# Patient Record
Sex: Female | Born: 1960
Health system: Southern US, Community
[De-identification: ages and names within clinical notes are randomized; demographics above are authoritative.]

## PROBLEM LIST (undated history)

## (undated) DIAGNOSIS — F419 Anxiety disorder, unspecified: Secondary | ICD-10-CM

## (undated) DIAGNOSIS — B001 Herpesviral vesicular dermatitis: Secondary | ICD-10-CM

## (undated) DIAGNOSIS — Z9109 Other allergy status, other than to drugs and biological substances: Secondary | ICD-10-CM

## (undated) DIAGNOSIS — M5126 Other intervertebral disc displacement, lumbar region: Secondary | ICD-10-CM

## (undated) DIAGNOSIS — L509 Urticaria, unspecified: Secondary | ICD-10-CM

## (undated) DIAGNOSIS — Z9049 Acquired absence of other specified parts of digestive tract: Secondary | ICD-10-CM

## (undated) DIAGNOSIS — F329 Major depressive disorder, single episode, unspecified: Secondary | ICD-10-CM

## (undated) DIAGNOSIS — F410 Panic disorder [episodic paroxysmal anxiety] without agoraphobia: Secondary | ICD-10-CM

## (undated) DIAGNOSIS — F32A Depression, unspecified: Secondary | ICD-10-CM

## (undated) DIAGNOSIS — Z9889 Other specified postprocedural states: Secondary | ICD-10-CM

## (undated) DIAGNOSIS — K439 Ventral hernia without obstruction or gangrene: Secondary | ICD-10-CM

## (undated) DIAGNOSIS — R32 Unspecified urinary incontinence: Secondary | ICD-10-CM

## (undated) DIAGNOSIS — Z8719 Personal history of other diseases of the digestive system: Secondary | ICD-10-CM

## (undated) DIAGNOSIS — R1310 Dysphagia, unspecified: Secondary | ICD-10-CM

## (undated) DIAGNOSIS — J45909 Unspecified asthma, uncomplicated: Secondary | ICD-10-CM

## (undated) HISTORY — DX: Unspecified asthma, uncomplicated: J45.909

## (undated) HISTORY — DX: Ventral hernia without obstruction or gangrene: K43.9

## (undated) HISTORY — DX: Urticaria, unspecified: L50.9

## (undated) HISTORY — DX: Other allergy status, other than to drugs and biological substances: Z91.09

## (undated) HISTORY — DX: Other specified postprocedural states: Z98.890

## (undated) HISTORY — DX: Anxiety disorder, unspecified: F41.9

## (undated) HISTORY — PX: DILATION AND CURETTAGE OF UTERUS: SHX78

## (undated) HISTORY — DX: Acquired absence of other specified parts of digestive tract: Z90.49

## (undated) HISTORY — PX: HYSTEROSCOPY: SHX211

## (undated) HISTORY — DX: Herpesviral vesicular dermatitis: B00.1

## (undated) HISTORY — DX: Unspecified urinary incontinence: R32

---

## 1999-01-06 ENCOUNTER — Ambulatory Visit (HOSPITAL_COMMUNITY): Admission: RE | Admit: 1999-01-06 | Discharge: 1999-01-06 | Payer: Self-pay | Admitting: Obstetrics and Gynecology

## 1999-03-30 ENCOUNTER — Ambulatory Visit (HOSPITAL_COMMUNITY): Admission: RE | Admit: 1999-03-30 | Discharge: 1999-03-30 | Payer: Self-pay | Admitting: Gynecology

## 1999-03-30 ENCOUNTER — Encounter (INDEPENDENT_AMBULATORY_CARE_PROVIDER_SITE_OTHER): Payer: Self-pay

## 1999-07-09 ENCOUNTER — Encounter: Payer: Self-pay | Admitting: Gynecology

## 1999-07-09 ENCOUNTER — Inpatient Hospital Stay (HOSPITAL_COMMUNITY): Admission: AD | Admit: 1999-07-09 | Discharge: 1999-07-09 | Payer: Self-pay | Admitting: Gynecology

## 1999-07-19 ENCOUNTER — Ambulatory Visit (HOSPITAL_COMMUNITY): Admission: RE | Admit: 1999-07-19 | Discharge: 1999-07-19 | Payer: Self-pay | Admitting: Gynecology

## 1999-07-19 ENCOUNTER — Encounter (INDEPENDENT_AMBULATORY_CARE_PROVIDER_SITE_OTHER): Payer: Self-pay

## 1999-08-15 HISTORY — PX: ABDOMINAL SURGERY: SHX537

## 2000-01-26 ENCOUNTER — Encounter: Admission: RE | Admit: 2000-01-26 | Discharge: 2000-01-26 | Payer: Self-pay | Admitting: Urology

## 2000-01-26 ENCOUNTER — Encounter: Payer: Self-pay | Admitting: Urology

## 2000-04-02 ENCOUNTER — Encounter: Admission: RE | Admit: 2000-04-02 | Discharge: 2000-04-02 | Payer: Self-pay | Admitting: *Deleted

## 2000-04-02 ENCOUNTER — Encounter: Payer: Self-pay | Admitting: *Deleted

## 2000-04-05 ENCOUNTER — Encounter: Payer: Self-pay | Admitting: Cardiology

## 2000-04-05 ENCOUNTER — Encounter: Payer: Self-pay | Admitting: *Deleted

## 2000-04-05 ENCOUNTER — Encounter: Admission: RE | Admit: 2000-04-05 | Discharge: 2000-04-05 | Payer: Self-pay | Admitting: Cardiology

## 2000-05-15 ENCOUNTER — Encounter (INDEPENDENT_AMBULATORY_CARE_PROVIDER_SITE_OTHER): Payer: Self-pay | Admitting: Specialist

## 2000-05-15 ENCOUNTER — Inpatient Hospital Stay (HOSPITAL_COMMUNITY): Admission: RE | Admit: 2000-05-15 | Discharge: 2000-05-17 | Payer: Self-pay | Admitting: Gynecology

## 2000-12-18 ENCOUNTER — Other Ambulatory Visit: Admission: RE | Admit: 2000-12-18 | Discharge: 2000-12-18 | Payer: Self-pay | Admitting: Gynecology

## 2001-08-14 HISTORY — PX: BLADDER SUSPENSION: SHX72

## 2003-06-03 ENCOUNTER — Other Ambulatory Visit: Admission: RE | Admit: 2003-06-03 | Discharge: 2003-06-03 | Payer: Self-pay | Admitting: Gynecology

## 2003-11-26 ENCOUNTER — Encounter (INDEPENDENT_AMBULATORY_CARE_PROVIDER_SITE_OTHER): Payer: Self-pay | Admitting: Specialist

## 2003-11-26 ENCOUNTER — Ambulatory Visit (HOSPITAL_BASED_OUTPATIENT_CLINIC_OR_DEPARTMENT_OTHER): Admission: RE | Admit: 2003-11-26 | Discharge: 2003-11-26 | Payer: Self-pay | Admitting: Gynecology

## 2003-11-26 ENCOUNTER — Ambulatory Visit (HOSPITAL_COMMUNITY): Admission: RE | Admit: 2003-11-26 | Discharge: 2003-11-26 | Payer: Self-pay | Admitting: Gynecology

## 2004-06-14 ENCOUNTER — Other Ambulatory Visit: Admission: RE | Admit: 2004-06-14 | Discharge: 2004-06-14 | Payer: Self-pay | Admitting: Gynecology

## 2005-06-27 ENCOUNTER — Other Ambulatory Visit: Admission: RE | Admit: 2005-06-27 | Discharge: 2005-06-27 | Payer: Self-pay | Admitting: Gynecology

## 2006-03-21 ENCOUNTER — Emergency Department: Payer: Self-pay | Admitting: Emergency Medicine

## 2006-07-23 ENCOUNTER — Other Ambulatory Visit: Admission: RE | Admit: 2006-07-23 | Discharge: 2006-07-23 | Payer: Self-pay | Admitting: Gynecology

## 2006-10-11 ENCOUNTER — Ambulatory Visit (HOSPITAL_BASED_OUTPATIENT_CLINIC_OR_DEPARTMENT_OTHER): Admission: RE | Admit: 2006-10-11 | Discharge: 2006-10-11 | Payer: Self-pay | Admitting: Gynecology

## 2006-10-11 ENCOUNTER — Encounter (INDEPENDENT_AMBULATORY_CARE_PROVIDER_SITE_OTHER): Payer: Self-pay | Admitting: Specialist

## 2007-07-26 ENCOUNTER — Other Ambulatory Visit: Admission: RE | Admit: 2007-07-26 | Discharge: 2007-07-26 | Payer: Self-pay | Admitting: Gynecology

## 2007-10-14 ENCOUNTER — Ambulatory Visit: Payer: Self-pay | Admitting: Internal Medicine

## 2008-04-30 ENCOUNTER — Ambulatory Visit: Payer: Self-pay | Admitting: Internal Medicine

## 2008-07-27 ENCOUNTER — Other Ambulatory Visit: Admission: RE | Admit: 2008-07-27 | Discharge: 2008-07-27 | Payer: Self-pay | Admitting: Gynecology

## 2008-07-27 ENCOUNTER — Ambulatory Visit: Payer: Self-pay | Admitting: Gynecology

## 2008-07-27 ENCOUNTER — Encounter: Payer: Self-pay | Admitting: Gynecology

## 2009-02-10 ENCOUNTER — Ambulatory Visit: Payer: Self-pay | Admitting: Internal Medicine

## 2009-07-28 ENCOUNTER — Ambulatory Visit: Payer: Self-pay | Admitting: Gynecology

## 2009-07-28 ENCOUNTER — Other Ambulatory Visit: Admission: RE | Admit: 2009-07-28 | Discharge: 2009-07-28 | Payer: Self-pay | Admitting: Gynecology

## 2009-09-17 ENCOUNTER — Ambulatory Visit: Payer: Self-pay | Admitting: Gynecology

## 2010-01-19 ENCOUNTER — Emergency Department: Payer: Self-pay | Admitting: Emergency Medicine

## 2010-08-01 ENCOUNTER — Ambulatory Visit: Payer: Self-pay | Admitting: Gynecology

## 2010-08-01 ENCOUNTER — Other Ambulatory Visit
Admission: RE | Admit: 2010-08-01 | Discharge: 2010-08-01 | Payer: Self-pay | Source: Home / Self Care | Admitting: Gynecology

## 2010-12-07 ENCOUNTER — Other Ambulatory Visit: Payer: Self-pay | Admitting: Gynecology

## 2010-12-07 DIAGNOSIS — Z1231 Encounter for screening mammogram for malignant neoplasm of breast: Secondary | ICD-10-CM

## 2010-12-13 ENCOUNTER — Ambulatory Visit
Admission: RE | Admit: 2010-12-13 | Discharge: 2010-12-13 | Disposition: A | Discharge status: Home / Self Care | Payer: Commercial Indemnity | Source: Ambulatory Visit | Attending: Gynecology | Admitting: Gynecology

## 2010-12-13 DIAGNOSIS — Z1231 Encounter for screening mammogram for malignant neoplasm of breast: Secondary | ICD-10-CM

## 2010-12-14 ENCOUNTER — Other Ambulatory Visit: Payer: Self-pay | Admitting: Gynecology

## 2010-12-14 DIAGNOSIS — R928 Other abnormal and inconclusive findings on diagnostic imaging of breast: Secondary | ICD-10-CM

## 2010-12-20 ENCOUNTER — Other Ambulatory Visit: Payer: Self-pay | Admitting: Gynecology

## 2010-12-20 ENCOUNTER — Ambulatory Visit
Admission: RE | Admit: 2010-12-20 | Discharge: 2010-12-20 | Disposition: A | Discharge status: Home / Self Care | Payer: Commercial Indemnity | Source: Ambulatory Visit | Attending: Gynecology | Admitting: Gynecology

## 2010-12-20 DIAGNOSIS — R928 Other abnormal and inconclusive findings on diagnostic imaging of breast: Secondary | ICD-10-CM

## 2010-12-21 ENCOUNTER — Ambulatory Visit
Admission: RE | Admit: 2010-12-21 | Discharge: 2010-12-21 | Disposition: A | Discharge status: Home / Self Care | Payer: Commercial Indemnity | Source: Ambulatory Visit | Attending: Gynecology | Admitting: Gynecology

## 2010-12-21 ENCOUNTER — Other Ambulatory Visit: Payer: Self-pay | Admitting: Gynecology

## 2010-12-21 ENCOUNTER — Other Ambulatory Visit: Payer: Self-pay | Admitting: Radiology

## 2010-12-21 DIAGNOSIS — R928 Other abnormal and inconclusive findings on diagnostic imaging of breast: Secondary | ICD-10-CM

## 2010-12-21 DIAGNOSIS — N6310 Unspecified lump in the right breast, unspecified quadrant: Secondary | ICD-10-CM

## 2010-12-21 HISTORY — PX: BREAST BIOPSY: SHX20

## 2010-12-30 NOTE — Op Note (Signed)
[facility]  Patient:    Hannah [name], Hannah [name]                     MRN: [id number] Proc. Date: 05/15/00 Adm. Date:  [id number] Attending:  Merrily Pew                           Operative Report  PREOPERATIVE DIAGNOSIS:       Leiomyomata.  POSTOPERATIVE DIAGNOSIS:      Leiomyomata.  OPERATION:                    Multiple myomectomy.  SURGEON:                      Timothy P. Fontaine, M.D.  ASSISTANT:                    Douglass Rivers, M.D.  ANESTHESIA:                   General endotracheal anesthesia.  ESTIMATED BLOOD LOSS:         Less than 50 cc.  COMPLICATIONS:                None.  SPECIMEN:                     Leiomyomata x 3.  FINDINGS:                     Anterior cul-de-sac normal.  Posterior cul-de-sac normal.  Uterus overall normal size.  Large fundal pedunculated myoma 9 to 10 cm.  A 3 to 4 cm left broad ligament.  Myoma inferior to fallopian tube.  Small, 1 to 2 cm, pedunculated posterior myoma.  Right and left fallopian tubes normal length, caliber and fimbriated ends.  Right and left ovaries grossly normal, free and mobile.  No evidence of pelvic adhesions or endometriosis.  DESCRIPTION OF PROCEDURE:     The patient was taken to the operating room and underwent general endotracheal anesthesia.  She received an abdominal preparation with Betadine scrub and Betadine solution and a Foley catheter was placed in sterile technique.  The patient was draped in the usual fashion and the abdomen was sharply entered through a Pfannenstiel incision, achieving adequate hemostasis at all levels.  The Balfour retractor and bladder blade were placed within the incision and the intestines were packed from the operative field.  The uterus was elevated out of the pelvis and the large fundal pedunculated myoma was then injected into the myoma stock using vasopressin mixture in 10 units per 20 cc dilution.  Subsequently the overlying serosa  was sharply incised and through sharp and blunt dissection, the myoma was excised.  The uterine defect was then closed using 0 Vicryl interrupted sutures to reapproximated the myometrium and subsequent 2-0 PDS suture in a running imbricating suture to reapproximate the serosa.  The left broad ligament myoma was then exposed underlying the round ligament and inferior to the fallopian tube insertion.  The anterior lower surface was infiltrated using the same vasopressin mixture and subsequently the overlying peritoneum was sharply incised.  Through sharp and blunt dissection, the myoma was then delivered without difficulty and several bleeders were dressed with interrupted 0 Vicryl stitch.  Subsequently the overlying peritoneum was reapproximated using 3-0 PDS in a running imbricating stitch. The small posterior myoma was then tented  and excised with electrocautery requiring no sutures.  The pelvis was copiously irrigated.  reinspection of all sites showed adequate hemostasis.  Bowel packing removed. Balfour retractor and bladder blade were removed and the anterior fascia reapproximated using 0 Vicryl in a running stitch starting at the angle and meeting in the middle. Subcutaneous tissues were irrigated.  Hemostasis was achieved with electrocautery and the skin was subsequently reapproximated using 4-0 Vicryl in a running subcuticular stitch.  Steri-Strips with benzoin was then applied. Sterile dressing applied.  The patient was awakened without difficulty and was taken to the recovery room in good condition having tolerated the procedure well.  Of note, all the dissections were far from the tubal insertions and the endometrial cavity was not entered in any of the dissections. DD:  05/15/00 TD:  05/15/00 Job: [id number]

## 2010-12-30 NOTE — H&P (Signed)
[facility] of [facility]  Patient:    Hannah [name], Hannah [name]                       MRN: [id number] Adm. Date:  05/15/00 Attending:  Marcial Pacas P. Fontaine, M.D.                         History and Physical  CHIEF COMPLAINT:                Leiomyomata.  HISTORY OF PRESENT ILLNESS:     [age]-year-old G4, P1, AB3 female with a history of symptomatic leiomyomata.  The patient has a history of increasing pelvic pressure, low back pain and discomfort as well as urinary frequency, pressure sensation and a history of there spontaneous ABs.  Ultrasonography shows a large 9 cm fundal myoma as well as a smaller 35 mm myoma.  The patient is admitted at this time for myomectomy.  PAST MEDICAL HISTORY:           Anxiety disorder.  PAST SURGICAL HISTORY:          Hysteroscopy D&C.  D&C x 2.  CURRENT MEDICATIONS:            None.  ALLERGIES:                      None.  SOCIAL HISTORY:                 Significant for 1/2 pack per day cigarettes. Alcohol social.  REVIEW OF SYSTEMS:              Noncontributory.  FAMILY HISTORY:                 Noncontributory.  PHYSICAL EXAMINATION:  HEENT:                          Normal.  LUNGS:                          Clear.  CARDIAC:                        Regular rate without rubs, murmurs or gallops.  ABDOMEN:                        Benign.  PELVIC:                         External, BUS, vagina normal.  Cervix grossly normal.  Uterus bulky, enlarged.  Adnexa without masses or tenderness.  ASSESSMENT:                     [age]-year-old G4, P1, AB3 female with history of increasing pelvic pressure, low back pain, frequency and recurrent ABs, large myomas for abdominal myomectomy.  Risks, benefits, indications and alternatives for the procedure were reviewed with the patient and the expected intraoperative and postoperative courses were discussed with her.  The patient desires future pregnancies and we discussed the issues  as far as far the myomectomy and future pregnancies, and the patient and her husband understand there are no guarantees that the myomectomy will help with any of her symptoms, particularly with carrying future pregnancies as well as her low back pain and the urinary complaints. The patient has been  seen by Dr. Morrison Old and Dr. Jethro Bolus, and both concur with the procedure.  The risks of pregnancies following myomectomy were also discussed and the risks of uterine rupture reviewed with her.  The recommendations for subsequent C-sections with all subsequent deliveries was also discussed, if indeed, a significant uterine incision is required.  The patient also understands that anytime during the procedure due to complications of the procedure we may to need to proceed with hysterectomy and this would obviously leave her irreversibly sterile and she understands and accepts this.  The patient also understands that following the procedure, she may have difficulties obtaining a pregnancy as a result of the surgery itself, and the patient understands and accepts this.  The intraoperative and postoperative risks were discussed including inadvertent injury to internal organs including bowel, bladder, ureters, vessels and nerves necessitating major reparative surgeries and future reparative surgery including ostomy formation.  The risks of infection both internal as well as incisional and abscess requiring opening and draining of incisions and abscess formations as well as prolonged antibiotics was all discussed, understood and accepted.  The realistic risk of transfusion with myomectomy was discussed with her.  Given the bloody nature of the procedure, the options for autologous blood was discussed and offered and she rejects this.  I discussed the risks of blood transfusion to include HIV, blood transfusion reactions as well as hepatitis and other infections, and the patient  understands and accepts these risks and does not want to arrange for autologous blood donation.  The patients questions were all answered to her satisfaction and she is ready to proceed with surgery. DD:  05/14/00 TD:  05/14/00 Job: [id number]

## 2010-12-30 NOTE — Op Note (Signed)
[facility]  Patient:    Hannah [name]                      MRN: [id number] Proc. Date: 07/19/99 Adm. Date:  [id number] Attending:  Merrily Pew                           Operative Report  PREOPERATIVE DIAGNOSIS:       Missed abortion.  POSTOPERATIVE DIAGNOSIS:      Missed abortion.  OPERATION:                    Suction dilatation and curettage.  SURGEON:                      Timothy P. Fontaine, M.D.  ASSISTANT:  ANESTHESIA:                   IV sedation and 1% lidocaine paracervical block.  ESTIMATED BLOOD LOSS:         Minimal.  COMPLICATIONS:                None.  SPECIMEN:                     POC for histologic evaluation.  POC for chromosomal evaluation.  FINDINGS:                     Uterus anteverted, bulky consistent with history f leiomyomata.  Gross POC retrieved with a visible gestational sac.  Cavity irregular to sharp curettage.  DESCRIPTION OF PROCEDURE:     The patient was taken to the operating room and underwent IV sedation and was placed in the low dorsal lithotomy position and received a perineal and vaginal preparation with Betadine solution.  Bladder emptied with in-and-out Foley catheterization and EUA performed.  The patient was draped in the usual fashion.  The cervix grasped with a single tooth tenaculum nd a paracervical block using 1% lidocaine was placed 5 cc per side.  The patients  cervix was gently and gradually dilated to admit the #7 suction curet and the suction curettage was performed.  There was scant return initially.  A gentle sharp curettage was then performed with actual retrieval of a gestational sac and a resuction curettage performed.  The endometrial cavity was noted to be somewhat  irregular with the sac being located on the patients right consistent with leiomyomata distorting endometrial cavity.  The tenaculum was removed. Adequate hemostasis was visualized.  The patient  was placed in the supine position and taken to the recovery room in good condition having tolerated the procedure well.  A portion of the gestational sac was sent for chromosomal analysis and the remainder for histologic evaluation.  The patients blood type is A positive. DD:  07/19/99 TD:  07/20/99 Job: [id number]

## 2010-12-30 NOTE — Op Note (Signed)
[name], Hannah [name]                        ACCOUNT NO.:  [id number]   MEDICAL RECORD NO.:  [id number]                   PATIENT TYPE:  AMB   LOCATION:  [facility]                                 FACILITY:  [facility]   PHYSICIAN:  Timothy P. Fontaine, M.D.           DATE OF BIRTH:  [birth year]   DATE OF PROCEDURE:  11/26/2003  DATE OF DISCHARGE:                                 OPERATIVE REPORT   PREOPERATIVE DIAGNOSES:  1. Menorrhagia.  2. Irregular bleeding.   POSTOPERATIVE DIAGNOSIS:  Endometrial polyp.   PROCEDURE:  1. Hysteroscopy.  2. D&C.  3. Resection of endometrial polyp.   SURGEON:  Timothy P. Fontaine, M.D.   ANESTHETIC:  General.   ESTIMATED BLOOD LOSS:  Minimal.   COMPLICATIONS:  None.   SORBITOL DISCREPANCY:  75 mL.   SPECIMEN:  1. Endometrial curetting.  2. Endometrial polyp.   FINDINGS:  Long endometrial polyp from fundus to lower uterine segment,  removed.  Hysteroscopy was otherwise normal, although noting tubal ostia not  visualized bilaterally.  Fundus, anterior-posterior uterine surfaces, lower  uterine segment, endocervical canal all visualized.   DESCRIPTION OF PROCEDURE:  The patient was taken to the operating room,  underwent general anesthesia, was placed in low dorsal lithotomy position,  received a perineal and vaginal preparation with Betadine solution, bladder  emptied with in-and-out Foley catheterization, and an EUA was performed.  The patient was draped in the usual fashion, the cervix visualized with a  speculum, anterior lip grasped with a single-tooth tenaculum, and the cervix  was gently gradually dilated to admit the operative hysteroscope.  Hysteroscopy was performed with findings noted above.  Due to the anatomy,  the right-angle loop was not able to achieve an adequate clearance to be  able to resect the polyp, and polyp forceps were introduced, and the polyp  was grossly removed.  A sharp curettage was then performed, and these two  specimens were sent separately.  Re-hysteroscopy showed an empty cavity,  good  distention, no evidence of perforation but due to the curetting, I was  unable to clearly define the ostia bilaterally.  The instruments were  removed after adequate hemostasis was visualized.  The patient was then  awaiting Dr. Patsi Sears for the urologic sling procedure.                                               Timothy P. Audie Box, M.D.    TPF/MEDQ  D:  11/26/2003  T:  11/26/2003  Job:  [id number]

## 2010-12-30 NOTE — H&P (Signed)
Hannah [name], Hannah [name]                        ACCOUNT NO.:  [id number]   MEDICAL RECORD NO.:  [id number]                   PATIENT TYPE:  AMB   LOCATION:  [facility]                                 FACILITY:  [facility]   PHYSICIAN:  Timothy P. Fontaine, M.D.           DATE OF BIRTH:  04-13-1961   DATE OF ADMISSION:  11/26/2003  DATE OF DISCHARGE:                                HISTORY & PHYSICAL   Patient is being operated on at [facility] on April 14th.   CHIEF COMPLAINT:  1. Irregular bleeding.  2. Urinary incontinence.   HISTORY OF PRESENT ILLNESS:  A 50 year old G78, P54, AB3 female with a history  of increasing menorrhagia.  Underwent sonohistogram which shows two  intracavitary defects, consistent with polyps, for hysteroscopy and  resection.  Patient has been followed by Dr. Patsi Sears for urinary  incontinence and is also admitted for a sling procedure following the  hysteroscopy.   PAST MEDICAL HISTORY:  Anxiety.   PAST SURGICAL HISTORY:  1. Hysteroscopy.  2. D&C x2.  3. Abdominal myomectomy.   CURRENT MEDICATIONS:  Include Prozac, Xanax.   ALLERGIES:  None.   REVIEW OF SYSTEMS:  Noncontributory.   SOCIAL HISTORY:  Significant for 1-5 cigarettes per day.   FAMILY HISTORY:  Noncontributory.   PHYSICAL EXAMINATION:  VITAL SIGNS:  Afebrile.  Vital signs are stable.  HEENT:  Normal.  LUNGS:  Clear.  HEART:  Regular rate.  No murmurs, rubs or gallops.  ABDOMEN:  Benign.  PELVIC:  External BUS, vagina normal.  Cervix grossly normal.  Uterus normal  size.  Nontender.  Adnexa without masses or tenderness.   ASSESSMENT:  A 50 year old G44, P62, AB3 female with increasing menorrhagia.  Ultrasound shows intracavitary defects, consistent with polyps.  For  hysteroscopy and resection.  I reviewed the proposed surgery about the  intraoperative and postoperative courses and the risks, to include bleeding,  transfusion, infection, uterine perforation, damage to  internal organs,  including bowel, bladder, ureters, vessels, and nerves, necessitating major  exploratory or reparative surgeries or future reparative surgeries,  including ostomy formation.  Patient understands no guarantees as far as  bleeding relief were made.  She understands and accepts this.  She also  understands that Dr. Patsi Sears is going to be doing her urologic procedure  and that he is in charge as far as her bladder surgery and her postoperative  bladder care.                                               Timothy P. Audie Box, M.D.    TPF/MEDQ  D:  11/25/2003  T:  11/25/2003  Job:  [id number]

## 2010-12-30 NOTE — H&P (Signed)
[facility]  Patient:    Hannah [name]                      MRN: [id number] Adm. Date:  [id number] Attending:  Merrily Pew                         History and Physical  CHIEF COMPLAINT:              Missed Ab.  HISTORY OF PRESENT ILLNESS:   [age]-year-old G4, P1, Ab3 female, history of slowly rising beta hCG values and inappropriate growth of gestational sac, admitted for suction D&C for missed Ab.  PAST MEDICAL HISTORY:         Anxiety disorder.  PAST SURGICAL HISTORY:        D&C x 1, hysteroscopy/D&C.  ALLERGIES:                    None.  MEDICATIONS:                  1. Prozac 20 mg b.i.d.                               2. Ambien q.h.s. p.r.n.  SOCIAL HISTORY:               Cigarettes:  Pack per day.  Alcohol:  Social.  REVIEW OF SYSTEMS:            Noncontributory.  FAMILY HISTORY:               Noncontributory.  PHYSICAL EXAMINATION:  VITAL SIGNS:                  Afebrile.  Vital signs are stable.  HEENT:                        Normal.  LUNGS:                        Clear.  CARDIAC:                      Regular rate.  Without rubs, murmurs or gallops.  ABDOMEN:                      Benign.  PELVIC:                       External, BUS and vagina normal.  Cervix normal.  Uterus bulky, enlarged.  Adnexa without masses or tenderness.  ASSESSMENT:                   [age]-year-old gravida 5, para 1, abortus 3 female, followed with inappropriate rises in beta hCG values, small 8.4-mm fluid-filled gestational sac inappropriately growing on serial ultrasounds, for  suction, dilatation and curettage.  Risks, benefits, indications and alternatives for the procedure were discussed with the patient to include the risks of bleeding, transfusion, infection, uterine perforation, damage to internal organs including bowel, bladder, ureters, vessels and nerves necessitating a major exploratory or reparative surgery.   Patient understands and accepts and wants to proceed with surgery.  Patients blood type is A-positive. DD:  07/19/99 TD:  07/19/99 Job: [id number]

## 2010-12-30 NOTE — Discharge Summary (Signed)
[facility] of [facility]  Patient:    Hannah [name], Hannah [name]                     MRN: [id number] Adm. Date:  [id number] Disc. Date: [id number] Attending:  Merrily Pew Dictator:   Antony Contras, [facility]                           Discharge Summary  DISCHARGE DIAGNOSIS:          Leiomyomata.  PROCEDURE:                    Multiple myomectomy.  HISTORY OF PRESENT ILLNESS:   The patient is a 50 year old, gravida 4, para 1, AB 3, with a history of symptomatic leiomyomata and has noted increasing pelvic pressure, low back pain, and discomfort as well as urinary frequency, and she has also had three spontaneous ABs.  She does desire future pregnancies and has been counseled concerning the possibilities and also the risks associated with pregnancy after myomectomy.  She has also been seen by Morrison Old and Dr. Jethro Bolus who both concur with the procedure.  HOSPITAL COURSE:              The patient was admitted May 15, 2000.  Her multiple myomectomy was performed by Dr. Audie Box assisted by Dr. Farrel Gobble under general anesthesia.  Findings revealed a large fundal leiomyomata, 9-10 cm, small 1-2 cm posterior.  Also, a 3-4 cm left broad ligament.  Myoma inferior to the fallopian tube, otherwise normal tubes and ovaries. Postoperative course:  The patient postoperatively did complain of some nausea and gas, but she remained afebrile and had no difficulty voiding.  CBC: Hematocrit was 33.5, hemoglobin 11.7, WBC 12.1, platelets 249.  She was able to be discharged on her second postoperative day in satisfactory condition.  DISPOSITION/FOLLOWUP:         The patient is to be followed up in the office in two weeks.  DISCHARGE MEDICATIONS:        Was given Tylox for pain. DD:  06/01/00 TD:  06/02/00 Job: [id number]

## 2010-12-30 NOTE — Op Note (Signed)
[name], [name]              ACCOUNT NO.:  [id number]   MEDICAL RECORD NO.:  [id number]          PATIENT TYPE:  AMB   LOCATION:  [facility]                         FACILITY:  [facility]   PHYSICIAN:  Timothy P. Fontaine, M.D.DATE OF BIRTH:  03/04/1961   DATE OF PROCEDURE:  10/11/2006  DATE OF DISCHARGE:                               OPERATIVE REPORT   PREOPERATIVE DIAGNOSIS:  Endometrial polyp.   POSTOPERATIVE DIAGNOSIS:  Endometrial polyp.   PROCEDURE:  Hysteroscopy D&C, polypectomy.   SURGEON:  Timothy P. Fontaine, M.D.   ANESTHETIC:  MAC with paracervical 1% lidocaine block.   COMPLICATIONS:  None.   ESTIMATED BLOOD LOSS:  Minimal.   SORBITOL DISCREPANCY:  Minimal.   SPECIMENS:  1. Endometrial curetting.  2. Endometrial polyp fragments.   FINDINGS:  EUA:  External B U S vagina normal.  Cervix normal, uterus  normal size, midline mobile.  Adnexa without masses.  Hysteroscopic:  Finger-like polyp from anterior mid uterine surface excised at the base.  Hysteroscopy otherwise normal noting fundus, anterior-posterior  surfaces, lower uterine segment, endocervical canal all visualized.  Right and left cornual regions visualized.   PROCEDURE:  The patient was taken to the operating room, underwent  general anesthesia, received a perineal vaginal preparation with  Betadine solution.  Bladder emptied with in-and-out Foley  catheterization.  The patient was draped in usual fashion per nursing  personnel.  An EUA was performed.  Cervix visualized with a weighted  speculum, anterior lip grasped with single-tooth tenaculum and a  paracervical block using 1% lidocaine total of 10 mL was placed.  The  cervix was then gently dilated, the diagnostic hysteroscope placed, the  polyp visualized.  Due to the fragile nature of the polyp, a single pass  anterior sharp curettage was performed with removal of the polyp  apparently intact.  Cervix was then continually dilated to admit the  operative hysteroscope and hysteroscopy was performed.  Remaining small  fragments were excised to the level of surrounding endometrium using the  right-angle resectoscopic loop and sent with the polyp.  A sharp  curettage was then performed and sent as a separate specimen.  Rehysteroscopy showed an empty cavity, good distension, no evidence of  perforation.  The instruments were  removed.  Adequate hemostasis visualized from the tenaculum site and  from the cervix.  The patient placed in supine position, awakened  without difficulty and taken to recovery room in good condition having  tolerated procedure well.      Timothy P. Fontaine, M.D.  Electronically Signed     TPF/MEDQ  D:  10/11/2006  T:  10/11/2006  Job:  [id number]

## 2010-12-30 NOTE — H&P (Signed)
NAMEJAZLEN, Hannah [name]              ACCOUNT NO.:  [id number]   MEDICAL RECORD NO.:  [id number]          PATIENT TYPE:  AMB   LOCATION:  [facility]                         FACILITY:  [facility]   PHYSICIAN:  Timothy P. Fontaine, M.D.DATE OF BIRTH:  [birth year]   DATE OF ADMISSION:  DATE OF DISCHARGE:                              HISTORY & PHYSICAL   [facility].   OUTPATIENT SURGERY:  February 28 at 7:30 a.m.   CHIEF COMPLAINT:  Intermenstrual bleeding.   HISTORY OF PRESENT ILLNESS:  A 50 year old G37, P69, AB 3 female,  vasectomy birth control who presented complaining of intermenstrual  bleeding.  The patient underwent a sono histogram which showed a 10 x 8  x 4 mm endometrial defect consistent with a polyp; and she is admitted,  at this time, for hysteroscopy, polypectomy, D&C.  She does have a past  history of endometrial polyps as well as uterine myomas.   PAST MEDICAL HISTORY:  Uncomplicated.   PAST SURGICAL HISTORY INCLUDES:  1. Abdominal myomectomy.  2. Hysteroscopy.  3. D&C x2  4. Sling bladder surgery.   CURRENT MEDICATIONS:  None.   ALLERGIES:  No medications.   REVIEW OF SYSTEMS:  Noncontributory.   FAMILY HISTORY:  Noncontributory.   SOCIAL HISTORY:  Noncontributory.   PHYSICAL EXAM:  VITAL SIGNS:  Afebrile.  Vital signs are stable  HEENT:  Normal.  LUNGS:  Clear.  CARDIAC:  Regular rate with no rubs, murmurs or gallops.  ABDOMINAL EXAM:  Benign.  PELVIC:  External BUS, vagina normal, cervix normal.  Uterus normal  size, midline and mobile, nontender.  Adnexa without masses or  tenderness.   ASSESSMENT:  A 50 year old G65, P49, AB 3 female, vasectomy birth control,  recurrent intermenstrual spotting--sono histogram consistent with  endometrial polyp for hysteroscopy D&C.  I reviewed the proposed surgery  with the patient the expected intraoperative, postoperative courses, use  of the hysteroscope, resectoscope, dilatation, and sharp curettage.  The  risks of infection antibiotics, hemorrhage necessitating transfusion,  and the risks of transfusion were all discussed, understood, and  accepted.  The risk of uterine perforation, damage to internal organs  including bowel, bladder, ureters, vessels, and nerves necessitating  major exploratory reparative surgeries and future reparative surgeries  including intestinal resection as well as ostomy formation was all  either immediately recognized or delay recognized was all discussed,  understood, and accepted.  The issues of distended medium and distended  media absorption leading to metabolic complications, seizure, coma, as  was all discussed, understood, and accepted.  The patient's questions  were answered to her satisfaction.  She is ready to proceed with  surgery.      Timothy P. Fontaine, M.D.  Electronically Signed     TPF/MEDQ  D:  10/08/2006  T:  10/08/2006  Job:  [id number]

## 2010-12-30 NOTE — Op Note (Signed)
Hannah [name], Hannah [name]                        ACCOUNT NO.:  [id number]   MEDICAL RECORD NO.:  [id number]                   PATIENT TYPE:  AMB   LOCATION:  [facility]                                 FACILITY:  [facility]   PHYSICIAN:  Sigmund I. Patsi Sears, M.D.         DATE OF BIRTH:  09/20/1960   DATE OF PROCEDURE:  11/26/2003  DATE OF DISCHARGE:                                 OPERATIVE REPORT   PREOPERATIVE DIAGNOSIS:  Urinary incontinence.   POSTOPERATIVE DIAGNOSIS:  Urinary incontinence.   OPERATION:  Mentor transobturator pubovaginal sling.   SURGEON:  Sigmund I. Patsi Sears, M.D.   ANESTHESIA:  General endotracheal.   PROCEDURE:  With the patient in the lithotomy position, following  hysteroscopy and polyp removal, the pubis was prepped and draped in the  usual fashion.  A posterior weighted speculum was placed.  A Foley catheter  was placed.  Marcaine plain 10 cc was injected into the periurethral tissue.  A 2 cm incision was then made over the urethra, and subcutaneous tissue  dissected with sharp and blunt dissection to the level of the endopelvic  fascia.  Two stab wounds were then made 5 cm lateral to the clitoris.  The  Mentor transobturator tape was then placed in a standard fashion.  A right  angle was used for tensioning in standard fashion.   Cystoscopy revealed no evidence of any bladder abnormality.  The bladder was  drained of fluid with a Foley catheter.  The tails of the sling cut  subcutaneously.  The wound was then closed in two layers with interrupted 3-  0 Vicryl suture and running 3-0 Vicryl suture.  The stab wounds were then  closed in a standard fashion with Dermabond.  The patient was awakened and  taken to the recovery room in good condition.                                               Sigmund I. Patsi Sears, M.D.    SIT/MEDQ  D:  11/26/2003  T:  11/26/2003  Job:  [id number]   cc:   Marcial Pacas P. Audie Box, M.D.  [address], [address]   [place]  [place] [address]  Fax: [phone]

## 2011-04-10 ENCOUNTER — Ambulatory Visit: Payer: Self-pay

## 2011-08-10 ENCOUNTER — Encounter: Payer: Self-pay | Admitting: Gynecology

## 2011-08-10 ENCOUNTER — Ambulatory Visit (INDEPENDENT_AMBULATORY_CARE_PROVIDER_SITE_OTHER): Payer: Commercial Indemnity | Admitting: Gynecology

## 2011-08-10 VITALS — BP 120/78 | Ht 64.0 in | Wt 146.0 lb

## 2011-08-10 DIAGNOSIS — N898 Other specified noninflammatory disorders of vagina: Secondary | ICD-10-CM

## 2011-08-10 DIAGNOSIS — N926 Irregular menstruation, unspecified: Secondary | ICD-10-CM

## 2011-08-10 DIAGNOSIS — Z13220 Encounter for screening for lipoid disorders: Secondary | ICD-10-CM

## 2011-08-10 DIAGNOSIS — B373 Candidiasis of vulva and vagina: Secondary | ICD-10-CM

## 2011-08-10 DIAGNOSIS — Z131 Encounter for screening for diabetes mellitus: Secondary | ICD-10-CM

## 2011-08-10 DIAGNOSIS — R82998 Other abnormal findings in urine: Secondary | ICD-10-CM

## 2011-08-10 DIAGNOSIS — Z01419 Encounter for gynecological examination (general) (routine) without abnormal findings: Secondary | ICD-10-CM

## 2011-08-10 DIAGNOSIS — B3731 Acute candidiasis of vulva and vagina: Secondary | ICD-10-CM

## 2011-08-10 NOTE — Patient Instructions (Signed)
Follow for hormone study results otherwise keep menstrual calendar and as long as menses are every other to every month we'll follow. To go longer than 8 weeks without a menstrual period they need to call.

## 2011-08-10 NOTE — Progress Notes (Signed)
Hannah [name] 09-11-1960 [id number]        50 y.o.  for annual exam.  Overall doing well but does note some low libido. Also notes her menses come every 6-8 weeks no intermenstrual bleeding. Vasectomy birth control.  Past medical history,surgical history, medications, allergies, family history and social history were all reviewed and documented in the EPIC chart. ROS:  Was performed and pertinent positives and negatives are included in the history.  Exam: chaperone present Filed Vitals:   08/10/11 1536  BP: 120/78   General appearance  Normal Skin grossly normal Head/Neck normal with no cervical or supraclavicular adenopathy thyroid normal Lungs  clear Cardiac RR, without RMG Abdominal  soft, nontender, without masses, organomegaly or hernia Breasts  examined lying and sitting without masses, retractions, discharge or axillary adenopathy. Pelvic  Ext/BUS/vagina  normal with white discharge noted  Cervix  normal    Uterus  retroverted, normal size, shape and contour, midline and mobile nontender   Adnexa  Without masses or tenderness    Anus and perineum  normal   Rectovaginal  normal sphincter tone without palpated masses or tenderness.    Assessment/Plan:  50 y.o. female for annual exam.    1. White discharge. Wet prep positive for yeast. We'll treat with Diflucan 150x1 dose, follow up if symptoms develop. 2. Irregular menses.  Menses every 6-8 weeks. Check baseline TSH, FSH, prolactin. Otherwise will keep menstrual calendar. If she ever goes longer than 8 weeks she knows to call for progesterone withdrawal. Patient will follow up for her hormone studies results. 3. History of leiomyoma. Her uterus overall is upper limits of normal size. Menses are acceptable, no intermenstrual bleeding and will plan on expected management. 4. Pap smear. Patient has no history of abnormal Pap smears in the past with last Pap smear 2011. She has numerous normal reports in her chart. I reviewed  current screening guidelines and recommended less frequent screening at 3 year intervals and she agrees with this. No Pap was done today.  5. Breasts health. She recently had stereotactic biopsy showing fibrocystic changes in May 2012 and they recommended repeat in one year she will follow up this coming May for mammogram. SBE monthly reviewed. 6. Decreased libido. I reviewed the issue of decreased libido and I think that this is normal for her. Various strategies were discussed. 7. Health maintenance. Baseline CBC, glucose, lipid profile, urinalysis was ordered in addition to her above hormone studies. Assuming she continues well from a gynecologic standpoint and has regular menses every month to every other month and we will follow.    Dara Lords MD, 4:44 PM 08/10/2011

## 2011-08-10 NOTE — Progress Notes (Signed)
Addended by: Cammie Mcgee T on: 08/10/2011 05:22 PM   Modules accepted: Orders

## 2011-08-11 ENCOUNTER — Encounter: Payer: Self-pay | Admitting: *Deleted

## 2011-08-11 LAB — CBC WITH DIFFERENTIAL/PLATELET
Eosinophils Relative: 1 % (ref 0–5)
HCT: 37.8 % (ref 36.0–46.0)
Hemoglobin: 12.6 g/dL (ref 12.0–15.0)
Lymphocytes Relative: 31 % (ref 12–46)
Lymphs Abs: 2.6 10*3/uL (ref 0.7–4.0)
MCV: 88.9 fL (ref 78.0–100.0)
Monocytes Absolute: 0.9 10*3/uL (ref 0.1–1.0)
Monocytes Relative: 11 % (ref 3–12)
RBC: 4.25 MIL/uL (ref 3.87–5.11)
WBC: 8.4 10*3/uL (ref 4.0–10.5)

## 2011-08-11 NOTE — Progress Notes (Signed)
Faxed Health Assessment sheet back to patient per her request.

## 2011-08-14 ENCOUNTER — Telehealth: Payer: Self-pay | Admitting: *Deleted

## 2011-08-14 MED ORDER — FLUCONAZOLE 150 MG PO TABS: 150.0000 mg | ORAL_TABLET | Freq: Once | ORAL | Status: AC

## 2011-08-14 NOTE — Telephone Encounter (Signed)
Pt called stating pharmacy never got diflucan rx, per TF office note diflucan will be sent to pharmacy. Pt informed with this as well.

## 2012-06-19 ENCOUNTER — Other Ambulatory Visit: Payer: Self-pay | Admitting: Gynecology

## 2012-06-19 DIAGNOSIS — Z1231 Encounter for screening mammogram for malignant neoplasm of breast: Secondary | ICD-10-CM

## 2012-06-27 ENCOUNTER — Ambulatory Visit: Payer: Self-pay | Admitting: Internal Medicine

## 2012-07-22 ENCOUNTER — Ambulatory Visit
Admission: RE | Admit: 2012-07-22 | Discharge: 2012-07-22 | Disposition: A | Discharge status: Home / Self Care | Payer: BC Managed Care – PPO | Source: Ambulatory Visit | Attending: Gynecology | Admitting: Gynecology

## 2012-07-22 DIAGNOSIS — Z1231 Encounter for screening mammogram for malignant neoplasm of breast: Secondary | ICD-10-CM

## 2012-08-12 ENCOUNTER — Ambulatory Visit (INDEPENDENT_AMBULATORY_CARE_PROVIDER_SITE_OTHER): Payer: BC Managed Care – PPO | Admitting: Gynecology

## 2012-08-12 ENCOUNTER — Encounter: Payer: Self-pay | Admitting: Gynecology

## 2012-08-12 VITALS — BP 128/74 | Ht 63.5 in | Wt 143.0 lb

## 2012-08-12 DIAGNOSIS — D259 Leiomyoma of uterus, unspecified: Secondary | ICD-10-CM

## 2012-08-12 DIAGNOSIS — N926 Irregular menstruation, unspecified: Secondary | ICD-10-CM

## 2012-08-12 DIAGNOSIS — R3915 Urgency of urination: Secondary | ICD-10-CM

## 2012-08-12 DIAGNOSIS — Z13220 Encounter for screening for lipoid disorders: Secondary | ICD-10-CM

## 2012-08-12 DIAGNOSIS — Z01419 Encounter for gynecological examination (general) (routine) without abnormal findings: Secondary | ICD-10-CM

## 2012-08-12 LAB — CBC WITH DIFFERENTIAL/PLATELET
Basophils Relative: 1 % (ref 0–1)
Eosinophils Absolute: 0.3 10*3/uL (ref 0.0–0.7)
Eosinophils Relative: 4 % (ref 0–5)
HCT: 42.2 % (ref 36.0–46.0)
Hemoglobin: 14 g/dL (ref 12.0–15.0)
Lymphs Abs: 2.9 10*3/uL (ref 0.7–4.0)
MCH: 29.5 pg (ref 26.0–34.0)
MCHC: 33.2 g/dL (ref 30.0–36.0)
MCV: 88.8 fL (ref 78.0–100.0)
Monocytes Absolute: 0.7 10*3/uL (ref 0.1–1.0)
Monocytes Relative: 9 % (ref 3–12)
Neutrophils Relative %: 48 % (ref 43–77)
RBC: 4.75 MIL/uL (ref 3.87–5.11)

## 2012-08-12 NOTE — Progress Notes (Signed)
Hannah [name] 03/31/1961 [id number]        51 y.o.  [id number] for annual exam.  Several issues noted below.  Past medical history,surgical history, medications, allergies, family history and social history were all reviewed and documented in the EPIC chart. ROS:  Was performed and pertinent positives and negatives are included in the history.  Exam: Charity fundraiser Vitals:   08/12/12 1555  BP: 128/74  Height: 5' 3.5" (1.613 m)  Weight: 143 lb (64.864 kg)   General appearance  Normal Skin grossly normal Head/Neck normal with no cervical or supraclavicular adenopathy thyroid normal Lungs  clear Cardiac RR, without RMG Abdominal  soft, nontender, without masses, organomegaly or hernia Breasts  examined lying and sitting without masses, retractions, discharge or axillary adenopathy. Pelvic  Ext/BUS/vagina  normal   Cervix  normal   Uterus  retroverted, normal size, mild irregular contour, midline and mobile nontender   Adnexa  Without masses or tenderness    Anus and perineum  normal   Rectovaginal  normal sphincter tone without palpated masses or tenderness.    Assessment/Plan:  51 y.o. G42P1021 female for annual exam, vasectomy birth control, last menstrual period August 2013..   1. Irregular menses/menopausal symptoms. Patient's last menstrual period August 2013. Patient now has hot flashes primary late-night to start in the chest area and radiate to her neck. Did have history of less frequent menses last year but her FSH was normal. We'll recheck [facility] now at this point plan expectant management. Options for her hot flashes to include OTC soy as well as HRT reviewed. I discussed the issues of HRT to include the WHI study with increased risk of stroke heart attack DVT as well as increased risk of breast cancer. She's not interested in doing anything at this point but monitoring.  If prolonged or atypical bleeding or gross more than one year without bleeding and then has bleeding  she knows to report this. 2. Urinary urgency. Long history proceeding her bladder suspension of some urgency with small bladder capacity. Seems to get a little worse now. No frequency dysuria or other UTI symptoms. Check baseline urinalysis now. Options for management include dietary changes such as decreasing caffeine and carbonated beverage up to and including OAB medications reviewed. She's not interested in doing this and wants to monitor present. 3. Mammography December 2013. Continue with annual mammogram. SBE monthly reviewed. 4. Pap smear 2011. No Pap smear done today. No history of abnormal Pap smears. Plan repeat next year at 3 year interval. 5. Health maintenance. Baseline CBC comprehensive metabolic panel lipid profile vitamin D and urinalysis ordered along with her FSH. Follow up one year, sooner as needed.    Dara Lords MD, 4:22 PM 08/12/2012

## 2012-08-12 NOTE — Patient Instructions (Signed)
Follow up for lab results. Otherwise follow up in one year for annual gynecologic exam.

## 2012-08-13 ENCOUNTER — Other Ambulatory Visit: Payer: Self-pay | Admitting: Gynecology

## 2012-08-13 DIAGNOSIS — E7800 Pure hypercholesterolemia, unspecified: Secondary | ICD-10-CM

## 2012-08-13 DIAGNOSIS — E559 Vitamin D deficiency, unspecified: Secondary | ICD-10-CM

## 2012-08-13 LAB — LIPID PANEL
Cholesterol: 263 mg/dL — ABNORMAL HIGH (ref 0–200)
HDL: 77 mg/dL (ref >39)
LDL Cholesterol: 155 mg/dL — ABNORMAL HIGH (ref 0–99)
Triglycerides: 156 mg/dL — ABNORMAL HIGH (ref <150)
VLDL: 31 mg/dL (ref 0–40)

## 2012-08-13 LAB — COMPREHENSIVE METABOLIC PANEL
Alkaline Phosphatase: 64 U/L (ref 39–117)
BUN: 11 mg/dL (ref 6–23)
CO2: 27 mEq/L (ref 19–32)
Creat: 0.65 mg/dL (ref 0.50–1.10)
Glucose, Bld: 100 mg/dL — ABNORMAL HIGH (ref 70–99)
Total Bilirubin: 0.6 mg/dL (ref 0.3–1.2)
Total Protein: 7.4 g/dL (ref 6.0–8.3)

## 2012-08-13 LAB — URINALYSIS W MICROSCOPIC + REFLEX CULTURE
Bilirubin Urine: NEGATIVE
Casts: NONE SEEN
Crystals: NONE SEEN
Hgb urine dipstick: NEGATIVE
Ketones, ur: NEGATIVE mg/dL
Nitrite: NEGATIVE
Specific Gravity, Urine: 1.011 (ref 1.005–1.030)
Urobilinogen, UA: 0.2 mg/dL (ref 0.0–1.0)

## 2012-08-13 LAB — FOLLICLE STIMULATING HORMONE: FSH: 94.8 m[IU]/mL

## 2012-08-14 ENCOUNTER — Telehealth: Payer: Self-pay | Admitting: Gynecology

## 2012-08-14 MED ORDER — CIPROFLOXACIN HCL 250 MG PO TABS: 250.0000 mg | ORAL_TABLET | Freq: Two times a day (BID) | ORAL | Status: DC

## 2012-08-14 NOTE — Telephone Encounter (Signed)
Tell patient urine looked like infection although did not culture out any bacteria.  Given her urinary symptoms, I want to cover her with ciprofloxin 250 mg bid X 7

## 2012-08-15 ENCOUNTER — Other Ambulatory Visit: Payer: Self-pay | Admitting: Gynecology

## 2012-08-15 MED ORDER — CIPROFLOXACIN HCL 250 MG PO TABS: 250.0000 mg | ORAL_TABLET | Freq: Two times a day (BID) | ORAL | Status: DC

## 2012-08-15 NOTE — Telephone Encounter (Signed)
Patient informed. Patient is on her way to [place] and asked Rx be called in to [facility] there.  I called her local [facility] and cancelled the RX and e-scribed it to her pharmacy in [place].

## 2012-08-26 ENCOUNTER — Telehealth: Payer: Self-pay | Admitting: *Deleted

## 2012-08-26 DIAGNOSIS — T782XXA Anaphylactic shock, unspecified, initial encounter: Secondary | ICD-10-CM

## 2012-08-26 DIAGNOSIS — L509 Urticaria, unspecified: Secondary | ICD-10-CM

## 2012-08-26 NOTE — Telephone Encounter (Signed)
Pt will be coming to have repeat labs drawn on 08/29/12 lipid profile & Vit. D. Dr.Eric Kozlow  nurse amy called he would like patient to have the follow labs drawn with the above labs if okay with you so patient will not have to get blood drawn twice: Ana Reflex, ESR (SED RATE), H Pylori IgG & IgA screen, H Pylori Igm, T4 serum, Thyroid peroxidase antibody, Tryptase serum, TSH. [id number], urticaria. Please advise

## 2012-08-26 NOTE — Telephone Encounter (Signed)
Okay for additional labs to be drawn. Instructed patient she can access on my chart to provide to the other doctor.

## 2012-08-27 NOTE — Telephone Encounter (Signed)
Order placed, called Nurse amy back to inform her this was okay per TF.

## 2012-08-28 ENCOUNTER — Other Ambulatory Visit: Payer: BC Managed Care – PPO

## 2012-08-29 ENCOUNTER — Other Ambulatory Visit: Payer: BC Managed Care – PPO

## 2012-08-29 ENCOUNTER — Other Ambulatory Visit: Payer: Self-pay | Admitting: Gynecology

## 2012-08-29 DIAGNOSIS — E7800 Pure hypercholesterolemia, unspecified: Secondary | ICD-10-CM

## 2012-08-29 DIAGNOSIS — L509 Urticaria, unspecified: Secondary | ICD-10-CM

## 2012-08-29 DIAGNOSIS — T782XXA Anaphylactic shock, unspecified, initial encounter: Secondary | ICD-10-CM

## 2012-08-30 LAB — LIPID PANEL
Cholesterol: 215 mg/dL — ABNORMAL HIGH (ref 0–200)
VLDL: 22 mg/dL (ref 0–40)

## 2012-08-30 LAB — THYROID PEROXIDASE ANTIBODY: Thyroperoxidase Ab SerPl-aCnc: 39.3 IU/mL — ABNORMAL HIGH (ref <35.0)

## 2012-08-30 LAB — T4: T4, Total: 7.9 ug/dL (ref 5.0–12.5)

## 2012-09-05 ENCOUNTER — Telehealth: Payer: Self-pay

## 2012-09-05 ENCOUNTER — Encounter: Payer: Self-pay | Admitting: Gynecology

## 2012-09-05 NOTE — Telephone Encounter (Signed)
Patient informed.  We are sending her info on activating her My Chart but results were reviewed with her today.

## 2012-09-05 NOTE — Telephone Encounter (Signed)
Tell patient to log on to my chart so she can see the results. They're much better. She has a borderline cholesterol which is mitigated by her great HDL. Her LDL is 105 which technically is slightly elevated but given her picture I think certainly acceptable and I would just repeat a fasting lipid profile one year.

## 2012-09-05 NOTE — Telephone Encounter (Signed)
You had asked her to return for fasting lipid profile and she did that on 08/29/12.  She is asking regarding results as she has not heard from [place] yet regarding this. What to advise?

## 2012-10-04 ENCOUNTER — Emergency Department: Payer: Self-pay | Admitting: Emergency Medicine

## 2013-03-04 ENCOUNTER — Ambulatory Visit: Payer: Self-pay | Admitting: Emergency Medicine

## 2013-03-04 LAB — CBC WITH DIFFERENTIAL/PLATELET
Basophil #: 0.1 10*3/uL (ref 0.0–0.1)
Basophil %: 1.5 %
Eosinophil #: 0.2 10*3/uL (ref 0.0–0.7)
Eosinophil %: 2 %
HCT: 43.1 % (ref 35.0–47.0)
HGB: 14.6 g/dL (ref 12.0–16.0)
Lymphocyte #: 1.2 10*3/uL (ref 1.0–3.6)
Lymphocyte %: 13.3 %
MCH: 30.9 pg (ref 26.0–34.0)
MCHC: 33.9 g/dL (ref 32.0–36.0)
MCV: 91 fL (ref 80–100)
Monocyte #: 1.1 x10 3/mm — ABNORMAL HIGH (ref 0.2–0.9)
Monocyte %: 12.3 %
Neutrophil #: 6.3 10*3/uL (ref 1.4–6.5)
Neutrophil %: 70.9 %
Platelet: 266 10*3/uL (ref 150–440)
RBC: 4.72 10*6/uL (ref 3.80–5.20)
RDW: 13 % (ref 11.5–14.5)
WBC: 8.9 10*3/uL (ref 3.6–11.0)

## 2013-03-04 LAB — COMPREHENSIVE METABOLIC PANEL
Albumin: 3.9 g/dL (ref 3.4–5.0)
Alkaline Phosphatase: 86 U/L (ref 50–136)
Anion Gap: 11 (ref 7–16)
BUN: 8 mg/dL (ref 7–18)
Bilirubin,Total: 0.3 mg/dL (ref 0.2–1.0)
Calcium, Total: 9.3 mg/dL (ref 8.5–10.1)
Chloride: 98 mmol/L (ref 98–107)
Co2: 29 mmol/L (ref 21–32)
Creatinine: 0.77 mg/dL (ref 0.60–1.30)
EGFR (African American): >60
EGFR (Non-African Amer.): >60
Glucose: 94 mg/dL (ref 65–99)
Osmolality: 274 (ref 275–301)
Potassium: 4 mmol/L (ref 3.5–5.1)
SGOT(AST): 16 U/L (ref 15–37)
SGPT (ALT): 24 U/L (ref 12–78)
Sodium: 138 mmol/L (ref 136–145)
Total Protein: 8 g/dL (ref 6.4–8.2)

## 2013-03-04 LAB — MONONUCLEOSIS SCREEN: Mono Test: NEGATIVE

## 2013-03-04 LAB — SEDIMENTATION RATE: Erythrocyte Sed Rate: 21 mm/hr — ABNORMAL HIGH (ref 0–20)

## 2013-06-19 ENCOUNTER — Other Ambulatory Visit: Payer: Self-pay

## 2013-06-25 ENCOUNTER — Other Ambulatory Visit: Payer: Self-pay

## 2013-06-25 DIAGNOSIS — Z1231 Encounter for screening mammogram for malignant neoplasm of breast: Secondary | ICD-10-CM

## 2013-07-28 ENCOUNTER — Ambulatory Visit
Admission: RE | Admit: 2013-07-28 | Discharge: 2013-07-28 | Disposition: A | Discharge status: Home / Self Care | Payer: Managed Care, Other (non HMO) | Source: Ambulatory Visit

## 2013-07-28 DIAGNOSIS — Z1231 Encounter for screening mammogram for malignant neoplasm of breast: Secondary | ICD-10-CM

## 2013-08-13 ENCOUNTER — Encounter: Payer: Self-pay | Admitting: Gynecology

## 2013-08-14 HISTORY — PX: COLONOSCOPY: SHX174

## 2013-08-19 ENCOUNTER — Ambulatory Visit (INDEPENDENT_AMBULATORY_CARE_PROVIDER_SITE_OTHER): Payer: Managed Care, Other (non HMO) | Admitting: Gynecology

## 2013-08-19 ENCOUNTER — Encounter: Payer: Self-pay | Admitting: Gynecology

## 2013-08-19 ENCOUNTER — Other Ambulatory Visit (HOSPITAL_COMMUNITY)
Admission: RE | Admit: 2013-08-19 | Discharge: 2013-08-19 | Disposition: A | Discharge status: Home / Self Care | Payer: Managed Care, Other (non HMO) | Source: Ambulatory Visit | Attending: Gynecology | Admitting: Gynecology

## 2013-08-19 VITALS — BP 116/74 | Ht 64.0 in | Wt 150.0 lb

## 2013-08-19 DIAGNOSIS — Z01419 Encounter for gynecological examination (general) (routine) without abnormal findings: Secondary | ICD-10-CM

## 2013-08-19 DIAGNOSIS — N9489 Other specified conditions associated with female genital organs and menstrual cycle: Secondary | ICD-10-CM

## 2013-08-19 DIAGNOSIS — N952 Postmenopausal atrophic vaginitis: Secondary | ICD-10-CM

## 2013-08-19 DIAGNOSIS — N898 Other specified noninflammatory disorders of vagina: Secondary | ICD-10-CM

## 2013-08-19 DIAGNOSIS — Z1151 Encounter for screening for human papillomavirus (HPV): Secondary | ICD-10-CM | POA: Insufficient documentation

## 2013-08-19 LAB — CBC WITH DIFFERENTIAL/PLATELET
BASOS ABS: 0.1 10*3/uL (ref 0.0–0.1)
Basophils Relative: 1 % (ref 0–1)
EOS ABS: 0.1 10*3/uL (ref 0.0–0.7)
Eosinophils Relative: 2 % (ref 0–5)
HCT: 38.3 % (ref 36.0–46.0)
Hemoglobin: 13.2 g/dL (ref 12.0–15.0)
Lymphocytes Relative: 35 % (ref 12–46)
Lymphs Abs: 2.4 10*3/uL (ref 0.7–4.0)
MCH: 30.5 pg (ref 26.0–34.0)
MCHC: 34.5 g/dL (ref 30.0–36.0)
MCV: 88.5 fL (ref 78.0–100.0)
MONO ABS: 0.8 10*3/uL (ref 0.1–1.0)
Monocytes Relative: 11 % (ref 3–12)
NEUTROS ABS: 3.5 10*3/uL (ref 1.7–7.7)
NEUTROS PCT: 51 % (ref 43–77)
Platelets: 448 10*3/uL — ABNORMAL HIGH (ref 150–400)
RBC: 4.33 MIL/uL (ref 3.87–5.11)
RDW: 14.3 % (ref 11.5–15.5)
WBC: 6.8 10*3/uL (ref 4.0–10.5)

## 2013-08-19 NOTE — Patient Instructions (Signed)
Schedule colonoscopy with [place] [facility] at [phone] or [facility] gastroenterology at [phone]  Start vagifem nightly for 12 days then 2 times weekly  Follow up in one year for annual exam

## 2013-08-19 NOTE — Progress Notes (Signed)
Hannah [name] 08-09-1961 [id number]        53 y.o.  [id number] for annual exam.  Several issues noted below.  Past medical history,surgical history, problem list, medications, allergies, family history and social history were all reviewed and documented in the EPIC chart.  ROS:  Performed and pertinent positives and negatives are included in the history, assessment and plan .  Exam: Kim assistant Filed Vitals:   08/19/13 1612  BP: 116/74  Height: 5\' 4"  (1.626 m)  Weight: 150 lb (68.04 kg)   General appearance  Normal Skin grossly normal Head/Neck normal with no cervical or supraclavicular adenopathy thyroid normal Lungs  clear Cardiac RR, without RMG Abdominal  soft, nontender, without masses, organomegaly or hernia Breasts  examined lying and sitting without masses, retractions, discharge or axillary adenopathy. Pelvic  Ext/BUS/vagina  Normal with atrophic changes.  Cervix  Normal with atrophic changes. Pap/HPV done  Uterus  anteverted, normal size, shape and contour, midline and mobile nontender   Adnexa  Without masses or tenderness    Anus and perineum  Normal   Rectovaginal  Normal sphincter tone without palpated masses or tenderness.    Assessment/Plan:  53 y.o. [id number] female for annual exam.   1. Postmenopausal/atrophic genital changes/vaginal dryness/dyspareunia. Not having any bleeding. Is having some night sweats. Is having significant dyspareunia and vaginal dryness. Has tried over-the-counter products without benefit.  Does have vaginal atrophy on exam. Options for management reviewed to include OTC products, vaginal estrogen such as Vagifem, vaginal estrogen cream and Osphena. Systemic HRT which will address her hot flashes also discussed.  I reviewed the whole issue of HRT with her to include the WHI study with increased risk of stroke, heart attack, DVT and breast cancer. The ACOG and NAMS statements for lowest dose for the shortest period of time reviewed.  Transdermal versus oral first-pass effect benefit discussed.  Patient feels that her hot flashes are not really a significant issue but would prefer to address the vaginal issue. Will start with trial of Vagifem nightly x12 then twice weekly with 18 samples provided. Issues of absorption discussed with above risks of HRT as well as endometrial stimulation reviewed. Patient will call me at the end of the Vagifem samples and then we'll go from there.Call if any vaginal bleeding. 2. Mammography 07/2013. Continue with annual mammography. SBE monthly reviewed. 3. Pap smear 2011. Pap/HPV today. No history of significant abnormal Pap smears previously. 4. DEXA never. We'll plan further into the menopause. Increase calcium vitamin D reviewed. Check vitamin D level today. 5. Colonoscopy never. Patient plans to schedule this year and names and numbers provided. 6. Health maintenance. Baseline CBC comprehensive metabolic panel lipid profile urinalysis vitamin D TSH ordered. Followup with response to Vagifem otherwise 1 year.  Note: This document was prepared with digital dictation and possible smart phrase technology. Any transcriptional errors that result from this process are unintentional.   [name] MD, 5:15 PM 08/19/2013

## 2013-08-20 LAB — URINALYSIS W MICROSCOPIC + REFLEX CULTURE
BACTERIA UA: NONE SEEN
Bilirubin Urine: NEGATIVE
CASTS: NONE SEEN
Crystals: NONE SEEN
Glucose, UA: NEGATIVE mg/dL
HGB URINE DIPSTICK: NEGATIVE
KETONES UR: NEGATIVE mg/dL
Nitrite: NEGATIVE
PROTEIN: NEGATIVE mg/dL
Specific Gravity, Urine: 1.005 (ref 1.005–1.030)
Squamous Epithelial / LPF: NONE SEEN
UROBILINOGEN UA: 0.2 mg/dL (ref 0.0–1.0)
pH: 6 (ref 5.0–8.0)

## 2013-08-20 LAB — LIPID PANEL
CHOL/HDL RATIO: 2.5 ratio
Cholesterol: 218 mg/dL — ABNORMAL HIGH (ref 0–200)
HDL: 87 mg/dL (ref >39)
LDL CALC: 109 mg/dL — AB (ref 0–99)
TRIGLYCERIDES: 112 mg/dL (ref <150)
VLDL: 22 mg/dL (ref 0–40)

## 2013-08-20 LAB — COMPREHENSIVE METABOLIC PANEL
ALBUMIN: 4.7 g/dL (ref 3.5–5.2)
ALK PHOS: 62 U/L (ref 39–117)
ALT: 14 U/L (ref 0–35)
AST: 19 U/L (ref 0–37)
BUN: 14 mg/dL (ref 6–23)
CO2: 26 mEq/L (ref 19–32)
Calcium: 9.7 mg/dL (ref 8.4–10.5)
Chloride: 102 mEq/L (ref 96–112)
Creat: 0.7 mg/dL (ref 0.50–1.10)
Glucose, Bld: 81 mg/dL (ref 70–99)
POTASSIUM: 3.9 meq/L (ref 3.5–5.3)
Sodium: 137 mEq/L (ref 135–145)
Total Bilirubin: 0.4 mg/dL (ref 0.3–1.2)
Total Protein: 7.3 g/dL (ref 6.0–8.3)

## 2013-08-20 LAB — TSH: TSH: 1.707 u[IU]/mL (ref 0.350–4.500)

## 2013-08-20 LAB — VITAMIN D 25 HYDROXY (VIT D DEFICIENCY, FRACTURES): VIT D 25 HYDROXY: 74 ng/mL (ref 30–89)

## 2013-08-21 LAB — URINE CULTURE
COLONY COUNT: NO GROWTH
Organism ID, Bacteria: NO GROWTH

## 2014-05-29 ENCOUNTER — Other Ambulatory Visit: Payer: Self-pay

## 2014-06-15 ENCOUNTER — Encounter: Payer: Self-pay | Admitting: Gynecology

## 2014-07-02 ENCOUNTER — Other Ambulatory Visit: Payer: Self-pay

## 2014-07-02 DIAGNOSIS — Z1231 Encounter for screening mammogram for malignant neoplasm of breast: Secondary | ICD-10-CM

## 2014-07-29 ENCOUNTER — Ambulatory Visit
Admission: RE | Admit: 2014-07-29 | Discharge: 2014-07-29 | Disposition: A | Discharge status: Home / Self Care | Payer: BC Managed Care – PPO | Source: Ambulatory Visit

## 2014-07-29 DIAGNOSIS — Z1231 Encounter for screening mammogram for malignant neoplasm of breast: Secondary | ICD-10-CM

## 2014-09-04 ENCOUNTER — Encounter: Payer: Managed Care, Other (non HMO) | Admitting: Gynecology

## 2014-10-09 ENCOUNTER — Ambulatory Visit (INDEPENDENT_AMBULATORY_CARE_PROVIDER_SITE_OTHER): Payer: BLUE CROSS/BLUE SHIELD | Admitting: Gynecology

## 2014-10-09 ENCOUNTER — Encounter: Payer: Self-pay | Admitting: Gynecology

## 2014-10-09 VITALS — BP 120/76 | Ht 64.0 in | Wt 151.0 lb

## 2014-10-09 DIAGNOSIS — N393 Stress incontinence (female) (male): Secondary | ICD-10-CM

## 2014-10-09 DIAGNOSIS — N952 Postmenopausal atrophic vaginitis: Secondary | ICD-10-CM

## 2014-10-09 DIAGNOSIS — Z01419 Encounter for gynecological examination (general) (routine) without abnormal findings: Secondary | ICD-10-CM

## 2014-10-09 LAB — COMPREHENSIVE METABOLIC PANEL
ALK PHOS: 70 U/L (ref 39–117)
ALT: 12 U/L (ref 0–35)
AST: 18 U/L (ref 0–37)
Albumin: 4.6 g/dL (ref 3.5–5.2)
BILIRUBIN TOTAL: 0.5 mg/dL (ref 0.2–1.2)
BUN: 18 mg/dL (ref 6–23)
CALCIUM: 9.5 mg/dL (ref 8.4–10.5)
CO2: 26 mEq/L (ref 19–32)
Chloride: 101 mEq/L (ref 96–112)
Creat: 0.82 mg/dL (ref 0.50–1.10)
Glucose, Bld: 74 mg/dL (ref 70–99)
Potassium: 4.3 mEq/L (ref 3.5–5.3)
Sodium: 137 mEq/L (ref 135–145)
Total Protein: 7.5 g/dL (ref 6.0–8.3)

## 2014-10-09 LAB — LIPID PANEL
CHOLESTEROL: 212 mg/dL — AB (ref 0–200)
HDL: 98 mg/dL (ref >=46)
LDL CALC: 100 mg/dL — AB (ref 0–99)
Total CHOL/HDL Ratio: 2.2 Ratio
Triglycerides: 69 mg/dL (ref <150)
VLDL: 14 mg/dL (ref 0–40)

## 2014-10-09 LAB — TSH: TSH: 1.626 u[IU]/mL (ref 0.350–4.500)

## 2014-10-09 MED ORDER — ESTROGENS, CONJUGATED 0.625 MG/GM VA CREA: TOPICAL_CREAM | VAGINAL | Status: DC

## 2014-10-09 NOTE — Progress Notes (Signed)
Hannah [name] [birth year] [id number]        54 y.o.  [id number] for annual exam.  Several issues noted below.  Past medical history,surgical history, problem list, medications, allergies, family history and social history were all reviewed and documented as reviewed in the EPIC chart.  ROS:  Performed with pertinent positives and negatives included in the history, assessment and plan.   Additional significant findings :  none   Exam: Kim Counsellor Vitals:   10/09/14 1410  BP: 120/76  Height: 5\' 4"  (1.626 m)  Weight: 151 lb (68.493 kg)   General appearance:  Normal affect, orientation and appearance. Skin: Grossly normal HEENT: Without gross lesions.  No cervical or supraclavicular adenopathy. Thyroid normal.  Lungs:  Clear without wheezing, rales or rhonchi Cardiac: RR, without RMG Abdominal:  Soft, nontender, without masses, guarding, rebound, organomegaly or hernia Breasts:  Examined lying and sitting without masses, retractions, discharge or axillary adenopathy. Pelvic:  Ext/BUS/vagina with atrophic changes  Cervix atrophic  Uterus axial to retroverted, normal size, shape and contour, midline and mobile nontender   Adnexa  Without masses or tenderness    Anus and perineum  Normal   Rectovaginal  Normal sphincter tone without palpated masses or tenderness.    Assessment/Plan:  54 y.o. [id number] female for annual exam.   1. Postmenopausal/atrophic genital changes. Having issues with vaginal dryness and dyspareunia. Tried Vagifem without success last year. Options to include OTC products, vaginal estrogen cream, increasing frequency of Vagifem, Osphena, systemic HRT all reviewed. Patient's mother has tried Premarin a day well with this and she is interested trying. I reviewed the risks of absorption with systemic side effects to include stroke heart attack DVT breast cancer and endometrial stimulation. Premarin vaginal cream one tube, one quarter applicator twice weekly refill  4 prescribed.  Follow up it issues. 2. Urinary incontinence. Patient with worsening urinary incontinence to include stress with laughing coughing sneezing and exercising. Also having several episodes of spontaneous leakage when she doesn't even feel it. History of sling by Dr. Gaynelle Arabian. Recommend follow up with urology and patient will go ahead make these arrangements. Check urinalysis today. 3. Pap smear/HPV negative 08/2013. No Pap smear done today. No history of significant abnormal Pap smears. Repeat Pap smear in 3-5 year interval. 4. Mammography 07/2014. Continue with annual mammography. SBE monthly reviewed. 5. Colonoscopy 2015. Repeat at their recommended interval. 6. DEXA never. Will schedule further into the menopause. Check vitamin D level today. Exercises on a regular basis 4 times weekly. 7. Health maintenance. Baseline CBC, comprehensive metabolic panel, lipid profile, urinalysis, TSH vitamin D ordered. Follow up in one year, sooner as needed.     [name] MD, 2:57 PM 10/09/2014

## 2014-10-09 NOTE — Patient Instructions (Signed)
Office will call you to arrange the appointment with Dr. Gaynelle Arabian at [facility].  Start the Premarin vaginal cream. Call me if you have any issues.  You may obtain a copy of any labs that were done today by logging onto MyChart as outlined in the instructions provided with your AVS (after visit summary). The office will not call with normal lab results but certainly if there are any significant abnormalities then we will contact you.   Health Maintenance, Female A healthy lifestyle and preventative care can promote health and wellness.  Maintain regular health, dental, and eye exams.  Eat a healthy diet. Foods like vegetables, fruits, whole grains, low-fat dairy products, and lean protein foods contain the nutrients you need without too many calories. Decrease your intake of foods high in solid fats, added sugars, and salt. Get information about a proper diet from your caregiver, if necessary.  Regular physical exercise is one of the most important things you can do for your health. Most adults should get at least 150 minutes of moderate-intensity exercise (any activity that increases your heart rate and causes you to sweat) each week. In addition, most adults need muscle-strengthening exercises on 2 or more days a week.   Maintain a healthy weight. The body mass index (BMI) is a screening tool to identify possible weight problems. It provides an estimate of body fat based on height and weight. Your caregiver can help determine your BMI, and can help you achieve or maintain a healthy weight. For adults 20 years and older:  A BMI below 18.5 is considered underweight.  A BMI of 18.5 to 24.9 is normal.  A BMI of 25 to 29.9 is considered overweight.  A BMI of 30 and above is considered obese.  Maintain normal blood lipids and cholesterol by exercising and minimizing your intake of saturated fat. Eat a balanced diet with plenty of fruits and vegetables. Blood tests for lipids and  cholesterol should begin at age 53 and be repeated every 5 years. If your lipid or cholesterol levels are high, you are over 50, or you are a high risk for heart disease, you may need your cholesterol levels checked more frequently.Ongoing high lipid and cholesterol levels should be treated with medicines if diet and exercise are not effective.  If you smoke, find out from your caregiver how to quit. If you do not use tobacco, do not start.  Lung cancer screening is recommended for adults aged 7 80 years who are at high risk for developing lung cancer because of a history of smoking. Yearly low-dose computed tomography (CT) is recommended for people who have at least a 30-pack-year history of smoking and are a current smoker or have quit within the past 15 years. A pack year of smoking is smoking an average of 1 pack of cigarettes a day for 1 year (for example: 1 pack a day for 30 years or 2 packs a day for 15 years). Yearly screening should continue until the smoker has stopped smoking for at least 15 years. Yearly screening should also be stopped for people who develop a health problem that would prevent them from having lung cancer treatment.  If you are pregnant, do not drink alcohol. If you are breastfeeding, be very cautious about drinking alcohol. If you are not pregnant and choose to drink alcohol, do not exceed 1 drink per day. One drink is considered to be 12 ounces (355 mL) of beer, 5 ounces (148 mL) of wine, or 1.5  ounces (44 mL) of liquor.  Avoid use of street drugs. Do not share needles with anyone. Ask for help if you need support or instructions about stopping the use of drugs.  High blood pressure causes heart disease and increases the risk of stroke. Blood pressure should be checked at least every 1 to 2 years. Ongoing high blood pressure should be treated with medicines, if weight loss and exercise are not effective.  If you are 55 to 52 years old, ask your caregiver if you should  take aspirin to prevent strokes.  Diabetes screening involves taking a blood sample to check your fasting blood sugar level. This should be done once every 3 years, after age 45, if you are within normal weight and without risk factors for diabetes. Testing should be considered at a younger age or be carried out more frequently if you are overweight and have at least 1 risk factor for diabetes.  Breast cancer screening is essential preventative care for women. You should practice "breast self-awareness." This means understanding the normal appearance and feel of your breasts and may include breast self-examination. Any changes detected, no matter how small, should be reported to a caregiver. Women in their 20s and 30s should have a clinical breast exam (CBE) by a caregiver as part of a regular health exam every 1 to 3 years. After age 40, women should have a CBE every year. Starting at age 40, women should consider having a mammogram (breast X-ray) every year. Women who have a family history of breast cancer should talk to their caregiver about genetic screening. Women at a high risk of breast cancer should talk to their caregiver about having an MRI and a mammogram every year.  Breast cancer gene (BRCA)-related cancer risk assessment is recommended for women who have family members with BRCA-related cancers. BRCA-related cancers include breast, ovarian, tubal, and peritoneal cancers. Having family members with these cancers may be associated with an increased risk for harmful changes (mutations) in the breast cancer genes BRCA1 and BRCA2. Results of the assessment will determine the need for genetic counseling and BRCA1 and BRCA2 testing.  The Pap test is a screening test for cervical cancer. Women should have a Pap test starting at age 21. Between ages 21 and 29, Pap tests should be repeated every 2 years. Beginning at age 30, you should have a Pap test every 3 years as long as the past 3 Pap tests have  been normal. If you had a hysterectomy for a problem that was not cancer or a condition that could lead to cancer, then you no longer need Pap tests. If you are between ages 65 and 70, and you have had normal Pap tests going back 10 years, you no longer need Pap tests. If you have had past treatment for cervical cancer or a condition that could lead to cancer, you need Pap tests and screening for cancer for at least 20 years after your treatment. If Pap tests have been discontinued, risk factors (such as a new sexual partner) need to be reassessed to determine if screening should be resumed. Some women have medical problems that increase the chance of getting cervical cancer. In these cases, your caregiver may recommend more frequent screening and Pap tests.  The human papillomavirus (HPV) test is an additional test that may be used for cervical cancer screening. The HPV test looks for the virus that can cause the cell changes on the cervix. The cells collected during the Pap test   can be tested for HPV. The HPV test could be used to screen women aged 58 years and older, and should be used in women of any age who have unclear Pap test results. After the age of 45, women should have HPV testing at the same frequency as a Pap test.  Colorectal cancer can be detected and often prevented. Most routine colorectal cancer screening begins at the age of 49 and continues through age 34. However, your caregiver may recommend screening at an earlier age if you have risk factors for colon cancer. On a yearly basis, your caregiver may provide home test kits to check for hidden blood in the stool. Use of a small camera at the end of a tube, to directly examine the colon (sigmoidoscopy or colonoscopy), can detect the earliest forms of colorectal cancer. Talk to your caregiver about this at age 24, when routine screening begins. Direct examination of the colon should be repeated every 5 to 10 years through age 74, unless early  forms of pre-cancerous polyps or small growths are found.  Hepatitis C blood testing is recommended for all people born from [birth year] through 1965 and any individual with known risks for hepatitis C.  Practice safe sex. Use condoms and avoid high-risk sexual practices to reduce the spread of sexually transmitted infections (STIs). Sexually active women aged 39 and younger should be checked for Chlamydia, which is a common sexually transmitted infection. Older women with new or multiple partners should also be tested for Chlamydia. Testing for other STIs is recommended if you are sexually active and at increased risk.  Osteoporosis is a disease in which the bones lose minerals and strength with aging. This can result in serious bone fractures. The risk of osteoporosis can be identified using a bone density scan. Women ages 48 and over and women at risk for fractures or osteoporosis should discuss screening with their caregivers. Ask your caregiver whether you should be taking a calcium supplement or vitamin D to reduce the rate of osteoporosis.  Menopause can be associated with physical symptoms and risks. Hormone replacement therapy is available to decrease symptoms and risks. You should talk to your caregiver about whether hormone replacement therapy is right for you.  Use sunscreen. Apply sunscreen liberally and repeatedly throughout the day. You should seek shade when your shadow is shorter than you. Protect yourself by wearing long sleeves, pants, a wide-brimmed hat, and sunglasses year round, whenever you are outdoors.  Notify your caregiver of new moles or changes in moles, especially if there is a change in shape or color. Also notify your caregiver if a mole is larger than the size of a pencil eraser.  Stay current with your immunizations. Document Released: 02/13/2011 Document Revised: 11/25/2012 Document Reviewed: 02/13/2011 [facility] Patient Information 2014 [place].

## 2014-10-10 LAB — CBC WITH DIFFERENTIAL/PLATELET
Basophils Absolute: 0.1 K/uL (ref 0.0–0.1)
Basophils Relative: 1 % (ref 0–1)
Eosinophils Absolute: 0.2 K/uL (ref 0.0–0.7)
Eosinophils Relative: 2 % (ref 0–5)
HCT: 40.9 % (ref 36.0–46.0)
Hemoglobin: 13.8 g/dL (ref 12.0–15.0)
Lymphocytes Relative: 38 % (ref 12–46)
Lymphs Abs: 2.9 K/uL (ref 0.7–4.0)
MCH: 30.3 pg (ref 26.0–34.0)
MCHC: 33.7 g/dL (ref 30.0–36.0)
MCV: 89.9 fL (ref 78.0–100.0)
MPV: 10.3 fL (ref 8.6–12.4)
Monocytes Absolute: 0.8 K/uL (ref 0.1–1.0)
Monocytes Relative: 10 % (ref 3–12)
Neutro Abs: 3.7 K/uL (ref 1.7–7.7)
Neutrophils Relative %: 49 % (ref 43–77)
Platelets: 453 K/uL — ABNORMAL HIGH (ref 150–400)
RBC: 4.55 MIL/uL (ref 3.87–5.11)
RDW: 13.1 % (ref 11.5–15.5)
WBC: 7.6 K/uL (ref 4.0–10.5)

## 2014-10-10 LAB — URINALYSIS W MICROSCOPIC + REFLEX CULTURE
Bilirubin Urine: NEGATIVE
Casts: NONE SEEN
Crystals: NONE SEEN
Glucose, UA: NEGATIVE mg/dL
Hgb urine dipstick: NEGATIVE
Ketones, ur: NEGATIVE mg/dL
Nitrite: NEGATIVE
Protein, ur: NEGATIVE mg/dL
Specific Gravity, Urine: 1.024 (ref 1.005–1.030)
Urobilinogen, UA: 0.2 mg/dL (ref 0.0–1.0)
pH: 6.5 (ref 5.0–8.0)

## 2014-10-10 LAB — VITAMIN D 25 HYDROXY (VIT D DEFICIENCY, FRACTURES): Vit D, 25-Hydroxy: 27 ng/mL — ABNORMAL LOW (ref 30–100)

## 2014-10-11 LAB — URINE CULTURE
COLONY COUNT: NO GROWTH
Organism ID, Bacteria: NO GROWTH

## 2014-10-12 ENCOUNTER — Telehealth: Payer: Self-pay | Admitting: *Deleted

## 2014-10-12 NOTE — Telephone Encounter (Signed)
Referral faxed to [facility] they will contact will time and date of appointment to relay to patient.

## 2014-10-12 NOTE — Telephone Encounter (Signed)
-----   Message from [name], MD sent at 10/09/2014  2:50 PM EST ----- Schedule an appointment with Dr. Gaynelle Arabian at [facility].  Reference recurrent urinary incontinence status post sling that he did.

## 2014-10-13 ENCOUNTER — Ambulatory Visit: Payer: Self-pay | Admitting: Physician Assistant

## 2014-10-15 ENCOUNTER — Ambulatory Visit: Payer: Self-pay | Admitting: Family Medicine

## 2014-10-16 NOTE — Telephone Encounter (Signed)
Appointment on 11/24/14 @ 2:00pm with Dr.Tannebaum, pt aware

## 2015-05-11 ENCOUNTER — Encounter: Payer: Self-pay | Admitting: Gynecology

## 2015-05-11 ENCOUNTER — Ambulatory Visit (INDEPENDENT_AMBULATORY_CARE_PROVIDER_SITE_OTHER): Payer: BLUE CROSS/BLUE SHIELD | Admitting: Gynecology

## 2015-05-11 VITALS — BP 120/76

## 2015-05-11 DIAGNOSIS — N950 Postmenopausal bleeding: Secondary | ICD-10-CM

## 2015-05-11 DIAGNOSIS — R102 Pelvic and perineal pain: Secondary | ICD-10-CM

## 2015-05-11 NOTE — Patient Instructions (Signed)
Follow up for ultrasound as scheduled 

## 2015-05-11 NOTE — Progress Notes (Signed)
Hannah [name] 1961/02/04 [id number]        54 y.o.  [id number] Presents with history of spotting on and off for the last 2 months. Last menstrual period several years prior to this. Also noting onset and worsening of hot flashes and night sweats. Being followed by Dr. Gaynelle Arabian with ongoing incontinence status post trans-obturator pubovaginal sling 2003. Using physical therapy now through [facility]. Notes pain with intercourse primarily tearing/tightness and dryness type pain. Having some coming and going left sided pain although it feels more superficial in her skin that deepen the physical therapist is working with this also.  Past medical history,surgical history, problem list, medications, allergies, family history and social history were all reviewed and documented in the EPIC chart.  Directed ROS with pertinent positives and negatives documented in the history of present illness/assessment and plan.  Exam: [place] assistant Filed Vitals:   05/11/15 1202  BP: 120/76   General appearance:  Normal Abdomen soft nontender without masses guarding rebound Pelvic external BUS vagina with atrophic changes. Difficult to insert 2 fingers due to tightness and discomfort. Tenderness along the left anterior sling margin. Cervix grossly normal. Uterus grossly normal midline mobile nontender. Adnexa without masses or tenderness. Rectal exam is normal.  Assessment/Plan:  54 y.o. [id number] with postmenopausal bleeding 2 months. Also with atrophic vaginal changes and pain with intercourse attributable to this. Question the  Contributing component of the sling as she is tender when you touch the sling. Also having worsening menopausal symptoms such as hot flashes and sweats. Discussed options to include estrogen either vaginally or systemically. The risks and benefits of both approaches discussed. Risks  Also reviewed to include stroke heart attack DVT and breast cancer. I recommendation would be to start  HRT which would address the systemic symptoms and the vaginal atrophy. She'll continue to follow up with Dr. Gaynelle Arabian as far as her incontinence and sling issues. We'll start with sonohysterogram for uterine assessment secondary to the postmenopausal bleeding rule out polyps/hyperplasia. Does have a history of myomas status post myomectomy in the past.  Patient will follow up with sonohysterogram and then we'll further discuss her HRT options.    [name] MD, 12:41 PM 05/11/2015

## 2015-05-17 ENCOUNTER — Ambulatory Visit (INDEPENDENT_AMBULATORY_CARE_PROVIDER_SITE_OTHER): Payer: BLUE CROSS/BLUE SHIELD | Admitting: Pediatrics

## 2015-05-17 ENCOUNTER — Encounter: Payer: Self-pay | Admitting: Pediatrics

## 2015-05-17 VITALS — BP 122/78 | HR 84 | Temp 97.7°F | Resp 18 | Ht 63.78 in | Wt 157.6 lb

## 2015-05-17 DIAGNOSIS — K219 Gastro-esophageal reflux disease without esophagitis: Secondary | ICD-10-CM | POA: Insufficient documentation

## 2015-05-17 DIAGNOSIS — Z23 Encounter for immunization: Secondary | ICD-10-CM

## 2015-05-17 DIAGNOSIS — J4530 Mild persistent asthma, uncomplicated: Secondary | ICD-10-CM | POA: Insufficient documentation

## 2015-05-17 DIAGNOSIS — J3089 Other allergic rhinitis: Secondary | ICD-10-CM | POA: Insufficient documentation

## 2015-05-17 DIAGNOSIS — J4531 Mild persistent asthma with (acute) exacerbation: Secondary | ICD-10-CM

## 2015-05-17 DIAGNOSIS — L501 Idiopathic urticaria: Secondary | ICD-10-CM | POA: Insufficient documentation

## 2015-05-17 MED ORDER — BECLOMETHASONE DIPROPIONATE 80 MCG/ACT IN AERS: 1.0000 | INHALATION_SPRAY | Freq: Two times a day (BID) | RESPIRATORY_TRACT | Status: DC

## 2015-05-17 MED ORDER — ALBUTEROL SULFATE HFA 108 (90 BASE) MCG/ACT IN AERS: 2.0000 | INHALATION_SPRAY | RESPIRATORY_TRACT | Status: DC | PRN

## 2015-05-17 NOTE — Assessment & Plan Note (Signed)
Start Qvar 80 1 puff twice a day to prevent cough or wheeze. Rinse gargle and spit out after use. Ventolin 2 puffs every 4 hours if needed for wheezing or coughing spells. Fexofenadine 180 mg in the morning. Cetirizine 10 mg at night for a runny nose She should have a flu vaccination

## 2015-05-17 NOTE — Patient Instructions (Addendum)
Mild persistent asthma Start Qvar 80 1 puff twice a day to prevent cough or wheeze. Rinse gargle and spit out after use. Ventolin 2 puffs every 4 hours if needed for wheezing or coughing spells. Fexofenadine 180 mg in the morning. Cetirizine 10 mg at night for a runny nose She should have a flu vaccination   Other allergic rhinitis Nasonex 2 sprays in each nostril once a day Pataday 1 drop once a day if needed for itchy eyes    Return in about 6 weeks (around 06/28/2015).

## 2015-05-17 NOTE — Assessment & Plan Note (Signed)
Nasonex 2 sprays in each nostril once a day Pataday 1 drop once a day if needed for itchy eyes

## 2015-05-17 NOTE — Progress Notes (Signed)
FOLLOW UP NOTE  RE: Hannah [name] MRN: [id number] DOB: [birth year] [facility] [place] [address] [place] [id number] Date of Office Visit: 05/17/2015   Chief Complaint: Allergies; Urticaria; and Cough    HPI The patient [place] and did develop coughing and wheezing last week. She also has been having a stuffy nose. She  had to use the Ventolin inhaler while in the mountains. She also had some itching off and on and used Benadryl 25 mg at night.. She normally uses fexofenadine 180 mg in the morning and cetirizine 10 mg at night. She has not had a flu vaccination. She is allergic to grass pollen, tree  pollens,  Weeds, molds and dust mite and  cat Drug Allergies:  Allergies  Allergen Reactions  . Codeine     SENSITIVE  . Montelukast Other (See Comments)    nightmares     Physical Exam: BP 122/78 mmHg  Pulse 84  Temp(Src) 97.7 F (36.5 C)  Resp 18  Ht 5' 3.78" (1.62 m)  Wt 157 lb 10.1 oz (71.5 kg)  BMI 27.24 kg/m2  LMP 03/14/2012  Physical Exam  Constitutional: She appears well-developed and well-nourished.  HENT:  Right Ear: External ear normal.  Left Ear: External ear normal.  Eyes: Conjunctivae are normal.  Neck: Normal range of motion.  Cardiovascular: Normal rate and normal heart sounds.   No murmur heard. Pulmonary/Chest: Effort normal and breath sounds normal.  Lymphadenopathy:    She has no cervical adenopathy.  Skin:  clear  Vitals reviewed.    Diagnostics:      Assessment and Plan:  Mild persistent asthma Start Qvar 80 1 puff twice a day to prevent cough or wheeze. Rinse gargle and spit out after use. Ventolin 2 puffs every 4 hours if needed for wheezing or coughing spells. Fexofenadine 180 mg in the morning. Cetirizine 10 mg at night for a runny nose She should have a flu vaccination   Other allergic rhinitis Nasonex 2 sprays in each  nostril once a day Pataday 1 drop once a day if needed for itchy eyes    Return in about 6 weeks (around 06/28/2015).  Thank you for the opportunity to care for this patient.  Please do not hesitate to contact me with questions.  [facility] [address] [place], [place] [address] [phone]  J. Charyl Bigger, M.D.

## 2015-05-19 ENCOUNTER — Telehealth: Payer: Self-pay | Admitting: Neurology

## 2015-05-19 NOTE — Telephone Encounter (Addendum)
PT walked into office for me to look at her flu shot injection site which was administered on 05/17/15. It is 7.5cm x 11.5cm redness and warm to the touch. Pt states there is slight soreness but no pain. No other symptoms per patient. Pt was advised to use cool compresses and to take Motrin PRN for discomfort and soreness. Is there anything else she needs to do ? Pt does understand that this may not be addressed until tomorrow.

## 2015-05-19 NOTE — Telephone Encounter (Addendum)
Spoke with patient who will continue the benadryl and try cool compresses tonight. Pt will call us for a follow up tomorrow.

## 2015-05-19 NOTE — Telephone Encounter (Signed)
Call patient Agree with suggestion

## 2015-05-20 NOTE — Telephone Encounter (Signed)
Left voicemail with patient to see how she is doing from yesterday 05-19-15. Left message to contact us back when available. Will continue to follow up.

## 2015-05-20 NOTE — Telephone Encounter (Signed)
FYI : Pt called me back this morning at 0945 and pt is doing better. Pt stated that her arm is not as red and the swelling has went down. I advised her to still continue the cool compresses and benadryl PRN. Also advised pt that if she needed anything else or if she has any changes to contact us back. Pt stated she is feeling much better and really appreciated the way we followed up with her.

## 2015-05-24 ENCOUNTER — Other Ambulatory Visit: Payer: Self-pay | Admitting: Gynecology

## 2015-05-24 DIAGNOSIS — N950 Postmenopausal bleeding: Secondary | ICD-10-CM

## 2015-05-26 ENCOUNTER — Telehealth: Payer: Self-pay | Admitting: Gynecology

## 2015-05-26 NOTE — Telephone Encounter (Signed)
05/26/15-I informed pt today that her BC ins will cover her sonohysterogram with TF under her $25 copay.wl

## 2015-06-02 ENCOUNTER — Ambulatory Visit (INDEPENDENT_AMBULATORY_CARE_PROVIDER_SITE_OTHER): Payer: BLUE CROSS/BLUE SHIELD | Admitting: Gynecology

## 2015-06-02 ENCOUNTER — Other Ambulatory Visit: Payer: Self-pay | Admitting: Gynecology

## 2015-06-02 ENCOUNTER — Ambulatory Visit (INDEPENDENT_AMBULATORY_CARE_PROVIDER_SITE_OTHER): Payer: BLUE CROSS/BLUE SHIELD

## 2015-06-02 ENCOUNTER — Encounter: Payer: Self-pay | Admitting: Gynecology

## 2015-06-02 VITALS — BP 120/76

## 2015-06-02 DIAGNOSIS — N950 Postmenopausal bleeding: Secondary | ICD-10-CM

## 2015-06-02 DIAGNOSIS — D251 Intramural leiomyoma of uterus: Secondary | ICD-10-CM | POA: Diagnosis not present

## 2015-06-02 DIAGNOSIS — N3281 Overactive bladder: Secondary | ICD-10-CM | POA: Insufficient documentation

## 2015-06-02 DIAGNOSIS — N951 Menopausal and female climacteric states: Secondary | ICD-10-CM

## 2015-06-02 DIAGNOSIS — E559 Vitamin D deficiency, unspecified: Secondary | ICD-10-CM | POA: Insufficient documentation

## 2015-06-02 MED ORDER — ESTRADIOL 0.1 MG/24HR TD PTTW: 1.0000 | MEDICATED_PATCH | TRANSDERMAL | Status: DC

## 2015-06-02 MED ORDER — PROGESTERONE MICRONIZED 100 MG PO CAPS
100.0000 mg | ORAL_CAPSULE | Freq: Every day | ORAL | Status: DC
Start: 2015-06-02 — End: 2015-08-19

## 2015-06-02 NOTE — Patient Instructions (Signed)
Started on the estrogen patches as we discussed in the progesterone pill at bedtime.  Call me in one month to see how you're doing  Follow up for ultrasound in 2 months

## 2015-06-02 NOTE — Progress Notes (Signed)
Hannah [name] 04-Sep-1960 [id number]        53 y.o.  [id number] Presents for sonohysterogram due to 2 months of postmenopausal spotting. Has not had a regular menses in several years. Is having a lot of issues with increasing hot flashes and night sweats as well as vaginal dryness and atrophic changes leading to no intercourse.  Past medical history,surgical history, problem list, medications, allergies, family history and social history were all reviewed and documented in the EPIC chart.  Directed ROS with pertinent positives and negatives documented in the history of present illness/assessment and plan.  Exam: [place] assistant Filed Vitals:   06/02/15 1451  BP: 120/76   General appearance:  Normal Pelvic external BUS vagina with atrophic changes. Cervix atrophic. Uterus grossly normal midline mobile nontender. Adnexa without masses or tenderness  Ultrasound shows uterus normal size with small myomas measuring 28 mm and 24 mm. Endometrial echo 3.7 mm. Right ovary normal. Left ovary not visualized but without pathology in the left adnexa. Cul-de-sac negative.  Sonohysterogram attempted under sterile technique. Patient was having a lot of pain both from the prior ultrasound and with speculum and cervical manipulation. Was unable to pass the sonohysterogram catheter due to cervical stenosis. Attempted cervical dilatation but was too uncomfortable for the patient. Subsequent procedure was aborted.  Assessment/Plan:  54 y.o. [id number] review situation with the patient and her husband. Ultrasound does show a thin endometrial echo less than 4 mm. Reviewed the ACOG statement as far as highly unlikely she has a significant endometrial abnormality. Most likely her spotting was due to atrophic changes. The patient has done a lot of thinking and she very strongly wants to consider starting HRT due to her other more global symptoms of hot flushes and night sweats. I again reviewed the whole issue of HRT  with them to include the WHI study as well as other studies and the possible increased risk of stroke heart attack DVT breast cancer. Benefits versus risks reviewed and she and I both agree to start HRT. We will start with Vivelle 0.1 mg patch and Prometrium 100 mg at bedtime. She will call me in one month in follow up to see how she's doing. I reviewed the issue as to whether we should pursue endometrial assessment at this time to include going to the operating room to allow for more comfortable exam dilatation and endometrial sampling. Alternatives to include if no further bleeding plan observation or relook at the endometrium in several months and consider attempting the sonohysterogram at that point. After discussion as to the risks benefits of both choices the patient and I both agreed to relook at the endometrium in 2 months with ultrasound and consider sampling at that time.    [name] MD, 3:14 PM 06/02/2015

## 2015-06-28 ENCOUNTER — Ambulatory Visit: Payer: BLUE CROSS/BLUE SHIELD | Admitting: Allergy and Immunology

## 2015-07-12 ENCOUNTER — Ambulatory Visit (INDEPENDENT_AMBULATORY_CARE_PROVIDER_SITE_OTHER): Payer: BLUE CROSS/BLUE SHIELD | Admitting: Pediatrics

## 2015-07-12 ENCOUNTER — Encounter: Payer: Self-pay | Admitting: Pediatrics

## 2015-07-12 ENCOUNTER — Other Ambulatory Visit: Payer: Self-pay | Admitting: Gynecology

## 2015-07-12 VITALS — BP 118/72 | HR 78 | Resp 20

## 2015-07-12 DIAGNOSIS — R102 Pelvic and perineal pain: Secondary | ICD-10-CM

## 2015-07-12 DIAGNOSIS — J4520 Mild intermittent asthma, uncomplicated: Secondary | ICD-10-CM

## 2015-07-12 DIAGNOSIS — N950 Postmenopausal bleeding: Secondary | ICD-10-CM

## 2015-07-12 DIAGNOSIS — L501 Idiopathic urticaria: Secondary | ICD-10-CM

## 2015-07-12 NOTE — Patient Instructions (Signed)
Continue on the treatment plan above She was given information regarding the use of Xolair for chronic urticaria If she needs Proventil more than 2 days per week, she will resume Qvar 80-1 puff once a day She will call me if her chronic urticaria or asthma are not well controlled

## 2015-07-12 NOTE — Progress Notes (Signed)
  [address]  [place] [address] Dept: [phone]  FOLLOW UP NOTE  Patient ID: Hannah [name], female    DOB: [birth year]  Age: 54 y.o. MRN: [id number] Date of Office Visit: 07/12/2015  Assessment Chief Complaint: Follow-up  HPI Cyara Genelle Toller presents for follow-up of chronic urticaria and asthma. With her recent flu vaccination she developed large swelling about 24 hours after he was given. Over the subsequent few days the local reaction subsided. Her chronic urticaria is well controlled. Current medications are Allegra 180 mg in the morning, Zyrtec 10 mg at night, ranitidine 150 mg twice a day and Benadryl and EpiPen if needed. She has stopped using Qvar 80 puff once a day and her asthma is well controlled.She has not had to use Proventil since her last visit   Other current medications are outlined in the chart   Drug Allergies:  Allergies  Allergen Reactions  . Latex Hives, Itching and Rash    With latex use  Rash, itching and hives no breathing issues  . Codeine     SENSITIVE  . Montelukast Other (See Comments)    nightmares    Physical Exam: BP 118/72 mmHg  Pulse 78  Resp 20  LMP 03/14/2012   Physical Exam  Constitutional: She appears well-developed and well-nourished.  HENT:  Eyes normal. Ears normal. Nose normal. Pharynx normal.  Neck: Neck supple. No thyromegaly present.  Cardiovascular:  S1 and S2 normal no murmurs  Pulmonary/Chest:  Clear to percussion and auscultation  Lymphadenopathy:    She has no cervical adenopathy.  Skin:  Clear  Psychiatric: She has a normal mood and affect. Her behavior is normal. Judgment and thought content normal.  Vitals reviewed.   Diagnostics:   FVC 3.56 L FEV1 2.89 L. Predicted FVC 3.15 L predicted FEV1 2.59 L-spirometry in the normal range Assessment and Plan: 1. Chronic idiopathic urticaria   2. Mild intermittent asthma, uncomplicated     No orders of the defined types were placed in this  encounter.    Patient Instructions  Continue on the treatment plan above She was given information regarding the use of Xolair for chronic urticaria If she needs Proventil more than 2 days per week, she will resume Qvar 80-1 puff once a day She will call me if her chronic urticaria or asthma are not well controlled    Return in about 6 months (around 01/09/2016).    Thank you for the opportunity to care for this patient.  Please do not hesitate to contact me with questions.  Penne Lash, M.D.  [facility] [address] [place], [place] [address] [phone]

## 2015-07-27 ENCOUNTER — Telehealth: Payer: Self-pay | Admitting: Gynecology

## 2015-07-27 NOTE — Telephone Encounter (Signed)
07/27/15-I spoke with pt today to let her know that her [facility] will cover the sonohysterogram under her $25 copayment per [email or web address] . The reference number was [id number]

## 2015-08-04 ENCOUNTER — Encounter: Payer: Self-pay | Admitting: Gynecology

## 2015-08-04 ENCOUNTER — Ambulatory Visit (INDEPENDENT_AMBULATORY_CARE_PROVIDER_SITE_OTHER): Payer: BLUE CROSS/BLUE SHIELD

## 2015-08-04 ENCOUNTER — Other Ambulatory Visit: Payer: Self-pay | Admitting: Gynecology

## 2015-08-04 ENCOUNTER — Ambulatory Visit (INDEPENDENT_AMBULATORY_CARE_PROVIDER_SITE_OTHER): Payer: BLUE CROSS/BLUE SHIELD | Admitting: Gynecology

## 2015-08-04 VITALS — BP 124/78

## 2015-08-04 DIAGNOSIS — R102 Pelvic and perineal pain: Secondary | ICD-10-CM

## 2015-08-04 DIAGNOSIS — N882 Stricture and stenosis of cervix uteri: Secondary | ICD-10-CM

## 2015-08-04 DIAGNOSIS — D252 Subserosal leiomyoma of uterus: Secondary | ICD-10-CM

## 2015-08-04 DIAGNOSIS — Z79890 Hormone replacement therapy (postmenopausal): Secondary | ICD-10-CM

## 2015-08-04 DIAGNOSIS — D251 Intramural leiomyoma of uterus: Secondary | ICD-10-CM

## 2015-08-04 DIAGNOSIS — N950 Postmenopausal bleeding: Secondary | ICD-10-CM

## 2015-08-04 MED ORDER — LIDOCAINE HCL 1 % IJ SOLN
10.0000 mL | Freq: Once | INTRAMUSCULAR | Status: AC
  Administered 2015-08-04: 10 mL

## 2015-08-04 NOTE — Patient Instructions (Signed)
Office will call you with biopsy results.  Stay on the hormones as we discussed. Call if the bleeding continues otherwise follow up in February for your annual exam as scheduled

## 2015-08-04 NOTE — Progress Notes (Signed)
Hannah [name] [birth year] [id number]        54 y.o.  [id number] Presents for sonohysterogram. History of postmenopausal spotting intermittently over the past year.  Attempted sonohysterogram 05/2015 unsuccessful due to pain. Endometrial echo less than 4 mm at that point. Patient having significant menopausal symptoms to include hot flashes and sweats as well as significant vaginal pain. Also has history of sling for incontinence. Patient was started on Vivelle 0.1 mg patch and Prometrium 100 mg nightly. She notes spotting has continued on and off but she has had dramatic relief of her hot flashes and sweats as well as less vaginal discomfort.  Past medical history,surgical history, problem list, medications, allergies, family history and social history were all reviewed and documented in the EPIC chart.  Directed ROS with pertinent positives and negatives documented in the history of present illness/assessment and plan.  Exam: [place] assistant Filed Vitals:   08/04/15 1106  BP: 124/78   General appearance:  Normal External BUS vagina with atrophic changes. Minimal discomfort with exam. Cervix grossly normal. Uterus was normal size midline mobile nontender. Adnexa without masses or tenderness.  Ultrasound shows uterus normal size with 2 small myomas 27 mm and 23 mm. Endometrial echo 3.9 mm. Right and left ovaries normal. Cul-de-sac negative. Area anteriorly suggestive of adenomyosis.  Procedure: Cervix was cleansed with Betadine and initial attempts to pass the sonohysterogram catheter were unsuccessful due to stenosis. A paracervical block using 1% lidocaine 8 cc total placed, single-tooth tenaculum anterior lip stabilization, dilatation of the cervix with a disposable green dilator with subsequent easy placement of the catheter. There was some resistance to distention but adequate images were obtained and there is no intra-cavitary pathology. Endometrial specimen was taken with scant return  consistent with her atrophic appearing endometrium on ultrasound.  Assessment/Plan:  54 y.o. [id number] with history as above. Patient has had dramatic improvement in her symptoms using the estrogen. She'll continue with the HRT, follow up for the biopsy results and then she has her annual exam scheduled in February will see me for that.    [name] MD, 11:21 AM 08/04/2015

## 2015-08-19 ENCOUNTER — Other Ambulatory Visit: Payer: Self-pay

## 2015-08-19 MED ORDER — PROGESTERONE MICRONIZED 100 MG PO CAPS: 100.0000 mg | ORAL_CAPSULE | Freq: Every day | ORAL | Status: DC

## 2015-08-19 MED ORDER — ESTRADIOL 0.1 MG/24HR TD PTTW: 1.0000 | MEDICATED_PATCH | TRANSDERMAL | Status: DC

## 2015-09-10 ENCOUNTER — Other Ambulatory Visit: Payer: Self-pay

## 2015-09-10 DIAGNOSIS — Z1231 Encounter for screening mammogram for malignant neoplasm of breast: Secondary | ICD-10-CM

## 2015-09-29 ENCOUNTER — Ambulatory Visit
Admission: RE | Admit: 2015-09-29 | Discharge: 2015-09-29 | Disposition: A | Discharge status: Home / Self Care | Payer: BLUE CROSS/BLUE SHIELD | Source: Ambulatory Visit

## 2015-09-29 DIAGNOSIS — Z1231 Encounter for screening mammogram for malignant neoplasm of breast: Secondary | ICD-10-CM

## 2015-10-05 DIAGNOSIS — G4700 Insomnia, unspecified: Secondary | ICD-10-CM | POA: Insufficient documentation

## 2015-10-05 DIAGNOSIS — K449 Diaphragmatic hernia without obstruction or gangrene: Secondary | ICD-10-CM | POA: Insufficient documentation

## 2015-10-05 DIAGNOSIS — F339 Major depressive disorder, recurrent, unspecified: Secondary | ICD-10-CM | POA: Insufficient documentation

## 2015-10-05 DIAGNOSIS — J45909 Unspecified asthma, uncomplicated: Secondary | ICD-10-CM | POA: Insufficient documentation

## 2015-10-05 DIAGNOSIS — F419 Anxiety disorder, unspecified: Secondary | ICD-10-CM | POA: Insufficient documentation

## 2015-10-05 DIAGNOSIS — I201 Angina pectoris with documented spasm: Secondary | ICD-10-CM | POA: Insufficient documentation

## 2015-10-12 ENCOUNTER — Encounter: Payer: Self-pay | Admitting: Gynecology

## 2015-10-12 ENCOUNTER — Encounter: Payer: BLUE CROSS/BLUE SHIELD | Admitting: Gynecology

## 2015-10-12 ENCOUNTER — Ambulatory Visit (INDEPENDENT_AMBULATORY_CARE_PROVIDER_SITE_OTHER): Payer: BLUE CROSS/BLUE SHIELD | Admitting: Gynecology

## 2015-10-12 VITALS — BP 122/76 | Ht 64.0 in | Wt 153.0 lb

## 2015-10-12 DIAGNOSIS — Z1329 Encounter for screening for other suspected endocrine disorder: Secondary | ICD-10-CM | POA: Diagnosis not present

## 2015-10-12 DIAGNOSIS — N952 Postmenopausal atrophic vaginitis: Secondary | ICD-10-CM

## 2015-10-12 DIAGNOSIS — Z1321 Encounter for screening for nutritional disorder: Secondary | ICD-10-CM

## 2015-10-12 DIAGNOSIS — Z13220 Encounter for screening for lipoid disorders: Secondary | ICD-10-CM

## 2015-10-12 DIAGNOSIS — Z01419 Encounter for gynecological examination (general) (routine) without abnormal findings: Secondary | ICD-10-CM | POA: Diagnosis not present

## 2015-10-12 DIAGNOSIS — Z79890 Hormone replacement therapy (postmenopausal): Secondary | ICD-10-CM

## 2015-10-12 LAB — CBC WITH DIFFERENTIAL/PLATELET
Basophils Absolute: 0 10*3/uL (ref 0.0–0.1)
Basophils Relative: 0 % (ref 0–1)
EOS PCT: 2 % (ref 0–5)
Eosinophils Absolute: 0.2 10*3/uL (ref 0.0–0.7)
HEMATOCRIT: 42.2 % (ref 36.0–46.0)
HEMOGLOBIN: 14.8 g/dL (ref 12.0–15.0)
LYMPHS PCT: 30 % (ref 12–46)
Lymphs Abs: 2.8 10*3/uL (ref 0.7–4.0)
MCH: 31.4 pg (ref 26.0–34.0)
MCHC: 35.1 g/dL (ref 30.0–36.0)
MCV: 89.4 fL (ref 78.0–100.0)
MONO ABS: 0.9 10*3/uL (ref 0.1–1.0)
MONOS PCT: 10 % (ref 3–12)
MPV: 10.2 fL (ref 8.6–12.4)
NEUTROS ABS: 5.3 10*3/uL (ref 1.7–7.7)
Neutrophils Relative %: 58 % (ref 43–77)
Platelets: 446 10*3/uL — ABNORMAL HIGH (ref 150–400)
RBC: 4.72 MIL/uL (ref 3.87–5.11)
RDW: 13.2 % (ref 11.5–15.5)
WBC: 9.2 10*3/uL (ref 4.0–10.5)

## 2015-10-12 LAB — COMPREHENSIVE METABOLIC PANEL
ALBUMIN: 4.5 g/dL (ref 3.6–5.1)
ALT: 13 U/L (ref 6–29)
AST: 18 U/L (ref 10–35)
Alkaline Phosphatase: 83 U/L (ref 33–130)
BILIRUBIN TOTAL: 0.4 mg/dL (ref 0.2–1.2)
BUN: 12 mg/dL (ref 7–25)
CALCIUM: 9.6 mg/dL (ref 8.6–10.4)
CHLORIDE: 100 mmol/L (ref 98–110)
CO2: 23 mmol/L (ref 20–31)
Creat: 0.79 mg/dL (ref 0.50–1.05)
Glucose, Bld: 82 mg/dL (ref 65–99)
POTASSIUM: 4.3 mmol/L (ref 3.5–5.3)
Sodium: 136 mmol/L (ref 135–146)
Total Protein: 7.4 g/dL (ref 6.1–8.1)

## 2015-10-12 LAB — LIPID PANEL
CHOLESTEROL: 224 mg/dL — AB (ref 125–200)
HDL: 92 mg/dL (ref >=46)
LDL Cholesterol: 112 mg/dL (ref <130)
TRIGLYCERIDES: 98 mg/dL (ref <150)
Total CHOL/HDL Ratio: 2.4 Ratio (ref <=5.0)
VLDL: 20 mg/dL (ref <30)

## 2015-10-12 LAB — TSH: TSH: 1.55 mIU/L

## 2015-10-12 MED ORDER — ESTRADIOL 0.1 MG/24HR TD PTTW: 1.0000 | MEDICATED_PATCH | TRANSDERMAL | Status: DC

## 2015-10-12 MED ORDER — PROGESTERONE MICRONIZED 100 MG PO CAPS: 100.0000 mg | ORAL_CAPSULE | Freq: Every day | ORAL | Status: DC

## 2015-10-12 NOTE — Patient Instructions (Signed)

## 2015-10-12 NOTE — Progress Notes (Signed)
Hannah [name] Apr 06, 1961 [id number]        55 y.o.  [id number]  for annual exam.  Doing well without complaints  Past medical history,surgical history, problem list, medications, allergies, family history and social history were all reviewed and documented as reviewed in the EPIC chart.  ROS:  Performed with pertinent positives and negatives included in the history, assessment and plan.   Additional significant findings :  none   Exam: [place] assistant Filed Vitals:   10/12/15 1053  BP: 122/76  Height: 5\' 4"  (1.626 m)  Weight: 153 lb (69.4 kg)   General appearance:  Normal affect, orientation and appearance. Skin: Grossly normal HEENT: Without gross lesions.  No cervical or supraclavicular adenopathy. Thyroid normal.  Lungs:  Clear without wheezing, rales or rhonchi Cardiac: RR, without RMG Abdominal:  Soft, nontender, without masses, guarding, rebound, organomegaly or hernia Breasts:  Examined lying and sitting without masses, retractions, discharge or axillary adenopathy. Pelvic:  Ext/BUS/vagina with atrophic changes.  Trans-obturator sling palpable submucosally anterior vagina  Cervix normal  Uterus anteverted, normal size, shape and contour, midline and mobile nontender   Adnexa without masses or tenderness    Anus and perineum normal   Rectovaginal normal sphincter tone without palpated masses or tenderness.    Assessment/Plan:  55 y.o. [id number] female for annual exam.   1. Postmenopausal/atrophic genital changes/HRT.  Patient continues on Vivelle 0.1 mg patch and Prometrium 100 mg nightly. Has had a dramatic response with resolution of her dyspareunia, some improvement in her bladder function and just and overall improvement in feeling of well-being. Patient strongly wants to continue. I again reviewed the risks to include stroke heart attack DVT of breast cancer. Patient understands and accepts I refilled her 1 year. 2. History of urinary incontinence followed  by Dr. Gaynelle Arabian. Status post sling and currently on Myrbetriq.  Patient's arranging appointment to follow up with him now. Check urinalysis. 3. Mammography 09/29/2015. We'll continue with annual mammography when due. SBE monthly reviewed. 4. Pap smear/HPV 08/2013 negative. No Pap smear done today. No history of significantly abnormal Pap smears. 5. Colonoscopy 2015. Repeat at their recommended interval. 6. Health maintenance. Baseline CBC, comprehensive metabolic panel, lipid profile, urinalysis, TSH and vitamin D ordered. Follow up in one year, sooner as needed.   [name] MD, 11:20 AM 10/12/2015

## 2015-10-13 LAB — URINALYSIS W MICROSCOPIC + REFLEX CULTURE
BILIRUBIN URINE: NEGATIVE
Bacteria, UA: NONE SEEN [HPF]
CRYSTALS: NONE SEEN [HPF]
Casts: NONE SEEN [LPF]
Glucose, UA: NEGATIVE
HGB URINE DIPSTICK: NEGATIVE
KETONES UR: NEGATIVE
Nitrite: NEGATIVE
PH: 6 (ref 5.0–8.0)
Protein, ur: NEGATIVE
SPECIFIC GRAVITY, URINE: 1.019 (ref 1.001–1.035)
Yeast: NONE SEEN [HPF]

## 2015-10-13 LAB — VITAMIN D 25 HYDROXY (VIT D DEFICIENCY, FRACTURES): VIT D 25 HYDROXY: 35 ng/mL (ref 30–100)

## 2015-10-14 LAB — URINE CULTURE
Colony Count: NO GROWTH
ORGANISM ID, BACTERIA: NO GROWTH

## 2015-10-15 DIAGNOSIS — Z86010 Personal history of colonic polyps: Secondary | ICD-10-CM | POA: Insufficient documentation

## 2015-10-19 ENCOUNTER — Encounter: Payer: BLUE CROSS/BLUE SHIELD | Admitting: Gynecology

## 2015-11-10 ENCOUNTER — Observation Stay: Payer: Managed Care, Other (non HMO) | Admitting: Anesthesiology

## 2015-11-10 ENCOUNTER — Encounter: Payer: Self-pay | Admitting: *Deleted

## 2015-11-10 ENCOUNTER — Observation Stay
Admission: EM | Admit: 2015-11-10 | Discharge: 2015-11-11 | Disposition: A | Discharge status: Home / Self Care | Payer: Managed Care, Other (non HMO) | Attending: Surgery | Admitting: Surgery

## 2015-11-10 ENCOUNTER — Emergency Department: Payer: Managed Care, Other (non HMO)

## 2015-11-10 ENCOUNTER — Encounter
Admission: EM | Disposition: A | Discharge status: Home / Self Care | Payer: Self-pay | Source: Home / Self Care | Attending: Emergency Medicine

## 2015-11-10 DIAGNOSIS — Z9889 Other specified postprocedural states: Secondary | ICD-10-CM | POA: Insufficient documentation

## 2015-11-10 DIAGNOSIS — K219 Gastro-esophageal reflux disease without esophagitis: Secondary | ICD-10-CM | POA: Diagnosis not present

## 2015-11-10 DIAGNOSIS — Z8349 Family history of other endocrine, nutritional and metabolic diseases: Secondary | ICD-10-CM | POA: Diagnosis not present

## 2015-11-10 DIAGNOSIS — F419 Anxiety disorder, unspecified: Secondary | ICD-10-CM | POA: Insufficient documentation

## 2015-11-10 DIAGNOSIS — R109 Unspecified abdominal pain: Secondary | ICD-10-CM | POA: Diagnosis present

## 2015-11-10 DIAGNOSIS — K819 Cholecystitis, unspecified: Secondary | ICD-10-CM | POA: Diagnosis present

## 2015-11-10 DIAGNOSIS — Z79899 Other long term (current) drug therapy: Secondary | ICD-10-CM | POA: Diagnosis not present

## 2015-11-10 DIAGNOSIS — K449 Diaphragmatic hernia without obstruction or gangrene: Secondary | ICD-10-CM | POA: Insufficient documentation

## 2015-11-10 DIAGNOSIS — Z7952 Long term (current) use of systemic steroids: Secondary | ICD-10-CM | POA: Insufficient documentation

## 2015-11-10 DIAGNOSIS — J309 Allergic rhinitis, unspecified: Secondary | ICD-10-CM | POA: Insufficient documentation

## 2015-11-10 DIAGNOSIS — R079 Chest pain, unspecified: Secondary | ICD-10-CM | POA: Diagnosis not present

## 2015-11-10 DIAGNOSIS — Z87891 Personal history of nicotine dependence: Secondary | ICD-10-CM | POA: Diagnosis not present

## 2015-11-10 DIAGNOSIS — K810 Acute cholecystitis: Secondary | ICD-10-CM | POA: Diagnosis present

## 2015-11-10 DIAGNOSIS — Z885 Allergy status to narcotic agent status: Secondary | ICD-10-CM | POA: Diagnosis not present

## 2015-11-10 DIAGNOSIS — Z91040 Latex allergy status: Secondary | ICD-10-CM | POA: Insufficient documentation

## 2015-11-10 DIAGNOSIS — J45909 Unspecified asthma, uncomplicated: Secondary | ICD-10-CM | POA: Diagnosis not present

## 2015-11-10 DIAGNOSIS — Z888 Allergy status to other drugs, medicaments and biological substances status: Secondary | ICD-10-CM | POA: Insufficient documentation

## 2015-11-10 DIAGNOSIS — K8046 Calculus of bile duct with acute and chronic cholecystitis without obstruction: Secondary | ICD-10-CM | POA: Diagnosis not present

## 2015-11-10 DIAGNOSIS — Z8249 Family history of ischemic heart disease and other diseases of the circulatory system: Secondary | ICD-10-CM | POA: Insufficient documentation

## 2015-11-10 DIAGNOSIS — Z800 Family history of malignant neoplasm of digestive organs: Secondary | ICD-10-CM | POA: Diagnosis not present

## 2015-11-10 DIAGNOSIS — Z801 Family history of malignant neoplasm of trachea, bronchus and lung: Secondary | ICD-10-CM | POA: Diagnosis not present

## 2015-11-10 HISTORY — PX: CHOLECYSTECTOMY: SHX55

## 2015-11-10 LAB — HEPATIC FUNCTION PANEL
ALBUMIN: 4.1 g/dL (ref 3.5–5.0)
ALT: 17 U/L (ref 14–54)
AST: 30 U/L (ref 15–41)
Alkaline Phosphatase: 76 U/L (ref 38–126)
BILIRUBIN INDIRECT: 0.3 mg/dL (ref 0.3–0.9)
Bilirubin, Direct: 0.1 mg/dL (ref 0.1–0.5)
TOTAL PROTEIN: 6.8 g/dL (ref 6.5–8.1)
Total Bilirubin: 0.4 mg/dL (ref 0.3–1.2)

## 2015-11-10 LAB — BASIC METABOLIC PANEL
ANION GAP: 6 (ref 5–15)
BUN: 13 mg/dL (ref 6–20)
CO2: 24 mmol/L (ref 22–32)
Calcium: 8.6 mg/dL — ABNORMAL LOW (ref 8.9–10.3)
Chloride: 102 mmol/L (ref 101–111)
Creatinine, Ser: 0.71 mg/dL (ref 0.44–1.00)
Glucose, Bld: 76 mg/dL (ref 65–99)
POTASSIUM: 3.6 mmol/L (ref 3.5–5.1)
SODIUM: 132 mmol/L — AB (ref 135–145)

## 2015-11-10 LAB — LIPASE, BLOOD: LIPASE: 29 U/L (ref 11–51)

## 2015-11-10 LAB — CBC
HEMATOCRIT: 39.1 % (ref 35.0–47.0)
HEMOGLOBIN: 13.5 g/dL (ref 12.0–16.0)
MCH: 31.1 pg (ref 26.0–34.0)
MCHC: 34.5 g/dL (ref 32.0–36.0)
MCV: 90.1 fL (ref 80.0–100.0)
Platelets: 380 10*3/uL (ref 150–440)
RBC: 4.34 MIL/uL (ref 3.80–5.20)
RDW: 13 % (ref 11.5–14.5)
WBC: 7.5 10*3/uL (ref 3.6–11.0)

## 2015-11-10 LAB — TROPONIN I: Troponin I: <0.03 ng/mL (ref <0.031)

## 2015-11-10 SURGERY — LAPAROSCOPIC CHOLECYSTECTOMY
Anesthesia: General

## 2015-11-10 MED ORDER — FLUTICASONE PROPIONATE 50 MCG/ACT NA SUSP
1.0000 | Freq: Every day | NASAL | Status: DC
  Filled 2015-11-10: qty 16

## 2015-11-10 MED ORDER — FENTANYL CITRATE (PF) 100 MCG/2ML IJ SOLN
INTRAMUSCULAR | Status: DC | PRN
  Administered 2015-11-10 (×2): 100 ug via INTRAVENOUS

## 2015-11-10 MED ORDER — HEPARIN SODIUM (PORCINE) 5000 UNIT/ML IJ SOLN: 5000.0000 [IU] | Freq: Three times a day (TID) | INTRAMUSCULAR | Status: DC

## 2015-11-10 MED ORDER — LORATADINE 10 MG PO TABS
10.0000 mg | ORAL_TABLET | Freq: Every day | ORAL | Status: DC
  Filled 2015-11-10: qty 1

## 2015-11-10 MED ORDER — HYDROMORPHONE HCL 1 MG/ML IJ SOLN
0.5000 mg | Freq: Once | INTRAMUSCULAR | Status: AC
  Administered 2015-11-10: 0.5 mg via INTRAVENOUS
  Filled 2015-11-10: qty 1

## 2015-11-10 MED ORDER — HEPARIN SODIUM (PORCINE) 5000 UNIT/ML IJ SOLN
5000.0000 [IU] | Freq: Three times a day (TID) | INTRAMUSCULAR | Status: DC
  Administered 2015-11-10 – 2015-11-11 (×3): 5000 [IU] via SUBCUTANEOUS
  Filled 2015-11-10 (×2): qty 1

## 2015-11-10 MED ORDER — FENTANYL CITRATE (PF) 100 MCG/2ML IJ SOLN
50.0000 ug | Freq: Once | INTRAMUSCULAR | Status: DC
  Filled 2015-11-10: qty 2

## 2015-11-10 MED ORDER — ONDANSETRON HCL 4 MG PO TABS: 4.0000 mg | ORAL_TABLET | Freq: Four times a day (QID) | ORAL | Status: DC | PRN

## 2015-11-10 MED ORDER — VITAMIN D 1000 UNITS PO TABS
2000.0000 [IU] | ORAL_TABLET | Freq: Every day | ORAL | Status: DC
  Filled 2015-11-10: qty 2

## 2015-11-10 MED ORDER — IPRATROPIUM-ALBUTEROL 0.5-2.5 (3) MG/3ML IN SOLN
RESPIRATORY_TRACT | Status: AC
  Administered 2015-11-10: 3 mL via RESPIRATORY_TRACT
  Filled 2015-11-10: qty 3

## 2015-11-10 MED ORDER — ONDANSETRON HCL 4 MG/2ML IJ SOLN: 4.0000 mg | Freq: Four times a day (QID) | INTRAMUSCULAR | Status: DC | PRN

## 2015-11-10 MED ORDER — NON FORMULARY
10.0000 mg | Freq: Every day | Status: DC
Start: 2015-11-10 — End: 2015-11-10

## 2015-11-10 MED ORDER — DEXAMETHASONE SODIUM PHOSPHATE 10 MG/ML IJ SOLN
INTRAMUSCULAR | Status: DC | PRN
  Administered 2015-11-10: 10 mg via INTRAVENOUS

## 2015-11-10 MED ORDER — FAMOTIDINE 20 MG PO TABS: 20.0000 mg | ORAL_TABLET | Freq: Every day | ORAL | Status: DC

## 2015-11-10 MED ORDER — SUCCINYLCHOLINE CHLORIDE 20 MG/ML IJ SOLN
INTRAMUSCULAR | Status: DC | PRN
  Administered 2015-11-10: 100 mg via INTRAVENOUS

## 2015-11-10 MED ORDER — MIDAZOLAM HCL 2 MG/2ML IJ SOLN
INTRAMUSCULAR | Status: DC | PRN
  Administered 2015-11-10: 2 mg via INTRAVENOUS

## 2015-11-10 MED ORDER — HYDROMORPHONE HCL 1 MG/ML IJ SOLN
1.0000 mg | INTRAMUSCULAR | Status: DC | PRN
  Administered 2015-11-10: 1 mg via INTRAVENOUS

## 2015-11-10 MED ORDER — PROMETHAZINE HCL 25 MG/ML IJ SOLN
12.5000 mg | Freq: Once | INTRAMUSCULAR | Status: DC
  Filled 2015-11-10: qty 1

## 2015-11-10 MED ORDER — HEPARIN SODIUM (PORCINE) 5000 UNIT/ML IJ SOLN
INTRAMUSCULAR | Status: AC
  Administered 2015-11-10: 5000 [IU] via SUBCUTANEOUS
  Filled 2015-11-10: qty 1

## 2015-11-10 MED ORDER — FENTANYL CITRATE (PF) 100 MCG/2ML IJ SOLN
25.0000 ug | INTRAMUSCULAR | Status: DC | PRN
  Administered 2015-11-10 (×4): 25 ug via INTRAVENOUS

## 2015-11-10 MED ORDER — DEXTROSE IN LACTATED RINGERS 5 % IV SOLN: INTRAVENOUS | Status: DC

## 2015-11-10 MED ORDER — NON FORMULARY: 150.0000 mg | Freq: Every day | Status: DC

## 2015-11-10 MED ORDER — OXYCODONE HCL 5 MG PO TABS: 5.0000 mg | ORAL_TABLET | Freq: Once | ORAL | Status: DC | PRN

## 2015-11-10 MED ORDER — CALCIUM CARBONATE ANTACID 500 MG PO CHEW
600.0000 mg | CHEWABLE_TABLET | Freq: Every day | ORAL | Status: DC
  Filled 2015-11-10: qty 3

## 2015-11-10 MED ORDER — HYDROMORPHONE HCL 1 MG/ML IJ SOLN
1.0000 mg | Freq: Once | INTRAMUSCULAR | Status: AC
  Administered 2015-11-10: 1 mg via INTRAVENOUS
  Filled 2015-11-10: qty 1

## 2015-11-10 MED ORDER — GI COCKTAIL ~~LOC~~
30.0000 mL | Freq: Once | ORAL | Status: AC
  Administered 2015-11-10: 30 mL via ORAL
  Filled 2015-11-10: qty 30

## 2015-11-10 MED ORDER — ADULT MULTIVITAMIN W/MINERALS CH
1.0000 | ORAL_TABLET | Freq: Every day | ORAL | Status: DC
  Filled 2015-11-10: qty 1

## 2015-11-10 MED ORDER — LIDOCAINE HCL (CARDIAC) 20 MG/ML IV SOLN
INTRAVENOUS | Status: DC | PRN
  Administered 2015-11-10: 10 mg via INTRAVENOUS

## 2015-11-10 MED ORDER — SODIUM CHLORIDE 0.9 % IV BOLUS (SEPSIS)
500.0000 mL | Freq: Once | INTRAVENOUS | Status: AC
  Administered 2015-11-10: 500 mL via INTRAVENOUS

## 2015-11-10 MED ORDER — LACTATED RINGERS IV SOLN
INTRAVENOUS | Status: DC
Start: 2015-11-10 — End: 2015-11-10
  Administered 2015-11-10: 15:00:00 via INTRAVENOUS

## 2015-11-10 MED ORDER — BUDESONIDE 0.25 MG/2ML IN SUSP
0.2500 mg | Freq: Two times a day (BID) | RESPIRATORY_TRACT | Status: DC
  Filled 2015-11-10: qty 2

## 2015-11-10 MED ORDER — CEFAZOLIN SODIUM-DEXTROSE 2-4 GM/100ML-% IV SOLN
INTRAVENOUS | Status: AC
  Filled 2015-11-10: qty 100

## 2015-11-10 MED ORDER — ALPRAZOLAM 1 MG PO TABS
1.0000 mg | ORAL_TABLET | Freq: Two times a day (BID) | ORAL | Status: DC | PRN
  Administered 2015-11-10: 1 mg via ORAL
  Filled 2015-11-10: qty 1

## 2015-11-10 MED ORDER — HYDROMORPHONE HCL 1 MG/ML IJ SOLN
INTRAMUSCULAR | Status: AC
  Filled 2015-11-10: qty 1

## 2015-11-10 MED ORDER — BECLOMETHASONE DIPROPIONATE 80 MCG/ACT IN AERS: 1.0000 | INHALATION_SPRAY | Freq: Two times a day (BID) | RESPIRATORY_TRACT | Status: DC

## 2015-11-10 MED ORDER — NON FORMULARY
12.5000 mg | Freq: Once | Status: DC
  Administered 2015-11-10: 6.25 mg via INTRAVENOUS

## 2015-11-10 MED ORDER — ROCURONIUM BROMIDE 100 MG/10ML IV SOLN
INTRAVENOUS | Status: DC | PRN
  Administered 2015-11-10: 30 mg via INTRAVENOUS
  Administered 2015-11-10: 10 mg via INTRAVENOUS

## 2015-11-10 MED ORDER — FAMOTIDINE 20 MG PO TABS
10.0000 mg | ORAL_TABLET | Freq: Every day | ORAL | Status: DC
  Administered 2015-11-11: 10 mg via ORAL
  Filled 2015-11-10: qty 1

## 2015-11-10 MED ORDER — FENTANYL CITRATE (PF) 100 MCG/2ML IJ SOLN
INTRAMUSCULAR | Status: AC
  Filled 2015-11-10: qty 2

## 2015-11-10 MED ORDER — ALBUTEROL SULFATE (2.5 MG/3ML) 0.083% IN NEBU
2.5000 mg | INHALATION_SOLUTION | RESPIRATORY_TRACT | Status: DC | PRN
  Administered 2015-11-11: 2.5 mg via RESPIRATORY_TRACT
  Filled 2015-11-10: qty 3

## 2015-11-10 MED ORDER — CEFAZOLIN SODIUM-DEXTROSE 2-4 GM/100ML-% IV SOLN
2.0000 g | INTRAVENOUS | Status: AC
  Administered 2015-11-10: 2 g via INTRAVENOUS
  Filled 2015-11-10: qty 100

## 2015-11-10 MED ORDER — CETIRIZINE HCL 10 MG PO TABS
10.0000 mg | ORAL_TABLET | Freq: Every day | ORAL | Status: DC
  Administered 2015-11-10: 10 mg via ORAL
  Filled 2015-11-10 (×2): qty 1

## 2015-11-10 MED ORDER — OXYCODONE HCL 5 MG PO TABS
5.0000 mg | ORAL_TABLET | ORAL | Status: DC | PRN
  Administered 2015-11-10 – 2015-11-11 (×3): 5 mg via ORAL
  Filled 2015-11-10 (×3): qty 1

## 2015-11-10 MED ORDER — ONDANSETRON HCL 4 MG/2ML IJ SOLN
INTRAMUSCULAR | Status: DC | PRN
  Administered 2015-11-10: 4 mg via INTRAVENOUS

## 2015-11-10 MED ORDER — MIRABEGRON ER 25 MG PO TB24
25.0000 mg | ORAL_TABLET | Freq: Every day | ORAL | Status: DC
  Filled 2015-11-10: qty 1

## 2015-11-10 MED ORDER — PHENTERMINE HCL 30 MG PO CAPS
30.0000 mg | ORAL_CAPSULE | ORAL | Status: DC
Start: 2015-11-11 — End: 2015-11-10

## 2015-11-10 MED ORDER — PHENTERMINE HCL 15 MG PO CAPS: 15.0000 mg | ORAL_CAPSULE | Freq: Every day | ORAL | Status: DC

## 2015-11-10 MED ORDER — BUPIVACAINE-EPINEPHRINE (PF) 0.25% -1:200000 IJ SOLN
INTRAMUSCULAR | Status: DC | PRN
  Administered 2015-11-10: 30 mL

## 2015-11-10 MED ORDER — FENTANYL CITRATE (PF) 100 MCG/2ML IJ SOLN
50.0000 ug | Freq: Once | INTRAMUSCULAR | Status: AC
  Administered 2015-11-10: 50 ug via INTRAVENOUS

## 2015-11-10 MED ORDER — KETOROLAC TROMETHAMINE 30 MG/ML IJ SOLN
INTRAMUSCULAR | Status: DC | PRN
  Administered 2015-11-10: 30 mg via INTRAVENOUS

## 2015-11-10 MED ORDER — PROPOFOL 10 MG/ML IV BOLUS
INTRAVENOUS | Status: DC | PRN
  Administered 2015-11-10: 120 mg via INTRAVENOUS

## 2015-11-10 MED ORDER — HYDROMORPHONE HCL 1 MG/ML IJ SOLN
0.2500 mg | INTRAMUSCULAR | Status: DC | PRN
  Administered 2015-11-10: 0.5 mg via INTRAVENOUS

## 2015-11-10 MED ORDER — OXYCODONE HCL 5 MG/5ML PO SOLN: 5.0000 mg | Freq: Once | ORAL | Status: DC | PRN

## 2015-11-10 MED ORDER — IPRATROPIUM-ALBUTEROL 0.5-2.5 (3) MG/3ML IN SOLN: 3.0000 mL | Freq: Once | RESPIRATORY_TRACT | Status: DC

## 2015-11-10 MED ORDER — PROGESTERONE MICRONIZED 100 MG PO CAPS
100.0000 mg | ORAL_CAPSULE | Freq: Every day | ORAL | Status: DC
  Administered 2015-11-10: 100 mg via ORAL
  Filled 2015-11-10 (×2): qty 1

## 2015-11-10 MED ORDER — IPRATROPIUM-ALBUTEROL 0.5-2.5 (3) MG/3ML IN SOLN
3.0000 mL | Freq: Once | RESPIRATORY_TRACT | Status: AC
  Administered 2015-11-10: 3 mL via RESPIRATORY_TRACT

## 2015-11-10 MED ORDER — HYDROMORPHONE HCL 1 MG/ML IJ SOLN
1.0000 mg | INTRAMUSCULAR | Status: DC | PRN
Start: 2015-11-10 — End: 2015-11-11
  Filled 2015-11-10: qty 1

## 2015-11-10 SURGICAL SUPPLY — 44 items
ADH LQ OCL WTPRF AMP STRL LF (MISCELLANEOUS) ×1
ADHESIVE MASTISOL STRL (MISCELLANEOUS) ×2 IMPLANT
APPLIER CLIP ROT 10 11.4 M/L (STAPLE) ×2
APR CLP MED LRG 11.4X10 (STAPLE) ×1
BLADE SURG SZ11 CARB STEEL (BLADE) ×2 IMPLANT
CANISTER SUCT 1200ML W/VALVE (MISCELLANEOUS) ×2 IMPLANT
CATH CHOLANGI 4FR 420404F (CATHETERS) IMPLANT
CHLORAPREP W/TINT 26ML (MISCELLANEOUS) ×2 IMPLANT
CLIP APPLIE ROT 10 11.4 M/L (STAPLE) ×1 IMPLANT
CONRAY 60ML FOR OR (MISCELLANEOUS) IMPLANT
DRAPE C-ARM XRAY 36X54 (DRAPES) IMPLANT
ELECT REM PT RETURN 9FT ADLT (ELECTROSURGICAL) ×2
ELECTRODE REM PT RTRN 9FT ADLT (ELECTROSURGICAL) ×1 IMPLANT
ENDOPOUCH RETRIEVER 10 (MISCELLANEOUS) ×2 IMPLANT
GAUZE SPONGE NON-WVN 2X2 STRL (MISCELLANEOUS) ×4 IMPLANT
GLOVE BIO SURGEON STRL SZ8 (GLOVE) ×2 IMPLANT
GOWN STRL REUS W/ TWL LRG LVL3 (GOWN DISPOSABLE) ×4 IMPLANT
GOWN STRL REUS W/TWL LRG LVL3 (GOWN DISPOSABLE) ×8
IRRIGATION STRYKERFLOW (MISCELLANEOUS) IMPLANT
IRRIGATOR STRYKERFLOW (MISCELLANEOUS)
IV CATH ANGIO 12GX3 LT BLUE (NEEDLE) ×2 IMPLANT
IV NS 1000ML (IV SOLUTION)
IV NS 1000ML BAXH (IV SOLUTION) IMPLANT
JACKSON PRATT 10 (INSTRUMENTS) IMPLANT
KIT RM TURNOVER STRD PROC AR (KITS) ×2 IMPLANT
LABEL OR SOLS (LABEL) ×2 IMPLANT
NDL SAFETY 22GX1.5 (NEEDLE) ×2 IMPLANT
NEEDLE VERESS 14GA 120MM (NEEDLE) ×2 IMPLANT
NS IRRIG 500ML POUR BTL (IV SOLUTION) ×2 IMPLANT
PACK LAP CHOLECYSTECTOMY (MISCELLANEOUS) ×2 IMPLANT
SCISSORS METZENBAUM CVD 33 (INSTRUMENTS) ×2 IMPLANT
SLEEVE ENDOPATH XCEL 5M (ENDOMECHANICALS) ×4 IMPLANT
SPONGE EXCIL AMD DRAIN 4X4 6P (MISCELLANEOUS) IMPLANT
SPONGE LAP 18X18 5 PK (GAUZE/BANDAGES/DRESSINGS) ×2 IMPLANT
SPONGE VERSALON 2X2 STRL (MISCELLANEOUS) ×8
STRIP CLOSURE SKIN 1/2X4 (GAUZE/BANDAGES/DRESSINGS) ×2 IMPLANT
SUT MNCRL 4-0 (SUTURE) ×2
SUT MNCRL 4-0 27XMFL (SUTURE) ×1
SUT VICRYL 0 AB UR-6 (SUTURE) ×2 IMPLANT
SUTURE MNCRL 4-0 27XMF (SUTURE) ×1 IMPLANT
SYR 20CC LL (SYRINGE) ×2 IMPLANT
TROCAR XCEL NON-BLD 11X100MML (ENDOMECHANICALS) ×2 IMPLANT
TROCAR XCEL NON-BLD 5MMX100MML (ENDOMECHANICALS) ×2 IMPLANT
TUBING INSUFFLATOR HI FLOW (MISCELLANEOUS) ×2 IMPLANT

## 2015-11-10 NOTE — H&P (Signed)
Hannah [name] is an 55 y.o. female.    Chief Complaint: Upper abdominal and chest pain  HPI: This patient is been worked up as an outpatient and is in the process of working up upper chest pain and right sided pain upper abdominal pain radiating through to her back. She has had this going on for several weeks since February and it comes and goes but this morning when she was driving to work it came on severely and she points to the right upper quadrant and lower chest on the right side is had some nausea but no emesis eyes jaundice or acholic stools and no fevers or chills episode is been unrelenting and she appears quite uncomfortable. She works in [employer] lives in [place] she's been going through a change in her primary care physicians in [place].  Past Medical History  Diagnosis Date  . Anxiety   . Herpes labialis   . Environmental allergies   . Urinary incontinence   . Urticaria   . Asthma   . Hiatal hernia     Past Surgical History  Procedure Laterality Date  . Abdominal surgery  2001    MYOMECTOMY   . Hysteroscopy      D&C, HYST X 3  . Bladder suspension  2003    [place] transobturator pubovaginal sling Dr Gaynelle Arabian    Family History  Problem Relation Age of Onset  . Cancer Maternal Uncle     2 MATERNAL UNCLES W COLON CA  . Heart disease Maternal Grandmother   . Hypertension Maternal Grandmother   . Cancer Maternal Grandfather     Lung  . Cancer Paternal Grandfather     Lung  . Myasthenia gravis Father    Social History:  reports that she quit smoking about 2 years ago. Her smoking use included Cigarettes. She has never used smokeless tobacco. She reports that she drinks about 1.8 oz of alcohol per week. She reports that she does not use illicit drugs.  Allergies:  Allergies  Allergen Reactions  . Latex Hives, Itching and Rash    With latex use  Rash, itching and hives no breathing issues  . Codeine Nausea And Vomiting    SENSITIVE  . Montelukast  Other (See Comments)    nightmares     (Not in a hospital admission)   Review of Systems  Constitutional: Negative.   HENT: Negative.   Eyes: Negative.   Respiratory: Negative.   Cardiovascular: Negative.   Gastrointestinal: Positive for nausea and abdominal pain. Negative for heartburn, vomiting, diarrhea, constipation, blood in stool and melena.  Genitourinary: Negative.   Musculoskeletal: Negative.   Skin: Negative.   Neurological: Negative.   Endo/Heme/Allergies: Negative.   Psychiatric/Behavioral: Negative.      Physical Exam:  BP 143/86 mmHg  Pulse 70  Temp(Src) 98.1 F (36.7 C) (Oral)  Resp 13  Ht 5' 4"  (1.626 m)  Wt 150 lb (68.04 kg)  BMI 25.73 kg/m2  SpO2 100%  LMP 03/14/2012  Physical Exam  Constitutional: She is oriented to person, place, and time. She appears distressed.  HENT:  Head: Normocephalic and atraumatic.  Eyes: Pupils are equal, round, and reactive to light. Right eye exhibits no discharge. Left eye exhibits no discharge. No scleral icterus.  Neck: Normal range of motion.  Cardiovascular: Normal rate, regular rhythm and normal heart sounds.   Pulmonary/Chest: Effort normal and breath sounds normal. No respiratory distress. She has no wheezes. She has no rales.  Abdominal: Soft. She exhibits  no distension. There is tenderness. There is no rebound and no guarding.  Pos murphy's sign  Musculoskeletal: Normal range of motion. She exhibits no edema.  Lymphadenopathy:    She has no cervical adenopathy.  Neurological: She is alert and oriented to person, place, and time.  Skin: Skin is warm and dry. She is not diaphoretic. No erythema.  Psychiatric: Mood and affect normal.  Vitals reviewed.       Results for orders placed or performed during the hospital encounter of 11/10/15 (from the past 48 hour(s))  Basic metabolic panel     Status: Abnormal   Collection Time: 11/10/15  8:01 AM  Result Value Ref Range   Sodium 132 (L) 135 - 145 mmol/L    Potassium 3.6 3.5 - 5.1 mmol/L   Chloride 102 101 - 111 mmol/L   CO2 24 22 - 32 mmol/L   Glucose, Bld 76 65 - 99 mg/dL   BUN 13 6 - 20 mg/dL   Creatinine, Ser 0.71 0.44 - 1.00 mg/dL   Calcium 8.6 (L) 8.9 - 10.3 mg/dL   GFR calc non Af Amer >60 >60 mL/min   GFR calc Af Amer >60 >60 mL/min    Comment: (NOTE) The eGFR has been calculated using the CKD EPI equation. This calculation has not been validated in all clinical situations. eGFR's persistently <60 mL/min signify possible Chronic Kidney Disease.    Anion gap 6 5 - 15  CBC     Status: None   Collection Time: 11/10/15  8:01 AM  Result Value Ref Range   WBC 7.5 3.6 - 11.0 K/uL   RBC 4.34 3.80 - 5.20 MIL/uL   Hemoglobin 13.5 12.0 - 16.0 g/dL   HCT 39.1 35.0 - 47.0 %   MCV 90.1 80.0 - 100.0 fL   MCH 31.1 26.0 - 34.0 pg   MCHC 34.5 32.0 - 36.0 g/dL   RDW 13.0 11.5 - 14.5 %   Platelets 380 150 - 440 K/uL  Troponin I     Status: None   Collection Time: 11/10/15  8:01 AM  Result Value Ref Range   Troponin I <0.03 <0.031 ng/mL    Comment:        NO INDICATION OF MYOCARDIAL INJURY.   Lipase, blood     Status: None   Collection Time: 11/10/15  8:01 AM  Result Value Ref Range   Lipase 29 11 - 51 U/L  Hepatic function panel     Status: None   Collection Time: 11/10/15  8:01 AM  Result Value Ref Range   Total Protein 6.8 6.5 - 8.1 g/dL   Albumin 4.1 3.5 - 5.0 g/dL   AST 30 15 - 41 U/L   ALT 17 14 - 54 U/L   Alkaline Phosphatase 76 38 - 126 U/L   Total Bilirubin 0.4 0.3 - 1.2 mg/dL   Bilirubin, Direct 0.1 0.1 - 0.5 mg/dL   Indirect Bilirubin 0.3 0.3 - 0.9 mg/dL   US Abdomen Limited Ruq  11/10/2015  CLINICAL DATA:  Epigastric abdominal pain for several weeks. EXAM: US ABDOMEN LIMITED - RIGHT UPPER QUADRANT COMPARISON:  None. FINDINGS: Gallbladder: Gallbladder is distended with probable multiple small calculi present. Mild gallbladder wall thickening is noted measured at 3.8 mm. Mild pericholecystic fluid is noted. No  sonographic Murphy's sign is noted. Common bile duct: Diameter: Dilated at 10.2 cm with possible intrahepatic biliary dilatation present concerning for distal common bile duct obstruction. Liver: No focal lesion identified. Within normal limits in  parenchymal echogenicity. IMPRESSION: Minimal cholelithiasis is noted with gallbladder distention and mild gallbladder wall thickening and pericholecystic fluid. These findings are concerning for acute cholecystitis. Also noted is significant common bile duct dilatation with possible intrahepatic biliary dilatation concerning for distal common bile duct obstruction. MRCP is recommended for further evaluation. Electronically Signed   By: Marijo Conception, M.D.   On: 11/10/2015 09:27     Assessment/Plan  This a patient with acute cholecystitis on ultrasound which is been personally reviewed she has normal liver function tests. I recommended admission to the hospital control her pain and nausea and proceed to the operating room for control of her symptoms of acute cholecystitis. I described for she and her husband the options of observation and the rationale for offering surgery also described the procedure itself and the risk of bleeding infection recurrence of symptoms failure to resolve all of her symptoms and conversion to an open procedure with the risk of bleeding infection and bowel or bile duct injury or leak this is reviewed for them they understood and agreed to proceed  Florene Glen, MD, FACS

## 2015-11-10 NOTE — Discharge Instructions (Signed)
Remove dressing in 24 hours. °May shower in 24 hours. °Leave paper strips in place. °Resume all home medications. °Follow-up with Dr. Kista Robb in 10 days. °

## 2015-11-10 NOTE — Progress Notes (Signed)
Preoperative Review   Patient is met in the preoperative holding area. The history is reviewed in the chart and with the patient. I personally reviewed the options and rationale as well as the risks of this procedure that have been previously discussed with the patient. All questions asked by the patient and/or family were answered to their satisfaction.  Patient agrees to proceed with this procedure at this time.  Richard E Cooper M.D. FACS  

## 2015-11-10 NOTE — ED Notes (Signed)
Pt arrives with complaining of mid epigastric chest pain shooting to her back, states nausea, pt crying upon arrival, states she was seen at [facility] recently for same symptoms and ruled out for cardiac and PE, pt states she is supposed to have tests done to rule out her gallbladder, states her PCP wanted to check out her stomach before a heart cath, denies any smoking

## 2015-11-10 NOTE — Anesthesia Postprocedure Evaluation (Signed)
Anesthesia Post Note  Patient: Raylie Bertini  Procedure(s) Performed: Procedure(s) (LRB): LAPAROSCOPIC CHOLECYSTECTOMY (N/A)  Patient location during evaluation: PACU Anesthesia Type: General Level of consciousness: awake and alert Pain management: pain level controlled Vital Signs Assessment: post-procedure vital signs reviewed and stable Respiratory status: spontaneous breathing, nonlabored ventilation, respiratory function stable and patient connected to nasal cannula oxygen Cardiovascular status: blood pressure returned to baseline and stable Postop Assessment: no signs of nausea or vomiting Anesthetic complications: no    Last Vitals:  Filed Vitals:   11/10/15 1801 11/10/15 1905  BP: 119/66 94/64  Pulse: 63 86  Temp: 36.4 C 36.6 C  Resp: 16 17    Last Pain:  Filed Vitals:   11/10/15 1905  PainSc: 1                  Precious Haws Piscitello

## 2015-11-10 NOTE — Transfer of Care (Signed)
Immediate Anesthesia Transfer of Care Note  Patient: Hannah [name]  Procedure(s) Performed: Procedure(s): LAPAROSCOPIC CHOLECYSTECTOMY (N/A)  Patient Location: PACU  Anesthesia Type:General  Level of Consciousness: awake, alert  and oriented  Airway & Oxygen Therapy: Patient Spontanous Breathing  Post-op Assessment: Report given to RN and Post -op Vital signs reviewed and stable  Post vital signs: Reviewed and stable  Last Vitals:  Filed Vitals:   11/10/15 1505 11/10/15 1620  BP: 135/82 153/93  Pulse:  85  Temp:  36 C  Resp:  22    Complications: No apparent anesthesia complications

## 2015-11-10 NOTE — Anesthesia Procedure Notes (Signed)
Procedure Name: Intubation Date/Time: 11/10/2015 3:33 PM Performed by: Jonna Clark Pre-anesthesia Checklist: Patient identified, Patient being monitored, Timeout performed, Emergency Drugs available and Suction available Patient Re-evaluated:Patient Re-evaluated prior to inductionOxygen Delivery Method: Circle system utilized Preoxygenation: Pre-oxygenation with 100% oxygen Intubation Type: IV induction Ventilation: Mask ventilation without difficulty Laryngoscope Size: Mac and 3 Grade View: Grade I Tube type: Oral Tube size: 7.0 mm Number of attempts: 1 Airway Equipment and Method: Stylet Placement Confirmation: ETT inserted through vocal cords under direct vision,  positive ETCO2 and breath sounds checked- equal and bilateral Secured at: 21 cm Tube secured with: Tape Dental Injury: Teeth and Oropharynx as per pre-operative assessment

## 2015-11-10 NOTE — Progress Notes (Signed)
Pt arrived to unit from PACU. VSS. Pt was alert and oriented to safety plan and room.

## 2015-11-10 NOTE — ED Provider Notes (Addendum)
[facility] Emergency Department Provider Note  ____________________________________________   I have reviewed the triage vital signs and the nursing notes.   HISTORY  Chief Complaint Chest Pain    HPI Hannah [name] is a 55 y.o. female states that she has been having epigastric and to some extent right upper quadrant pain off and on for the 6 weeks. She states she has nausea but no vomiting. Pain radiates to the back. She has no pleuritic chest pain or actual chest pain. His epigastric pain. She is on Nexium, she is doubled her Nexium with no relief of symptoms. The pain comes and goes. Sometimes is worse when she lies flat or when she stands up straight. Nothing else makes it better or worse that she can think of although there are some foods that she avoids. She states that she has had a negative endoscopy with H. pylori testing pending. She went to [facility] and had a cardiac rule out and what she describes a negative PE assessment. Patient has no personal or family history of PE or DVT no recent travel no leg swelling, she does not have any pleuritic pain. This is episodic abdominal pain that goes towards her back. She does have a hiatal hernia. She has had pain without before but this feels somewhat different. She is here because there is a ultrasound pending as an outpatient, and she does not think she can wait. Patient has had significant discomfort off and on for the last 6 weeks. Sometimes it lasts for an hour sometimes longer sometimes less long. It is a sharp pain. She cannot tell me much about the timing of the pain. Seems to be sometimes related to food but not always. This morning it happened about driving into work. It is not exertional. She has nausea but has not had any vomiting nor she had any diarrhea. No melena bright red blood per rectum.     Past Medical History  Diagnosis Date  . Anxiety   . Herpes labialis   . Environmental allergies   . Urinary  incontinence   . Urticaria   . Asthma   . Hiatal hernia     Patient Active Problem List   Diagnosis Date Noted  . Chronic idiopathic urticaria 07/12/2015  . Mild intermittent asthma 07/12/2015  . Mild persistent asthma 05/17/2015  . Other allergic rhinitis 05/17/2015  . Idiopathic urticaria 05/17/2015  . GERD (gastroesophageal reflux disease) 05/17/2015    Past Surgical History  Procedure Laterality Date  . Abdominal surgery  2001    MYOMECTOMY   . Hysteroscopy      D&C, HYST X 3  . Bladder suspension  2003    Mentor transobturator pubovaginal sling Dr Gaynelle Arabian    Current Outpatient Rx  Name  Route  Sig  Dispense  Refill  . albuterol (PROVENTIL HFA;VENTOLIN HFA) 108 (90 BASE) MCG/ACT inhaler   Inhalation   Inhale 2 puffs into the lungs every 4 (four) hours as needed for wheezing or shortness of breath.   1 Inhaler   1   . ALPRAZolam (XANAX) 1 MG tablet   Oral   Take 1 mg by mouth at bedtime as needed for anxiety.         . beclomethasone (QVAR) 80 MCG/ACT inhaler   Inhalation   Inhale 1 puff into the lungs 2 (two) times daily. Rinse gargle and spit out after use   1 Inhaler   0   . Calcium Carbonate-Vit D-Min (CALCIUM 1200  PO)   Oral   Take by mouth as needed.         . cetirizine (ZYRTEC) 10 MG tablet   Oral   Take 10 mg by mouth daily.          . Cholecalciferol (VITAMIN D PO)   Oral   Take 2,000 Units by mouth.          . diphenhydrAMINE (BENADRYL) 50 MG capsule   Oral   Take 50 mg by mouth as needed.           Marland Kitchen EPINEPHrine (EPI-PEN) 0.3 mg/0.3 mL DEVI      0.3 mg once.           Marland Kitchen esomeprazole (NEXIUM) 40 MG capsule   Oral   Take 40 mg by mouth daily before breakfast.           . estradiol (VIVELLE-DOT) 0.1 MG/24HR patch   Transdermal   Place 1 patch (0.1 mg total) onto the skin 2 (two) times a week.   8 patch   12   . Fexofenadine HCl (ALLEGRA PO)   Oral   Take 180 mg by mouth daily.          . Mirabegron (MYRBETRIQ  PO)   Oral   Take by mouth. Reported on 08/04/2015         . Multiple Vitamin (MULTIVITAMIN) tablet   Oral   Take 1 tablet by mouth daily.         . nitroGLYCERIN (NITROSTAT) 0.3 MG SL tablet   Sublingual   Place 0.3 mg under the tongue as needed. Reported on 08/04/2015         . Olopatadine HCl (PATADAY OP)   Ophthalmic   Apply to eye.           . progesterone (PROMETRIUM) 100 MG capsule   Oral   Take 1 capsule (100 mg total) by mouth at bedtime.   30 capsule   12   . ranitidine (ZANTAC) 75 MG tablet   Oral   Take 150 mg by mouth 2 (two) times daily.          . valACYclovir (VALTREX) 500 MG tablet   Oral   Take 500 mg by mouth as needed.             Allergies Latex; Codeine; and Montelukast  Family History  Problem Relation Age of Onset  . Cancer Maternal Uncle     2 MATERNAL UNCLES W COLON CA  . Heart disease Maternal Grandmother   . Hypertension Maternal Grandmother   . Cancer Maternal Grandfather     Lung  . Cancer Paternal Grandfather     Lung  . Myasthenia gravis Father     Social History Social History  Substance Use Topics  . Smoking status: Former Smoker    Types: Cigarettes    Quit date: 08/13/2013  . Smokeless tobacco: Never Used  . Alcohol Use: 1.8 oz/week    3 Standard drinks or equivalent per week     Comment: Wine    Review of Systems Constitutional: No fever/chills Eyes: No visual changes. ENT: No sore throat. No stiff neck no neck pain Cardiovascular: Denies chest pain. Respiratory: Denies shortness of breath. Gastrointestinal:   no vomiting.  No diarrhea.  No constipation. Genitourinary: Negative for dysuria. Musculoskeletal: Negative lower extremity swelling Skin: Negative for rash. Neurological: Negative for headaches, focal weakness or numbness. 10-point ROS otherwise negative.  ____________________________________________   PHYSICAL EXAM:  VITAL SIGNS: ED Triage Vitals  Enc Vitals Group     BP 11/10/15  0757 154/100 mmHg     Pulse Rate 11/10/15 0757 76     Resp 11/10/15 0757 18     Temp 11/10/15 0757 98.1 F (36.7 C)     Temp Source 11/10/15 0757 Oral     SpO2 11/10/15 0757 100 %     Weight 11/10/15 0757 150 lb (68.04 kg)     Height 11/10/15 0757 5\' 4"  (1.626 m)     Head Cir --      Peak Flow --      Pain Score 11/10/15 0758 6     Pain Loc --      Pain Edu? --      Excl. in [place]? --     Constitutional: Alert and oriented. Well appearing and in no acute Medical distress however, patient is anxious and upset and tearful, holding her stomach Eyes: Conjunctivae are normal. PERRL. EOMI. Head: Atraumatic. Nose: No congestion/rhinnorhea. Mouth/Throat: Mucous membranes are moist.  Oropharynx non-erythematous. Neck: No stridor.   Nontender with no meningismus Cardiovascular: Normal rate, regular rhythm. Grossly normal heart sounds.  Good peripheral circulation. Respiratory: Normal respiratory effort.  No retractions. Lungs CTAB. Abdominal: Soft with some minimal right upper quadrant discomfort as reproduces her pain. No distention. No guarding no rebound nonsurgical abdomen Back:  There is no focal tenderness or step off there is no midline tenderness there are no lesions noted. there is no CVA tenderness Musculoskeletal: No lower extremity tenderness. No joint effusions, no DVT signs strong distal pulses no edema Neurologic:  Normal speech and language. No gross focal neurologic deficits are appreciated.  Skin:  Skin is warm, dry and intact. No rash noted. Psychiatric: Mood and affect are normal. Speech and behavior are normal.  ____________________________________________   LABS (all labs ordered are listed, but only abnormal results are displayed)  Labs Reviewed  BASIC METABOLIC PANEL - Abnormal; Notable for the following:    Sodium 132 (*)    Calcium 8.6 (*)    All other components within normal limits  TROPONIN I  LIPASE, BLOOD  CBC  HEPATIC FUNCTION PANEL    ____________________________________________  EKG  I personally interpreted any EKGs ordered by me or triage Ordered by triage EKG shows normal sinus rhythm rate 71 bpm no acute ST elevation or acute ST depression normal axis unremarkable EKG ____________________________________________  RADIOLOGY  I reviewed any imaging ordered by me or triage that were performed during my shift and, if possible, patient and/or family made aware of any abnormal findings. ____________________________________________   PROCEDURES  Procedure(s) performed: None  Critical Care performed: None  ____________________________________________   INITIAL IMPRESSION / ASSESSMENT AND PLAN / ED COURSE  Pertinent labs & imaging results that were available during my care of the patient were reviewed by me and considered in my medical decision making (see chart for details).  Patient with reproducible abdominal pain off and on for 6 weeks on a nearly daily basis. We do note patient is on HRT but I do not believe this is clinically consistent with PE, she has no chest pain, no shortness of breath, no dyspnea, no leg swelling or cramping, and the patient has no pleuritic pain in particular. This is reproducible abdominal pain in the context of a hiatal hernia. Pain is been going off-and-on as moderately for the last 6 weeks. She is here for an ultrasound for further evaluation. This is not in any event also  consistent with ACS. Very atypical epigastric discomfort nonetheless we did do an EKG which is as expected is normal and we will send a troponin however again low suspicion and patient has had outpatient cardiac workup for this as well. We will treat her pain with a GI cocktail as well as pain medication, will obtain an ultrasound blood work and reassess. ____________________________________________   FINAL CLINICAL IMPRESSION(S) / ED DIAGNOSES  Final diagnoses:  Abdominal pain      This chart was  dictated using voice recognition software.  Despite best efforts to proofread,  errors can occur which can change meaning.     Schuyler Amor, MD 11/10/15 [id number]  Schuyler Amor, MD 11/10/15 [phone]

## 2015-11-10 NOTE — ED Notes (Signed)
Attempted to have patient sign consent for surgery.  Patient does not recall surgeon going over surgery details with her.  OR consent paper sent with OR RN and informed him  It was unsigned and needed to be reviewed with the surgeon prior to surgery.

## 2015-11-10 NOTE — Progress Notes (Signed)
Phenergan given for nausea  And gas discomfort

## 2015-11-10 NOTE — Anesthesia Preprocedure Evaluation (Signed)
Anesthesia Evaluation  Patient identified by MRN, date of birth, ID band Patient awake    Reviewed: Allergy & Precautions, H&P , NPO status , Patient's Chart, lab work & pertinent test results  History of Anesthesia Complications (+) PONV and history of anesthetic complications  Airway Mallampati: II  TM Distance: >3 FB Neck ROM: full    Dental  (+) Poor Dentition   Pulmonary neg shortness of breath, asthma , former smoker,    Pulmonary exam normal breath sounds clear to auscultation       Cardiovascular Exercise Tolerance: Good (-) angina(-) Past MI and (-) DOE negative cardio ROS Normal cardiovascular exam Rhythm:regular Rate:Normal     Neuro/Psych PSYCHIATRIC DISORDERS Anxiety  Neuromuscular disease    GI/Hepatic Neg liver ROS, hiatal hernia, GERD  Controlled and Medicated,  Endo/Other  negative endocrine ROS  Renal/GU negative Renal ROS  negative genitourinary   Musculoskeletal   Abdominal   Peds  Hematology negative hematology ROS (+)   Anesthesia Other Findings Past Medical History:   Anxiety                                                      Herpes labialis                                              Environmental allergies                                      Urinary incontinence                                         Urticaria                                                    Asthma                                                       Hiatal hernia                                               Past Surgical History:   ABDOMINAL SURGERY                                2001           Comment:MYOMECTOMY    HYSTEROSCOPY  Comment:D&C, HYST X 3   BLADDER SUSPENSION                               2003           Comment:Mentor transobturator pubovaginal sling Dr               Gaynelle Arabian  BMI    Body Mass Index   25.73 kg/m 2      Reproductive/Obstetrics negative OB ROS                             Anesthesia Physical Anesthesia Plan  ASA: III  Anesthesia Plan: General ETT and Rapid Sequence   Post-op Pain Management:    Induction:   Airway Management Planned:   Additional Equipment:   Intra-op Plan:   Post-operative Plan:   Informed Consent: I have reviewed the patients History and Physical, chart, labs and discussed the procedure including the risks, benefits and alternatives for the proposed anesthesia with the patient or authorized representative who has indicated his/her understanding and acceptance.   Dental Advisory Given  Plan Discussed with: Anesthesiologist, CRNA and Surgeon  Anesthesia Plan Comments:         Anesthesia Quick Evaluation

## 2015-11-10 NOTE — Op Note (Signed)
Laparoscopic Cholecystectomy  Pre-operative Diagnosis: Acute cholecystitis  Post-operative Diagnosis: Acute cholecystitis with edema  Procedure: Laparoscopic cholecystectomy  Surgeon: Jerrol Banana. Burt Knack, MD FACS  Anesthesia: Gen. with endotracheal tube  Assistant: Surgical tech  Procedure Details  The patient was seen again in the Holding Room. The benefits, complications, treatment options, and expected outcomes were discussed with the patient. The risks of bleeding, infection, recurrence of symptoms, failure to resolve symptoms, bile duct damage, bile duct leak, retained common bile duct stone, bowel injury, any of which could require further surgery and/or ERCP, stent, or papillotomy were reviewed with the patient. The likelihood of improving the patient's symptoms with return to their baseline status is good.  The patient and/or family concurred with the proposed plan, giving informed consent.  The patient was taken to Operating Room, identified as Hannah [name] and the procedure verified as Laparoscopic Cholecystectomy.  A Time Out was held and the above information confirmed.  Prior to the induction of general anesthesia, antibiotic prophylaxis was administered. VTE prophylaxis was in place. General endotracheal anesthesia was then administered and tolerated well. After the induction, the abdomen was prepped with Chloraprep and draped in the sterile fashion. The patient was positioned in the supine position.  Local anesthetic  was injected into the skin near the umbilicus and an incision made. The Veress needle was placed. Pneumoperitoneum was then created with CO2 and tolerated well without any adverse changes in the patient's vital signs. A 26mm port was placed in the periumbilical position and the abdominal cavity was explored.  Two 5-mm ports were placed in the right upper quadrant and a 12 mm epigastric port was placed all under direct vision. All skin incisions  were infiltrated  with a local anesthetic agent before making the incision and placing the trocars.   The patient was positioned  in reverse Trendelenburg, tilted slightly to the patient's left.  The gallbladder was identified, the fundus grasped and retracted cephalad. Adhesions were lysed bluntly. The infundibulum was grasped and retracted laterally, exposing the peritoneum overlying the triangle of Calot. This was then divided and exposed in a blunt fashion. A critical view of the cystic duct and cystic artery was obtained.  The cystic duct was clearly identified and bluntly dissected.   The cystic duct was doubly clipped and divided and the cystic artery was doubly clipped and divided and multiple branches.  The gallbladder was taken from the gallbladder fossa in a retrograde fashion with the electrocautery. The gallbladder was removed and placed in an Endocatch bag. The liver bed was irrigated and inspected. Hemostasis was achieved with the electrocautery. Copious irrigation was utilized and was repeatedly aspirated until clear.  The gallbladder and Endocatch sac were then removed through the epigastric port site.    Inspection of the right upper quadrant was performed. No bleeding, bile duct injury or leak, or bowel injury was noted. Pneumoperitoneum was released.  The epigastric port site was closed with figure-of-eight 0 Vicryl sutures. 4-0 subcuticular Monocryl was used to close the skin. Steristrips and Mastisol and sterile dressings were  applied.  The patient was then extubated and brought to the recovery room in stable condition. Sponge, lap, and needle counts were correct at closure and at the conclusion of the case.   Findings: Acute edematous Cholecystitis   Estimated Blood Loss: Minimal         Drains: None         Specimens: Gallbladder  Complications: none               Martell Mcfadyen E. Burt Knack, MD, FACS

## 2015-11-11 ENCOUNTER — Encounter: Payer: Self-pay | Admitting: Surgery

## 2015-11-11 LAB — COMPREHENSIVE METABOLIC PANEL
ALBUMIN: 3.4 g/dL — AB (ref 3.5–5.0)
ALK PHOS: 133 U/L — AB (ref 38–126)
ALT: 164 U/L — ABNORMAL HIGH (ref 14–54)
ANION GAP: 5 (ref 5–15)
AST: 204 U/L — ABNORMAL HIGH (ref 15–41)
BILIRUBIN TOTAL: 0.8 mg/dL (ref 0.3–1.2)
BUN: 11 mg/dL (ref 6–20)
CALCIUM: 8.6 mg/dL — AB (ref 8.9–10.3)
CO2: 23 mmol/L (ref 22–32)
Chloride: 105 mmol/L (ref 101–111)
Creatinine, Ser: 0.64 mg/dL (ref 0.44–1.00)
GFR calc Af Amer: >60 mL/min (ref >60)
GLUCOSE: 136 mg/dL — AB (ref 65–99)
Potassium: 4.3 mmol/L (ref 3.5–5.1)
Sodium: 133 mmol/L — ABNORMAL LOW (ref 135–145)
TOTAL PROTEIN: 6.2 g/dL — AB (ref 6.5–8.1)

## 2015-11-11 LAB — CBC
HCT: 34.9 % — ABNORMAL LOW (ref 35.0–47.0)
HEMOGLOBIN: 11.8 g/dL — AB (ref 12.0–16.0)
MCH: 30.6 pg (ref 26.0–34.0)
MCHC: 33.9 g/dL (ref 32.0–36.0)
MCV: 90.4 fL (ref 80.0–100.0)
Platelets: 344 10*3/uL (ref 150–440)
RBC: 3.86 MIL/uL (ref 3.80–5.20)
RDW: 13.3 % (ref 11.5–14.5)
WBC: 11 10*3/uL (ref 3.6–11.0)

## 2015-11-11 MED ORDER — PHENOL 1.4 % MT LIQD
1.0000 | OROMUCOSAL | Status: DC | PRN
  Filled 2015-11-11: qty 177

## 2015-11-11 MED FILL — Promethazine HCl Inj 25 MG/ML: INTRAMUSCULAR | Qty: 1 | Status: AC

## 2015-11-11 NOTE — Discharge Summary (Signed)
Physician Discharge Summary  Patient ID: Hannah [name] MRN: [id number] DOB/AGE: 1960-10-19 55 y.o.  Admit date: 11/10/2015 Discharge date: 11/11/2015   Discharge Diagnoses:  Active Problems:   Acute cholecystitis   Procedures:Laparoscopic cholecystectomy  Hospital Course: This a patient admitted the hospital was clear signs of acute cholecystitis who was taken the operating room for laparoscopic cholecystectomy were edematous cholecystitis was identified. A small cystic duct was noted her preoperative LFTs were normal and postoperatively they showed some slight elevation but there were no other signs of choledocholithiasis. She is being discharged on a regular diet to follow-up in our office in 10 days at which time if necessary her LFTs can be repeated she is given instructions concerning dressing changes and removal and follow-up in our office.  Consults: None  Disposition:      Medication List    TAKE these medications        albuterol 108 (90 Base) MCG/ACT inhaler  Commonly known as:  PROVENTIL HFA;VENTOLIN HFA  Inhale 2 puffs into the lungs every 4 (four) hours as needed for wheezing or shortness of breath.     ALPRAZolam 1 MG tablet  Commonly known as:  XANAX  Take 1 mg by mouth 3 (three) times daily as needed for anxiety.     beclomethasone 80 MCG/ACT inhaler  Commonly known as:  QVAR  Inhale 1 puff into the lungs 2 (two) times daily. Rinse gargle and spit out after use     CALCIUM 600 1500 (600 Ca) MG Tabs tablet  Generic drug:  calcium carbonate  Take 600 mg of elemental calcium by mouth daily.     cetirizine 10 MG tablet  Commonly known as:  ZYRTEC  Take 10 mg by mouth daily.     cholecalciferol 1000 units tablet  Commonly known as:  VITAMIN D  Take 2,000 Units by mouth daily.     diphenhydrAMINE 50 MG capsule  Commonly known as:  BENADRYL  Take 50 mg by mouth as needed for allergies.     EPINEPHrine 0.3 mg/0.3 mL Devi  Commonly known as:  EPI-PEN   Inject 0.3 mg into the muscle as needed (FOR ALLERGIC REACTION).     esomeprazole 40 MG capsule  Commonly known as:  NEXIUM  Take 40 mg by mouth daily before breakfast.     estradiol 0.1 MG/24HR patch  Commonly known as:  VIVELLE-DOT  Place 1 patch (0.1 mg total) onto the skin 2 (two) times a week.     fexofenadine 180 MG tablet  Commonly known as:  ALLEGRA  Take 180 mg by mouth daily.     multivitamin with minerals Tabs tablet  Take 1 tablet by mouth daily.     MYRBETRIQ 25 MG Tb24 tablet  Generic drug:  mirabegron ER  Take 1 tablet by mouth daily.     NASONEX 50 MCG/ACT nasal spray  Generic drug:  mometasone  Place 2 sprays into both nostrils daily.     nitroGLYCERIN 0.3 MG SL tablet  Commonly known as:  NITROSTAT  Place 0.3 mg under the tongue as needed. Reported on 08/04/2015     oxyCODONE 5 MG immediate release tablet  Commonly known as:  Oxy IR/ROXICODONE  Take 1 tablet (5 mg total) by mouth once as needed (for pain score of 1-4).     PATADAY OP  Apply 1 drop to eye 3 (three) times daily as needed.     phentermine 15 MG capsule  Take 15 mg by mouth daily.  phentermine 30 MG capsule  Take 30 mg by mouth every morning.     predniSONE 10 MG tablet  Commonly known as:  DELTASONE  Take 1 tablet by mouth as needed. FOR ALLERGIC REACTION     progesterone 100 MG capsule  Commonly known as:  PROMETRIUM  Take 1 capsule (100 mg total) by mouth at bedtime.     ranitidine 150 MG tablet  Commonly known as:  ZANTAC  Take 150 mg by mouth 2 (two) times daily.     valACYclovir 500 MG tablet  Commonly known as:  VALTREX  Take 500 mg by mouth as needed.           Follow-up Information    Follow up with Phoebe Perch, MD In 10 days.   Specialty:  Surgery   Why:  For wound re-check   Contact information:   [place] [id number] [phone]       Florene Glen, MD, FACS

## 2015-11-11 NOTE — Progress Notes (Signed)
1 Day Post-Op  Subjective: She feels much better this morning following laparoscopic cholecystectomy yesterday she has no nausea vomiting minimal pain  Objective: Vital signs in last 24 hours: Temp:  [96.8 F (36 C)-98.7 F (37.1 C)] 97.8 F (36.6 C) (03/30 0519) Pulse Rate:  [51-102] 68 (03/30 0519) Resp:  [12-28] 18 (03/30 0519) BP: (94-167)/(62-94) 103/62 mmHg (03/30 0519) SpO2:  [91 %-100 %] 100 % (03/30 0519) Weight:  [150 lb (68.04 kg)-166 lb 8 oz (75.524 kg)] 166 lb 8 oz (75.524 kg) (03/29 1808) Last BM Date: 11/10/15  Intake/Output from previous day: 03/29 0701 - 03/30 0700 In: 600 [I.V.:600] Out: 1205 [Urine:1200; Blood:5] Intake/Output this shift: Total I/O In: 30 [P.O.:30] Out: -   Physical exam:  Abdomen is soft nondistended nontympanitic and nontender wounds are dressed and clean. He has a nontender no icterus no jaundice  Lab Results: CBC   Recent Labs  11/10/15 0801 11/11/15 0545  WBC 7.5 11.0  HGB 13.5 11.8*  HCT 39.1 34.9*  PLT 380 344   BMET  Recent Labs  11/10/15 0801 11/11/15 0545  NA 132* 133*  K 3.6 4.3  CL 102 105  CO2 24 23  GLUCOSE 76 136*  BUN 13 11  CREATININE 0.71 0.64  CALCIUM 8.6* 8.6*   PT/INR No results for input(s): LABPROT, INR in the last 72 hours. ABG No results for input(s): PHART, HCO3 in the last 72 hours.  Invalid input(s): PCO2, PO2  Studies/Results: US Abdomen Limited Ruq  11/10/2015  CLINICAL DATA:  Epigastric abdominal pain for several weeks. EXAM: US ABDOMEN LIMITED - RIGHT UPPER QUADRANT COMPARISON:  None. FINDINGS: Gallbladder: Gallbladder is distended with probable multiple small calculi present. Mild gallbladder wall thickening is noted measured at 3.8 mm. Mild pericholecystic fluid is noted. No sonographic Murphy's sign is noted. Common bile duct: Diameter: Dilated at 10.2 cm with possible intrahepatic biliary dilatation present concerning for distal common bile duct obstruction. Liver: No focal lesion  identified. Within normal limits in parenchymal echogenicity. IMPRESSION: Minimal cholelithiasis is noted with gallbladder distention and mild gallbladder wall thickening and pericholecystic fluid. These findings are concerning for acute cholecystitis. Also noted is significant common bile duct dilatation with possible intrahepatic biliary dilatation concerning for distal common bile duct obstruction. MRCP is recommended for further evaluation. Electronically Signed   By: Marijo Conception, M.D.   On: 11/10/2015 09:27    Anti-infectives: Anti-infectives    Start     Dose/Rate Route Frequency Ordered Stop   11/10/15 1526  ceFAZolin (ANCEF) 2-4 GM/100ML-% IVPB    Comments:  KENNEDY, DENISE: cabinet override      11/10/15 1526 11/11/15 0329   11/10/15 1434  ceFAZolin (ANCEF) IVPB 2g/100 mL premix     2 g 200 mL/hr over 30 Minutes Intravenous 30 min pre-op 11/10/15 1434 11/10/15 1551      Assessment/Plan: s/p Procedure(s): LAPAROSCOPIC CHOLECYSTECTOMY   Of interest is the patient's LFTs which were normal preoperatively were elevated slightly postoperatively today. She shows no signs of a retained stone however other than these labs and her cystic duct was quite small at surgery. I will discharge her today with oral analgesics to follow up in our office next week where LFTs can be repeated if necessary.  Florene Glen, MD, FACS  11/11/2015

## 2015-11-11 NOTE — Progress Notes (Signed)
Patient discharged home with family.  All discharge instructions reviewed and discharge paperwork given to patient.  Patient verbalized understanding.  IV removed in tact. Prescription given to patient with all questions and concerns addressed. Patient family at bedside for transfer home.  Haynes Hoehn RN.

## 2015-11-12 ENCOUNTER — Telehealth: Payer: Self-pay | Admitting: Surgery

## 2015-11-12 NOTE — Telephone Encounter (Signed)
Patient had gallbladder surgery and different get a lot of directions regarding driving and lifting.

## 2015-11-12 NOTE — Telephone Encounter (Signed)
Returned phone call to patient. All post-op information was reviewed and questions were answered. Information also sent through mychart at this time. Encouraged patient to call back if she needs a return to work note or has any other questions or concerns prior to her scheduled post-op appointment.

## 2015-11-15 LAB — SURGICAL PATHOLOGY

## 2015-11-22 ENCOUNTER — Encounter: Payer: Self-pay | Admitting: Surgery

## 2015-11-22 ENCOUNTER — Ambulatory Visit (INDEPENDENT_AMBULATORY_CARE_PROVIDER_SITE_OTHER): Payer: Managed Care, Other (non HMO) | Admitting: Surgery

## 2015-11-22 VITALS — BP 139/83 | HR 84 | Temp 98.3°F | Ht 64.0 in | Wt 153.0 lb

## 2015-11-22 DIAGNOSIS — K8000 Calculus of gallbladder with acute cholecystitis without obstruction: Secondary | ICD-10-CM

## 2015-11-22 NOTE — Progress Notes (Signed)
Status post laparoscopic cholecystectomy for acute cholecystitis. She states feeling much better now her chest pain is gone and she believes that she's been having symptoms for much longer than what she believed initially. She feels much better and is eating well but has had urgency and loose stools over the last few days. She denies fevers or chills and no jaundice or acholic stools.  No icterus no jaundice soft nontender abdomen wounds healing well no erythema no drainage  Pathology is reviewed  Patient doing very well I suspect her urgency and loose stool are related to post cholecystectomy syndrome which should resolve spontaneously however I would like to check a C. difficile toxin on her at this point PCR. We'll follow up on an as-needed basis O I will notify her the results PCR

## 2015-11-22 NOTE — Addendum Note (Signed)
Addended by: Wayna Chalet on: 11/22/2015 11:51 AM   Modules accepted: Orders

## 2015-11-22 NOTE — Patient Instructions (Signed)
Please give us a call if you have any questions or concerns. 

## 2015-11-29 ENCOUNTER — Telehealth: Payer: Self-pay

## 2015-11-29 DIAGNOSIS — R197 Diarrhea, unspecified: Secondary | ICD-10-CM

## 2015-11-29 NOTE — Telephone Encounter (Signed)
Called patient to ask how she was feeling and she stated that she was feeling better. Therefore, she had not turned in her stool sample. However, she stated that if she started to have diarrhea again, then she would turn in her stool sample.

## 2015-11-29 NOTE — Telephone Encounter (Signed)
Patient's short term disability from [place] was faxed. Patient was notified and requested for a copy to be mailed. I told her that I would once I received the fax confirmation. Patient agreed.

## 2016-01-17 ENCOUNTER — Encounter: Payer: Self-pay | Admitting: Pediatrics

## 2016-01-17 ENCOUNTER — Ambulatory Visit (INDEPENDENT_AMBULATORY_CARE_PROVIDER_SITE_OTHER): Payer: Managed Care, Other (non HMO) | Admitting: Pediatrics

## 2016-01-17 ENCOUNTER — Ambulatory Visit: Payer: BLUE CROSS/BLUE SHIELD | Admitting: Pediatrics

## 2016-01-17 VITALS — BP 118/72 | HR 76 | Temp 98.0°F | Resp 16 | Ht 64.02 in | Wt 157.8 lb

## 2016-01-17 DIAGNOSIS — L508 Other urticaria: Secondary | ICD-10-CM

## 2016-01-17 DIAGNOSIS — J4520 Mild intermittent asthma, uncomplicated: Secondary | ICD-10-CM | POA: Diagnosis not present

## 2016-01-17 MED ORDER — ALBUTEROL SULFATE HFA 108 (90 BASE) MCG/ACT IN AERS: 2.0000 | INHALATION_SPRAY | RESPIRATORY_TRACT | Status: DC | PRN

## 2016-01-17 MED ORDER — PREDNISONE 10 MG PO TABS: ORAL_TABLET | ORAL | Status: DC

## 2016-01-17 MED ORDER — OLOPATADINE HCL 0.2 % OP SOLN: OPHTHALMIC | Status: DC

## 2016-01-17 MED ORDER — MOMETASONE FUROATE 50 MCG/ACT NA SUSP: NASAL | Status: DC

## 2016-01-17 MED ORDER — EPINEPHRINE 0.3 MG/0.3ML IJ SOAJ: INTRAMUSCULAR | Status: DC

## 2016-01-17 NOTE — Progress Notes (Signed)
[place] [address] Dept: [phone]  FOLLOW UP NOTE  Patient ID: Hannah [name], female    DOB: 26-Apr-1961  Age: 55 y.o. MRN: [id number] Date of Office Visit: 01/17/2016  Assessment Chief Complaint: Urticaria  HPI [name] presents for follow-up of urticaria and asthma. She is not having asthmatic symptoms and very rarely needs Proventil. She does not use Qvar any longer Her urticaria is well controlled by using a Allegra 180 mg in the morning, Zyrtec 10 mg at night and ranitidine 150 mg twice a day. She has not had to use Benadryl and EpiPen since his her last visit in November 2016  Current medications Proventil 2 puffs every 4 hours if needed, Pataday 1 drop in each eye if needed. Her other medications are outlined in the chart   Drug Allergies:  Allergies  Allergen Reactions  . Latex Hives, Itching and Rash    With latex use  Rash, itching and hives no breathing issues  . Codeine Nausea And Vomiting    SENSITIVE  . Montelukast Other (See Comments)    nightmares    Physical Exam: BP 118/72 mmHg  Pulse 76  Temp(Src) 98 F (36.7 C) (Oral)  Resp 16  Ht 5' 4.02" (1.626 m)  Wt 157 lb 13.6 oz (71.6 kg)  BMI 27.08 kg/m2  SpO2 96%  LMP 03/14/2012   Physical Exam  Constitutional: She is oriented to person, place, and time. She appears well-developed and well-nourished.  HENT:  Eyes normal. Ears normal. Nose normal. Pharynx normal.  Neck: Neck supple. No thyromegaly present.  Cardiovascular:  S1 and S2 normal no murmurs  Pulmonary/Chest:  Clear to percussion auscultation  Lymphadenopathy:    She has no cervical adenopathy.  Neurological: She is alert and oriented to person, place, and time.  Skin:  Clear  Psychiatric: She has a normal mood and affect. Her behavior is normal. Judgment and thought content normal.  Vitals reviewed.   Diagnostics:  FVC 3.56 L FEV1 2.74 L. Predicted FVC 3.52 L predicted FEV1 2.78 L-the spirometry  is in the normal range  Assessment and Plan: 1. Chronic urticaria   2. Mild intermittent asthma, uncomplicated     Meds ordered this encounter  Medications  . predniSONE (DELTASONE) 10 MG tablet    Sig: ONE TABLET ONCE A DAY AS DIRECTED FOR ALLERGIC REACTION.    Dispense:  30 tablet    Refill:  0    KEEP RX ON FILE.  . mometasone (NASONEX) 50 MCG/ACT nasal spray    Sig: TWO SPRAYS EACH NOSTRIL ONCE A DAY FOR NASAL CONGESTION OR DRAINAGE.    Dispense:  17 g    Refill:  5    KEEP RX ON FILE.  Marland Kitchen Olopatadine HCl (PATADAY) 0.2 % SOLN    Sig: ONE DROP EACH EYE ONCE A DAY AS NEEDED FOR ITCHY EYES.    Dispense:  2.5 mL    Refill:  5    KEEP RX ON FILE.  Marland Kitchen EPINEPHrine (EPIPEN 2-PAK) 0.3 mg/0.3 mL IJ SOAJ injection    Sig: USE AS DIRECTED FOR SEVERE ALLERGIC REACTION.    Dispense:  2 Device    Refill:  1    KEEP RX ON FILE.  Marland Kitchen albuterol (VENTOLIN HFA) 108 (90 Base) MCG/ACT inhaler    Sig: Inhale 2 puffs into the lungs every 4 (four) hours as needed for wheezing or shortness of breath.    Dispense:  8 g    Refill:  1    KEEP RX ON FILE.    Patient Instructions  Continue on the present treatment plan. If she has an allergic reaction she will take Benadryl 50 mg every 4 hours and if she has life-threatening symptoms she will inject herself with EpiPen 0.3 mg She had a large swelling 24 hours after her flu vaccination last fall. She should have her flu vaccination in split doses if she is going to receive it Call me if you are not doing well on this treatment plan    Return in about 6 months (around 07/18/2016).    Thank you for the opportunity to care for this patient.  Please do not hesitate to contact me with questions.  Penne Lash, M.D.  [facility] [address] [place], [place] [address] [phone]

## 2016-01-17 NOTE — Patient Instructions (Signed)
Continue on the present treatment plan. If she has an allergic reaction she will take Benadryl 50 mg every 4 hours and if she has life-threatening symptoms she will inject herself with EpiPen 0.3 mg She had a large swelling 24 hours after her flu vaccination last fall. She should have her flu vaccination in split doses if she is going to receive it Call me if you are not doing well on this treatment plan

## 2016-01-18 ENCOUNTER — Telehealth: Payer: Self-pay | Admitting: Allergy

## 2016-01-18 ENCOUNTER — Other Ambulatory Visit: Payer: Self-pay | Admitting: Allergy

## 2016-01-18 MED ORDER — AZELASTINE HCL 0.05 % OP SOLN: 1.0000 [drp] | Freq: Two times a day (BID) | OPHTHALMIC | Status: DC

## 2016-01-18 NOTE — Telephone Encounter (Signed)
INFORMED PATIENT WE CHANGED TO AZELASTINE.

## 2016-01-24 ENCOUNTER — Ambulatory Visit: Payer: BLUE CROSS/BLUE SHIELD | Admitting: Allergy and Immunology

## 2016-03-30 ENCOUNTER — Telehealth: Payer: Self-pay | Admitting: *Deleted

## 2016-03-30 NOTE — Telephone Encounter (Signed)
Dr.Fontaine pt takes vivelle-dot patch 0.1 mg and progesterone 100 mg daily. Notes having spotting/bleeding since Monday. Pt states first time this has happened, LMP:2013. States on Monday it was brown blood very light, increased bleeding for 2 days (not heavy) just more than spotting. Now light bleeding today, did note cramping.  Asked if imaging should be order, had SHGM in past or watch for now related to progesterone? Please advise

## 2016-03-30 NOTE — Telephone Encounter (Signed)
Pt send e-mail regarding bleeding with HRT, I called pt and left message on pt voicemail to call.

## 2016-03-31 NOTE — Telephone Encounter (Signed)
Recommend watch for now. We did evaluate her in December with negative results. Assuming it resolves this week then I'll follow. If it does continue then I need to see her and reassess the endometrial cavity.

## 2016-03-31 NOTE — Telephone Encounter (Signed)
Pt informed with the below note. 

## 2016-04-19 DIAGNOSIS — J020 Streptococcal pharyngitis: Secondary | ICD-10-CM | POA: Insufficient documentation

## 2016-05-04 DIAGNOSIS — J029 Acute pharyngitis, unspecified: Secondary | ICD-10-CM | POA: Insufficient documentation

## 2016-05-05 DIAGNOSIS — J029 Acute pharyngitis, unspecified: Secondary | ICD-10-CM | POA: Insufficient documentation

## 2016-07-18 ENCOUNTER — Ambulatory Visit: Payer: Managed Care, Other (non HMO) | Admitting: Pediatrics

## 2016-07-31 ENCOUNTER — Ambulatory Visit: Payer: Managed Care, Other (non HMO) | Admitting: Pediatrics

## 2016-08-16 ENCOUNTER — Encounter: Payer: Self-pay | Admitting: Surgery

## 2016-08-22 ENCOUNTER — Other Ambulatory Visit: Payer: Self-pay | Admitting: Allergy

## 2016-08-22 MED ORDER — OLOPATADINE HCL 0.2 % OP SOLN: OPHTHALMIC | 5 refills | Status: DC

## 2016-08-22 MED ORDER — OLOPATADINE HCL 0.2 % OP SOLN: 1.0000 [drp] | OPHTHALMIC | 5 refills | Status: DC

## 2016-09-18 ENCOUNTER — Ambulatory Visit (INDEPENDENT_AMBULATORY_CARE_PROVIDER_SITE_OTHER): Payer: Managed Care, Other (non HMO) | Admitting: Pediatrics

## 2016-09-18 ENCOUNTER — Encounter: Payer: Self-pay | Admitting: Pediatrics

## 2016-09-18 VITALS — BP 122/80 | HR 88 | Temp 97.9°F | Resp 16 | Ht 64.0 in | Wt 164.2 lb

## 2016-09-18 DIAGNOSIS — J4520 Mild intermittent asthma, uncomplicated: Secondary | ICD-10-CM

## 2016-09-18 DIAGNOSIS — K219 Gastro-esophageal reflux disease without esophagitis: Secondary | ICD-10-CM | POA: Diagnosis not present

## 2016-09-18 DIAGNOSIS — L501 Idiopathic urticaria: Secondary | ICD-10-CM

## 2016-09-18 MED ORDER — ALBUTEROL SULFATE HFA 108 (90 BASE) MCG/ACT IN AERS: INHALATION_SPRAY | RESPIRATORY_TRACT | 1 refills | Status: DC

## 2016-09-18 MED ORDER — MOMETASONE FUROATE 50 MCG/ACT NA SUSP: NASAL | 5 refills | Status: DC

## 2016-09-18 MED ORDER — EPINEPHRINE 0.3 MG/0.3ML IJ SOAJ: INTRAMUSCULAR | 2 refills | Status: DC

## 2016-09-18 MED ORDER — OLOPATADINE HCL 0.2 % OP SOLN: OPHTHALMIC | 5 refills | Status: DC

## 2016-09-18 NOTE — Progress Notes (Signed)
[place] [address] Dept: [phone]  FOLLOW UP NOTE  Patient ID: Hannah [name], female    DOB: Mar 11, 1961  Age: 56 y.o. MRN: [id number] Date of Office Visit: 09/18/2016  Assessment  Chief Complaint: Asthma (bronchitis 3 weeks ago. seen primary md)  HPI Hannah [name] presents for follow-up of asthma and chronic urticaria. Her asthma was well controlled until about 3 weeks ago when she developed an exacerbation associated with a sinus infection. She was treated with Augmentin and prednisone and her symptoms are much improved. She rarely has to use Ventolin. Her chronic urticaria is well controlled  Her current medications will be continued outlined in the after visit summary   Drug Allergies:  Allergies  Allergen Reactions  . Latex Hives, Itching and Rash    With latex use  Rash, itching and hives no breathing issues With latex use  Rash, itching and hives no breathing issues  . Codeine Nausea And Vomiting    SENSITIVE  . Montelukast Other (See Comments)    nightmares    Physical Exam: BP 122/80 (BP Location: Right Arm, Patient Position: Sitting, Cuff Size: Normal)   Pulse 88   Temp 97.9 F (36.6 C) (Oral)   Resp 16   Ht 5\' 4"  (1.626 m)   Wt 164 lb 3.2 oz (74.5 kg)   LMP 04/12/2012   BMI 28.18 kg/m    Physical Exam  Constitutional: She appears well-developed and well-nourished.  HENT:  Eyes normal. Ears normal. Nose normal. Pharynx normal.  Neck: Neck supple.  Cardiovascular:  S1 and S2 normal no murmurs  Pulmonary/Chest:  Clear to percussion and auscultation  Lymphadenopathy:    She has no cervical adenopathy.  Skin:  Clear  Psychiatric: She has a normal mood and affect. Her behavior is normal. Judgment and thought content normal.  Vitals reviewed.   Diagnostics: FVC 3.38 L FEV1 2.80 L. Predicted FVC 3.50 L predicted FEV1 2.75 L-the spirometry is in the normal range  Assessment and Plan: 1. Mild intermittent asthma  without complication   2. Chronic idiopathic urticaria   3. Gastroesophageal reflux disease without esophagitis     Meds ordered this encounter  Medications  . albuterol (PROVENTIL HFA;VENTOLIN HFA) 108 (90 Base) MCG/ACT inhaler    Sig: Two puffs every 4 hours if needed for wheezing or coughing spells    Dispense:  8 g    Refill:  1  . mometasone (NASONEX) 50 MCG/ACT nasal spray    Sig: TWO SPRAYS EACH NOSTRIL ONCE A DAY FOR NASAL CONGESTION OR DRAINAGE.    Dispense:  17 g    Refill:  5  . Olopatadine HCl (PATADAY) 0.2 % SOLN    Sig: One drop in each eye once a day 10 minutes before using contacts.    Dispense:  2.5 mL    Refill:  5  . EPINEPHrine (EPIPEN 2-PAK) 0.3 mg/0.3 mL IJ SOAJ injection    Sig: Use as directed for severe allergic reaction.    Dispense:  2 Device    Refill:  2    Patient Instructions  Ventolin 2 puffs every 4 hours if needed for wheezing or coughing spells Nasonex 2 sprays per nostril once a day if needed for stuffy nose Fexofenadine 180 mg once a day in the morning for itching Zyrtec 10 mg at night for runny nose or itching Pataday 0.2% one drop in each eye once a day 10 minutes before using contacts Call me if you're not doing  better on this treatment plan  If you have an allergic reaction take Benadryl 50 mg every 4 hours and if you have life-threatening symptoms inject with EpiPen 0.3 mg   Return in about 6 months (around 03/18/2017).    Thank you for the opportunity to care for this patient.  Please do not hesitate to contact me with questions.  Penne Lash, M.D.  [facility] [address] [place], [place] [address] [phone]

## 2016-09-18 NOTE — Patient Instructions (Addendum)
Ventolin 2 puffs every 4 hours if needed for wheezing or coughing spells Nasonex 2 sprays per nostril once a day if needed for stuffy nose Fexofenadine 180 mg once a day in the morning for itching Zyrtec 10 mg at night for runny nose or itching Pataday 0.2% one drop in each eye once a day 10 minutes before using contacts Call me if you're not doing better on this treatment plan  If you have an allergic reaction take Benadryl 50 mg every 4 hours and if you have life-threatening symptoms inject with EpiPen 0.3 mg

## 2016-09-25 ENCOUNTER — Other Ambulatory Visit: Payer: Self-pay | Admitting: Gynecology

## 2016-09-25 DIAGNOSIS — Z1231 Encounter for screening mammogram for malignant neoplasm of breast: Secondary | ICD-10-CM

## 2016-10-04 ENCOUNTER — Encounter: Payer: Self-pay | Admitting: Surgery

## 2016-10-04 ENCOUNTER — Ambulatory Visit (INDEPENDENT_AMBULATORY_CARE_PROVIDER_SITE_OTHER): Payer: Managed Care, Other (non HMO) | Admitting: Surgery

## 2016-10-04 VITALS — BP 123/86 | HR 90 | Temp 98.2°F | Ht 64.0 in | Wt 163.4 lb

## 2016-10-04 DIAGNOSIS — K439 Ventral hernia without obstruction or gangrene: Secondary | ICD-10-CM

## 2016-10-04 NOTE — Progress Notes (Signed)
Surgical Consultation  10/04/2016  Hannah [name] is an 56 y.o. female.   CC: Abdominal bulge  HPI: This patient status post laparoscopic cholecystectomy which was performed emergently in March 2017. Since last month she has noticed a bulge in the area of the epigastric port site. She's had no pain associated with it but was just concerned about what was happening. Also of note she states that all of the symptoms of reflux and "indigestion" have gone away since her gallbladder was removed she is no longer taking Nexium that she had been on for years.  Past Medical History:  Diagnosis Date  . Anxiety   . Asthma   . Environmental allergies   . Herpes labialis   . Hiatal hernia   . Urinary incontinence   . Urticaria     Past Surgical History:  Procedure Laterality Date  . ABDOMINAL SURGERY  2001   MYOMECTOMY   . BLADDER SUSPENSION  2003   [place]or transobturator pubovaginal sling Dr Gaynelle Arabian  . CHOLECYSTECTOMY N/A 11/10/2015   Procedure: LAPAROSCOPIC CHOLECYSTECTOMY;  Surgeon: Florene Glen, MD;  Location: [facility] ORS;  Service: General;  Laterality: N/A;  . HYSTEROSCOPY     D&C, HYST X 3    Family History  Problem Relation Age of Onset  . Cancer Maternal Uncle     2 MATERNAL UNCLES W COLON CA  . Heart disease Maternal Grandmother   . Hypertension Maternal Grandmother   . Cancer Maternal Grandfather     Lung  . Cancer Paternal Grandfather     Lung  . Myasthenia gravis Father   . Allergic rhinitis Neg Hx   . Angioedema Neg Hx   . Asthma Neg Hx   . Immunodeficiency Neg Hx   . Eczema Neg Hx   . Urticaria Neg Hx     Social History:  reports that she quit smoking about 3 years ago. Her smoking use included Cigarettes. She has never used smokeless tobacco. She reports that she drinks about 1.8 oz of alcohol per week . She reports that she does not use drugs.  Allergies:  Allergies  Allergen Reactions  . Latex Hives, Itching and Rash    With latex use  Rash,  itching and hives no breathing issues   . Codeine Nausea And Vomiting    SENSITIVE  . Montelukast Other (See Comments)    nightmares    Medications reviewed.   Review of Systems:   Review of Systems  Constitutional: Negative.   HENT: Negative.   Eyes: Negative.   Respiratory: Negative.   Cardiovascular: Negative.   Gastrointestinal: Negative for abdominal pain, constipation, diarrhea, heartburn, nausea and vomiting.  Genitourinary: Negative.   Musculoskeletal: Negative.   Skin: Negative.   Neurological: Negative.   Endo/Heme/Allergies: Negative.   Psychiatric/Behavioral: Negative.      Physical Exam:  BP 123/86   Pulse 90   Temp 98.2 F (36.8 C) (Oral)   Ht 5\' 4"  (1.626 m)   Wt 163 lb 6.4 oz (74.1 kg)   LMP 04/12/2012   BMI 28.05 kg/m   Physical Exam  Constitutional: She is oriented to person, place, and time and well-developed, well-nourished, and in no distress. No distress.  HENT:  Head: Normocephalic and atraumatic.  Eyes: Pupils are equal, round, and reactive to light. Right eye exhibits no discharge. Left eye exhibits no discharge. No scleral icterus.  Abdominal: Soft. She exhibits no distension. There is no tenderness. There is no rebound and no guarding.  [place] reducible hernia  beneath the epigastric  Site. It is completely reducible completely nontender nonerythematous. She is examined supine standing and with Valsalva.  Musculoskeletal: Normal range of motion. She exhibits no edema.  Lymphadenopathy:    She has no cervical adenopathy.  Neurological: She is alert and oriented to person, place, and time.  Skin: Skin is warm and dry. No rash noted. She is not diaphoretic. No erythema.  Psychiatric: Mood and affect normal.  Vitals reviewed.     No results found for this or any previous visit (from the past 48 hour(s)). No results found.  Assessment/Plan:  Small subxiphoid ventral hernia following laparoscopic cholecystectomy. It is wid[place]outh  and nontender. I suspect that this is over the falciform ligament. There is little risk of bowel incarceration in this rather high hernia near the xiphoid. I discussed with the patient the options of surgical intervention and the need for mesh placement. The patient is weighing this idea of repairing it versus observation because it is not causing her any pain at all and she stays fairly local. A call me with her decision if she chooses to have it repaired. I discussed with her the risks and a bleeding infection recurrence and cosmetic deformity as well as mesh placement.  Florene Glen, MD, FACS

## 2016-10-04 NOTE — Patient Instructions (Signed)
Please call our office if you decide to move forward with surgery to repair your hernia.

## 2016-10-10 ENCOUNTER — Other Ambulatory Visit: Payer: Self-pay | Admitting: Allergy

## 2016-10-10 MED ORDER — EPINEPHRINE 0.3 MG/0.3ML IJ SOAJ
0.3000 mg | Freq: Once | INTRAMUSCULAR | 1 refills | Status: AC
Start: 2016-10-10 — End: 2016-10-10

## 2016-10-12 ENCOUNTER — Encounter: Payer: Self-pay | Admitting: Gynecology

## 2016-10-12 ENCOUNTER — Ambulatory Visit (INDEPENDENT_AMBULATORY_CARE_PROVIDER_SITE_OTHER): Payer: 59 | Admitting: Gynecology

## 2016-10-12 VITALS — BP 116/74 | Ht 64.0 in | Wt 164.0 lb

## 2016-10-12 DIAGNOSIS — Z01411 Encounter for gynecological examination (general) (routine) with abnormal findings: Secondary | ICD-10-CM

## 2016-10-12 DIAGNOSIS — Z79890 Hormone replacement therapy (postmenopausal): Secondary | ICD-10-CM

## 2016-10-12 DIAGNOSIS — R635 Abnormal weight gain: Secondary | ICD-10-CM | POA: Diagnosis not present

## 2016-10-12 DIAGNOSIS — Z13220 Encounter for screening for lipoid disorders: Secondary | ICD-10-CM

## 2016-10-12 DIAGNOSIS — N952 Postmenopausal atrophic vaginitis: Secondary | ICD-10-CM | POA: Diagnosis not present

## 2016-10-12 LAB — CBC WITH DIFFERENTIAL/PLATELET
BASOS ABS: 91 {cells}/uL (ref 0–200)
BASOS PCT: 1 %
EOS ABS: 91 {cells}/uL (ref 15–500)
Eosinophils Relative: 1 %
HEMATOCRIT: 42 % (ref 35.0–45.0)
Hemoglobin: 14 g/dL (ref 11.7–15.5)
LYMPHS PCT: 32 %
Lymphs Abs: 2912 cells/uL (ref 850–3900)
MCH: 30.3 pg (ref 27.0–33.0)
MCHC: 33.3 g/dL (ref 32.0–36.0)
MCV: 90.9 fL (ref 80.0–100.0)
MONO ABS: 910 {cells}/uL (ref 200–950)
MONOS PCT: 10 %
MPV: 10.2 fL (ref 7.5–12.5)
Neutro Abs: 5096 cells/uL (ref 1500–7800)
Neutrophils Relative %: 56 %
PLATELETS: 445 10*3/uL — AB (ref 140–400)
RBC: 4.62 MIL/uL (ref 3.80–5.10)
RDW: 13.4 % (ref 11.0–15.0)
WBC: 9.1 10*3/uL (ref 3.8–10.8)

## 2016-10-12 LAB — LIPID PANEL
CHOL/HDL RATIO: 2.7 ratio (ref <5.0)
CHOLESTEROL: 209 mg/dL — AB (ref <200)
HDL: 77 mg/dL (ref >50)
LDL Cholesterol: 110 mg/dL — ABNORMAL HIGH (ref <100)
Triglycerides: 112 mg/dL (ref <150)
VLDL: 22 mg/dL (ref <30)

## 2016-10-12 LAB — COMPREHENSIVE METABOLIC PANEL
ALK PHOS: 58 U/L (ref 33–130)
ALT: 12 U/L (ref 6–29)
AST: 17 U/L (ref 10–35)
Albumin: 4.3 g/dL (ref 3.6–5.1)
BUN: 15 mg/dL (ref 7–25)
CALCIUM: 9.5 mg/dL (ref 8.6–10.4)
CHLORIDE: 104 mmol/L (ref 98–110)
CO2: 23 mmol/L (ref 20–31)
Creat: 0.76 mg/dL (ref 0.50–1.05)
GLUCOSE: 86 mg/dL (ref 65–99)
POTASSIUM: 4.2 mmol/L (ref 3.5–5.3)
Sodium: 138 mmol/L (ref 135–146)
TOTAL PROTEIN: 6.9 g/dL (ref 6.1–8.1)
Total Bilirubin: 0.5 mg/dL (ref 0.2–1.2)

## 2016-10-12 LAB — TSH: TSH: 2.16 mIU/L

## 2016-10-12 MED ORDER — PROGESTERONE MICRONIZED 100 MG PO CAPS
100.0000 mg | ORAL_CAPSULE | Freq: Every day | ORAL | 12 refills | Status: DC
Start: 2016-10-12 — End: 2016-12-18

## 2016-10-12 MED ORDER — ESTRADIOL 0.1 MG/24HR TD PTTW: 1.0000 | MEDICATED_PATCH | TRANSDERMAL | 12 refills | Status: DC

## 2016-10-12 NOTE — Patient Instructions (Signed)

## 2016-10-12 NOTE — Progress Notes (Signed)
    Hannah [name] August 28, 1960 [id number]        56 y.o.  [id number] for annual exam.    Past medical history,surgical history, problem list, medications, allergies, family history and social history were all reviewed and documented as reviewed in the EPIC chart.  ROS:  Performed with pertinent positives and negatives included in the history, assessment and plan.   Additional significant findings :  None   Exam: Hannah [name] assistant Vitals:   10/12/16 1531  BP: 116/74  Weight: 164 lb (74.4 kg)  Height: 5\' 4"  (1.626 m)   Body mass index is 28.15 kg/m.  General appearance:  Normal affect, orientation and appearance. Skin: Grossly normal HEENT: Without gross lesions.  No cervical or supraclavicular adenopathy. Thyroid normal.  Lungs:  Clear without wheezing, rales or rhonchi Cardiac: RR, without RMG Abdominal:  Soft, nontender, without masses, guarding, rebound, organomegaly or hernia Breasts:  Examined lying and sitting without masses, retractions, discharge or axillary adenopathy. Pelvic:  Ext, BUS, Vagina: Normal with mild atrophic changes. Bladder sling palpated submucosally anterior vaginal wall  Cervix: Normal with mild atrophic changes  Uterus: Anteverted, normal size, shape and contour, midline and mobile nontender   Adnexa: Without masses or tenderness    Anus and perineum: Normal   Rectovaginal: Normal sphincter tone without palpated masses or tenderness.    Assessment/Plan:  56 y.o. [id number] female for annual exam.   1. Postmenopausal/atrophic genital changes/HRT. Continues on Vivelle 0.1 mg and Prometrium 100 mg nightly. Doing well and wants to continue. I reviewed the risks versus benefits to include the [facility] 2017 guidelines on HRT. Benefits to include symptom relief as well as cardiovascular and bone health if started early versus risks to include stroke heart attack DVT and breast cancer. At this point patient's comfortable continuing. Refill 1 year provided.  Will call if any bleeding. 2. Mammography due in patient will follow up for this. SBE monthly reviewed. 3. Colonoscopy 2015. Repeat at their recommended interval. 4. Pap smear/HPV 08/2013. No Pap smear done today. No history of abnormal Pap smears previously. Plan repeat Pap smear approaching 5 year interval. 5. Health maintenance. Baseline CBC, CMP, lipid profile, TSH, vitamin D and urinalysis ordered. Follow up 1 year, sooner as needed.   [name] MD, 4:12 PM 10/12/2016

## 2016-10-13 LAB — URINALYSIS W MICROSCOPIC + REFLEX CULTURE
BILIRUBIN URINE: NEGATIVE
Bacteria, UA: NONE SEEN [HPF]
Casts: NONE SEEN [LPF]
Crystals: NONE SEEN [HPF]
GLUCOSE, UA: NEGATIVE
Hgb urine dipstick: NEGATIVE
Nitrite: NEGATIVE
Protein, ur: NEGATIVE
SPECIFIC GRAVITY, URINE: 1.017 (ref 1.001–1.035)
YEAST: NONE SEEN [HPF]
pH: 5.5 (ref 5.0–8.0)

## 2016-10-13 LAB — VITAMIN D 25 HYDROXY (VIT D DEFICIENCY, FRACTURES): Vit D, 25-Hydroxy: 37 ng/mL (ref 30–100)

## 2016-10-14 LAB — URINE CULTURE: ORGANISM ID, BACTERIA: NO GROWTH

## 2016-10-17 ENCOUNTER — Ambulatory Visit
Admission: RE | Admit: 2016-10-17 | Discharge: 2016-10-17 | Disposition: A | Discharge status: Home / Self Care | Payer: Managed Care, Other (non HMO) | Source: Ambulatory Visit | Attending: Gynecology | Admitting: Gynecology

## 2016-10-17 DIAGNOSIS — Z1231 Encounter for screening mammogram for malignant neoplasm of breast: Secondary | ICD-10-CM

## 2016-11-22 ENCOUNTER — Telehealth: Payer: Self-pay | Admitting: General Practice

## 2016-11-22 NOTE — Telephone Encounter (Signed)
error 

## 2016-11-27 ENCOUNTER — Other Ambulatory Visit: Payer: Self-pay

## 2016-11-28 ENCOUNTER — Other Ambulatory Visit: Payer: Self-pay

## 2016-11-29 ENCOUNTER — Ambulatory Visit (INDEPENDENT_AMBULATORY_CARE_PROVIDER_SITE_OTHER): Payer: Managed Care, Other (non HMO) | Admitting: Surgery

## 2016-11-29 ENCOUNTER — Encounter: Payer: Self-pay | Admitting: Surgery

## 2016-11-29 VITALS — BP 134/80 | HR 92 | Temp 98.2°F | Ht 64.0 in | Wt 161.4 lb

## 2016-11-29 DIAGNOSIS — K439 Ventral hernia without obstruction or gangrene: Secondary | ICD-10-CM

## 2016-11-29 NOTE — Progress Notes (Signed)
Outpatient Surgical Follow Up  11/29/2016  Hannah [name] is an 56 y.o. female.   CC:VH  HPI: This a patient with a ventral hernia from the epigastric port site from prior laparoscopic cholecystectomy. She has been seen in the past and we discussed potential surgery she is now ready for surgery. Patient describes no pain no nausea or vomiting essentially no symptoms but she can reduce it easily.  Past Medical History:  Diagnosis Date  . Anxiety   . Asthma   . Environmental allergies   . Hernia of abdominal wall   . Herpes labialis   . Hiatal hernia   . Urinary incontinence   . Urticaria     Past Surgical History:  Procedure Laterality Date  . ABDOMINAL SURGERY  2001   MYOMECTOMY   . BLADDER SUSPENSION  2003   Mentor transobturator pubovaginal sling Dr Gaynelle Arabian  . BREAST BIOPSY Right 12/21/2010   [place]  . CHOLECYSTECTOMY N/A 11/10/2015   Procedure: LAPAROSCOPIC CHOLECYSTECTOMY;  Surgeon: Florene Glen, MD;  Location: [facility] ORS;  Service: General;  Laterality: N/A;  . HYSTEROSCOPY     D&C, HYST X 3    Family History  Problem Relation Age of Onset  . Cancer Maternal Uncle     2 MATERNAL UNCLES W COLON CA  . Heart disease Maternal Grandmother   . Hypertension Maternal Grandmother   . Cancer Maternal Grandfather     Lung  . Cancer Paternal Grandfather     Lung  . Myasthenia gravis Father   . Alzheimer's disease Father   . Allergic rhinitis Neg Hx   . Angioedema Neg Hx   . Asthma Neg Hx   . Immunodeficiency Neg Hx   . Eczema Neg Hx   . Urticaria Neg Hx     Social History:  reports that she quit smoking about 4 years ago. Her smoking use included Cigarettes. She has never used smokeless tobacco. She reports that she drinks about 1.8 oz of alcohol per week . She reports that she does not use drugs.  Allergies:  Allergies  Allergen Reactions  . Latex Hives, Itching and Rash    With latex use  Rash, itching and hives no breathing issues   . Codeine Nausea And  Vomiting    SENSITIVE  . Montelukast Other (See Comments)    nightmares    Medications reviewed.   Review of Systems:   Review of Systems  Constitutional: Negative.   HENT: Negative.   Eyes: Negative.   Respiratory: Negative.   Cardiovascular: Negative.   Gastrointestinal: Negative.   Genitourinary: Negative.   Musculoskeletal: Negative.   Skin: Negative.   Neurological: Negative.   Endo/Heme/Allergies: Negative.   Psychiatric/Behavioral: Negative.      Physical Exam:  BP 134/80   Pulse 92   Temp 98.2 F (36.8 C) (Oral)   Ht 5\' 4"  (1.626 m)   Wt 161 lb 6.4 oz (73.2 kg)   LMP 04/12/2012   BMI 27.70 kg/m   Physical Exam  Constitutional: She is oriented to person, place, and time and well-developed, well-nourished, and in no distress. No distress.  HENT:  Head: Normocephalic and atraumatic.  Eyes: Pupils are equal, round, and reactive to light. Right eye exhibits no discharge. Left eye exhibits no discharge. No scleral icterus.  Neck: Normal range of motion.  Cardiovascular: Normal rate, regular rhythm and normal heart sounds.   Pulmonary/Chest: Effort normal and breath sounds normal. No respiratory distress. She has no wheezes. She has no rales.  Abdominal: Soft. She exhibits no distension. There is no tenderness. There is no rebound.  Scars from prior laparoscopic cholecystectomy. In the epigastric port site there is a palpable and reducible ventral hernia without erythema and nontender  Musculoskeletal: Normal range of motion. She exhibits no edema.  Lymphadenopathy:    She has no cervical adenopathy.  Neurological: She is alert and oriented to person, place, and time.  Skin: Skin is warm and dry. She is not diaphoretic.  Psychiatric: Mood and affect normal.  Vitals reviewed.     No results found for this or any previous visit (from the past 48 hour(s)). No results found.  Assessment/Plan:  Ventral hernia. Recommend repair discussed with her the  rationale for surgery the options of observation risk bleeding infection mesh placement mesh infection recurrence and cosmetic deformity she understood and agreed to proceed postoperative recovery was described.  Florene Glen, MD, FACS

## 2016-11-29 NOTE — Patient Instructions (Addendum)
We have your surgery scheduled for 12/26/16 with Dr.Cooper at [facility]. Please see your blue pre-care sheet for surgery information. Please Call our office if you have questions or concerns.

## 2016-12-01 ENCOUNTER — Telehealth: Payer: Self-pay

## 2016-12-01 NOTE — Telephone Encounter (Signed)
No authorization required for CPT 70017,49449 and ICD 10-K43.9 per Matt O. @ [employer]. Surgery date 12/26/16

## 2016-12-01 NOTE — Telephone Encounter (Signed)
Called patient to advise of Surgery Date as well as Pre-Admission appointment date, time, and location. No answer. Left voicemail for return phone call.  If patient returns call, surgery information is below:  Surgery Date: 12/26/16  Pre-admit Appointment: 12/19/16 at 0900am  Patient has been advised to call [phone] the day before surgery between 1-3pm to obtain arrival time.

## 2016-12-01 NOTE — Telephone Encounter (Signed)
I reviewed the information documented below to the patient. She has another appointment scheduled on May 8. I gave her pre-admits phone number, [phone] and advised her to call that number to reschedule her appointment.   Patient understood and had no further questions.

## 2016-12-03 ENCOUNTER — Other Ambulatory Visit: Payer: Self-pay | Admitting: Gynecology

## 2016-12-07 ENCOUNTER — Telehealth: Payer: Self-pay

## 2016-12-07 NOTE — Telephone Encounter (Signed)
Disability Paperwork has been received and a $25.00 collection fee has been obtained.  Disability paperwork has been obtained.

## 2016-12-13 ENCOUNTER — Encounter: Payer: Self-pay | Admitting: Surgery

## 2016-12-13 NOTE — Telephone Encounter (Signed)
Patient's FMLA was filled up and faxed per patient's request. Patient has her surgery until 12/26/2016 with Dr. Burt Knack.

## 2016-12-14 ENCOUNTER — Telehealth: Payer: Self-pay

## 2016-12-14 NOTE — Telephone Encounter (Signed)
FMLA doucments have been scanned into EPIC under Media.

## 2016-12-19 ENCOUNTER — Other Ambulatory Visit: Payer: Managed Care, Other (non HMO)

## 2016-12-19 ENCOUNTER — Other Ambulatory Visit: Payer: Self-pay | Admitting: Allergy

## 2016-12-19 ENCOUNTER — Encounter
Admission: RE | Admit: 2016-12-19 | Discharge: 2016-12-19 | Disposition: A | Discharge status: Home / Self Care | Payer: 59 | Source: Ambulatory Visit | Attending: Surgery | Admitting: Surgery

## 2016-12-19 ENCOUNTER — Telehealth: Payer: Self-pay | Admitting: Allergy

## 2016-12-19 DIAGNOSIS — Z01812 Encounter for preprocedural laboratory examination: Secondary | ICD-10-CM | POA: Diagnosis not present

## 2016-12-19 DIAGNOSIS — Z01818 Encounter for other preprocedural examination: Secondary | ICD-10-CM | POA: Diagnosis present

## 2016-12-19 HISTORY — DX: Personal history of other diseases of the digestive system: Z87.19

## 2016-12-19 HISTORY — DX: Depression, unspecified: F32.A

## 2016-12-19 HISTORY — DX: Panic disorder (episodic paroxysmal anxiety): F41.0

## 2016-12-19 HISTORY — DX: Other intervertebral disc displacement, lumbar region: M51.26

## 2016-12-19 HISTORY — DX: Major depressive disorder, single episode, unspecified: F32.9

## 2016-12-19 LAB — BASIC METABOLIC PANEL
Anion gap: 9 (ref 5–15)
BUN: 18 mg/dL (ref 6–20)
CALCIUM: 9.5 mg/dL (ref 8.9–10.3)
CO2: 26 mmol/L (ref 22–32)
Chloride: 99 mmol/L — ABNORMAL LOW (ref 101–111)
Creatinine, Ser: 0.55 mg/dL (ref 0.44–1.00)
GLUCOSE: 84 mg/dL (ref 65–99)
Potassium: 3.8 mmol/L (ref 3.5–5.1)
SODIUM: 134 mmol/L — AB (ref 135–145)

## 2016-12-19 LAB — CBC WITH DIFFERENTIAL/PLATELET
BASOS ABS: 0.1 10*3/uL (ref 0–0.1)
BASOS PCT: 1 %
Eosinophils Absolute: 0.1 10*3/uL (ref 0–0.7)
Eosinophils Relative: 1 %
HEMATOCRIT: 40.6 % (ref 35.0–47.0)
Hemoglobin: 14 g/dL (ref 12.0–16.0)
Lymphocytes Relative: 21 %
Lymphs Abs: 2.3 10*3/uL (ref 1.0–3.6)
MCH: 31.1 pg (ref 26.0–34.0)
MCHC: 34.6 g/dL (ref 32.0–36.0)
MCV: 90.1 fL (ref 80.0–100.0)
Monocytes Absolute: 1 10*3/uL — ABNORMAL HIGH (ref 0.2–0.9)
Monocytes Relative: 9 %
NEUTROS ABS: 7.5 10*3/uL — AB (ref 1.4–6.5)
NEUTROS PCT: 68 %
Platelets: 398 10*3/uL (ref 150–440)
RBC: 4.51 MIL/uL (ref 3.80–5.20)
RDW: 13 % (ref 11.5–14.5)
WBC: 11 10*3/uL (ref 3.6–11.0)

## 2016-12-19 MED ORDER — ALBUTEROL SULFATE HFA 108 (90 BASE) MCG/ACT IN AERS: INHALATION_SPRAY | RESPIRATORY_TRACT | 1 refills | Status: DC

## 2016-12-19 MED ORDER — ALBUTEROL SULFATE HFA 108 (90 BASE) MCG/ACT IN AERS: 2.0000 | INHALATION_SPRAY | RESPIRATORY_TRACT | 1 refills | Status: DC | PRN

## 2016-12-19 NOTE — Patient Instructions (Signed)
Your procedure is scheduled on: Dec 26, 2016 (Tuesday) Report to Same Day Surgery 2nd floor medical mall [facility] Entrance-take elevator on left to 2nd floor.  Check in with surgery information desk.) To find out your arrival time please call [phone] between 1PM - 3PM on  Dec 25, 2016 (Monday)  Remember: Instructions that are not followed completely may result in serious medical risk, up to and including death, or upon the discretion of your surgeon and anesthesiologist your surgery may need to be rescheduled.    _x___ 1. Do not eat food or drink liquids after midnight. No gum chewing or hard candies                         __x__ 2. No Alcohol for 24 hours before or after surgery.   __x__3. No Smoking for 24 prior to surgery.   ____  4. Bring all medications with you on the day of surgery if instructed.    __x__ 5. Notify your doctor if there is any change in your medical condition     (cold, fever, infections).     Do not wear jewelry, make-up, hairpins, clips or nail polish.  Do not wear lotions, powders, or perfumes. You may wear deodorant.  Do not shave 48 hours prior to surgery. Men may shave face and neck.  Do not bring valuables to the hospital.    [facility] is not responsible for any belongings or valuables.               Contacts, dentures or bridgework may not be worn into surgery.  Leave your suitcase in the car. After surgery it may be brought to your room.  For patients admitted to the hospital, discharge time is determined by your  treatment team                       Patients discharged the day of surgery will not be allowed to drive home.  You will need someone to drive you home and stay with you the night of your procedure.    Please read over the following fact sheets that you were given:   [facility] Preparing for Surgery and or MRSA Information   _x___ Take anti-hypertensive (unless it includes a diuretic), cardiac, seizure, asthma,     anti-reflux  and psychiatric medicines. These include:  1. Ranitidine (Ranitidine at bedtime on Monday night May 14 )  2.  3.  4.  5.  6.  ____Fleets enema or Magnesium Citrate as directed.   _x___ Use CHG Soap or sage wipes as directed on instruction sheet   _x___ Use inhalers on the day of surgery and bring to hospital day of surgery (Use Albuterol inhaler the morning of surgery and bring to hospital )  ____ Stop Metformin and Janumet 2 days prior to surgery.    ____ Take 1/2 of usual insulin dose the night before surgery and none on the morning surgery      _x___ Follow recommendations from Cardiologist, Pulmonologist or PCP regarding          stopping Aspirin, Coumadin, Pllavix ,Eliquis, Effient, or Pradaxa, and Pletal.  X____Stop Anti-inflammatories such as Advil, Aleve, Ibuprofen, Motrin, Naproxen, Naprosyn, Goodies powders or aspirin products. OK to take Tylenol.    _x___ Stop supplements until after surgery.  But may continue Vitamin D, Vitamin B,  and multivitamin        ____  Bring C-Pap to the hospital.

## 2016-12-19 NOTE — Telephone Encounter (Signed)
Insurance does not cover Proventil HFA. Faxed in [facility]

## 2016-12-20 LAB — SURGICAL PCR SCREEN

## 2016-12-20 NOTE — Pre-Procedure Instructions (Signed)
LM TO COME BACK FOR REPEAT MRSA

## 2016-12-21 ENCOUNTER — Encounter
Admission: RE | Admit: 2016-12-21 | Discharge: 2016-12-21 | Disposition: A | Discharge status: Home / Self Care | Payer: PRIVATE HEALTH INSURANCE | Source: Ambulatory Visit | Attending: Surgery | Admitting: Surgery

## 2016-12-21 DIAGNOSIS — J45909 Unspecified asthma, uncomplicated: Secondary | ICD-10-CM | POA: Diagnosis not present

## 2016-12-21 DIAGNOSIS — I2510 Atherosclerotic heart disease of native coronary artery without angina pectoris: Secondary | ICD-10-CM | POA: Diagnosis not present

## 2016-12-21 DIAGNOSIS — F419 Anxiety disorder, unspecified: Secondary | ICD-10-CM | POA: Diagnosis not present

## 2016-12-21 DIAGNOSIS — Z79899 Other long term (current) drug therapy: Secondary | ICD-10-CM | POA: Diagnosis not present

## 2016-12-21 DIAGNOSIS — F329 Major depressive disorder, single episode, unspecified: Secondary | ICD-10-CM | POA: Diagnosis not present

## 2016-12-21 DIAGNOSIS — Z79890 Hormone replacement therapy (postmenopausal): Secondary | ICD-10-CM | POA: Diagnosis not present

## 2016-12-21 DIAGNOSIS — K439 Ventral hernia without obstruction or gangrene: Secondary | ICD-10-CM | POA: Diagnosis not present

## 2016-12-21 DIAGNOSIS — Z87891 Personal history of nicotine dependence: Secondary | ICD-10-CM | POA: Diagnosis not present

## 2016-12-21 DIAGNOSIS — K219 Gastro-esophageal reflux disease without esophagitis: Secondary | ICD-10-CM | POA: Diagnosis not present

## 2016-12-21 LAB — SURGICAL PCR SCREEN
MRSA, PCR: NEGATIVE
STAPHYLOCOCCUS AUREUS: NEGATIVE

## 2016-12-26 ENCOUNTER — Ambulatory Visit
Admission: RE | Admit: 2016-12-26 | Discharge: 2016-12-26 | Disposition: A | Discharge status: Home / Self Care | Payer: 59 | Source: Ambulatory Visit | Attending: Surgery | Admitting: Surgery

## 2016-12-26 ENCOUNTER — Encounter: Payer: Self-pay | Admitting: *Deleted

## 2016-12-26 ENCOUNTER — Encounter
Admission: RE | Disposition: A | Discharge status: Home / Self Care | Payer: Self-pay | Source: Ambulatory Visit | Attending: Surgery

## 2016-12-26 ENCOUNTER — Ambulatory Visit: Payer: 59 | Admitting: Registered Nurse

## 2016-12-26 DIAGNOSIS — J45909 Unspecified asthma, uncomplicated: Secondary | ICD-10-CM | POA: Insufficient documentation

## 2016-12-26 DIAGNOSIS — K439 Ventral hernia without obstruction or gangrene: Secondary | ICD-10-CM | POA: Insufficient documentation

## 2016-12-26 DIAGNOSIS — K432 Incisional hernia without obstruction or gangrene: Secondary | ICD-10-CM | POA: Diagnosis not present

## 2016-12-26 DIAGNOSIS — K219 Gastro-esophageal reflux disease without esophagitis: Secondary | ICD-10-CM | POA: Insufficient documentation

## 2016-12-26 DIAGNOSIS — I2510 Atherosclerotic heart disease of native coronary artery without angina pectoris: Secondary | ICD-10-CM | POA: Insufficient documentation

## 2016-12-26 DIAGNOSIS — Z79890 Hormone replacement therapy (postmenopausal): Secondary | ICD-10-CM | POA: Insufficient documentation

## 2016-12-26 DIAGNOSIS — Z87891 Personal history of nicotine dependence: Secondary | ICD-10-CM | POA: Insufficient documentation

## 2016-12-26 DIAGNOSIS — Z79899 Other long term (current) drug therapy: Secondary | ICD-10-CM | POA: Insufficient documentation

## 2016-12-26 DIAGNOSIS — F329 Major depressive disorder, single episode, unspecified: Secondary | ICD-10-CM | POA: Insufficient documentation

## 2016-12-26 DIAGNOSIS — F419 Anxiety disorder, unspecified: Secondary | ICD-10-CM | POA: Insufficient documentation

## 2016-12-26 HISTORY — PX: VENTRAL HERNIA REPAIR: SHX424

## 2016-12-26 SURGERY — REPAIR, HERNIA, VENTRAL
Anesthesia: General

## 2016-12-26 MED ORDER — GLYCOPYRROLATE 0.2 MG/ML IJ SOLN
INTRAMUSCULAR | Status: AC
  Filled 2016-12-26: qty 1

## 2016-12-26 MED ORDER — CEFAZOLIN SODIUM-DEXTROSE 2-4 GM/100ML-% IV SOLN
INTRAVENOUS | Status: AC
  Filled 2016-12-26: qty 100

## 2016-12-26 MED ORDER — DEXAMETHASONE SODIUM PHOSPHATE 10 MG/ML IJ SOLN
INTRAMUSCULAR | Status: AC
  Filled 2016-12-26: qty 1

## 2016-12-26 MED ORDER — PROPOFOL 10 MG/ML IV BOLUS
INTRAVENOUS | Status: DC | PRN
  Administered 2016-12-26: 150 mg via INTRAVENOUS

## 2016-12-26 MED ORDER — CEFAZOLIN SODIUM-DEXTROSE 2-4 GM/100ML-% IV SOLN
2.0000 g | INTRAVENOUS | Status: AC
  Administered 2016-12-26: 2 g via INTRAVENOUS

## 2016-12-26 MED ORDER — SODIUM CHLORIDE 0.9 % IV SOLN
INTRAVENOUS | Status: DC | PRN
  Administered 2016-12-26: 11:00:00 via INTRAVENOUS

## 2016-12-26 MED ORDER — PROPOFOL 10 MG/ML IV BOLUS
INTRAVENOUS | Status: AC
  Filled 2016-12-26: qty 40

## 2016-12-26 MED ORDER — MIDAZOLAM HCL 2 MG/2ML IJ SOLN
INTRAMUSCULAR | Status: DC | PRN
  Administered 2016-12-26: 2 mg via INTRAVENOUS

## 2016-12-26 MED ORDER — LIDOCAINE HCL (PF) 2 % IJ SOLN
INTRAMUSCULAR | Status: AC
  Filled 2016-12-26: qty 2

## 2016-12-26 MED ORDER — ACETAMINOPHEN 10 MG/ML IV SOLN
INTRAVENOUS | Status: DC | PRN
  Administered 2016-12-26: 1000 mg via INTRAVENOUS

## 2016-12-26 MED ORDER — FENTANYL CITRATE (PF) 100 MCG/2ML IJ SOLN
INTRAMUSCULAR | Status: AC
  Administered 2016-12-26: 25 ug via INTRAVENOUS
  Filled 2016-12-26: qty 2

## 2016-12-26 MED ORDER — DEXAMETHASONE SODIUM PHOSPHATE 10 MG/ML IJ SOLN
INTRAMUSCULAR | Status: DC | PRN
  Administered 2016-12-26: 10 mg via INTRAVENOUS

## 2016-12-26 MED ORDER — HYDROCODONE-ACETAMINOPHEN 5-325 MG PO TABS
ORAL_TABLET | ORAL | Status: AC
  Administered 2016-12-26: 1 via ORAL
  Filled 2016-12-26: qty 1

## 2016-12-26 MED ORDER — BUPIVACAINE-EPINEPHRINE (PF) 0.25% -1:200000 IJ SOLN
INTRAMUSCULAR | Status: DC | PRN
  Administered 2016-12-26 (×3): 10 mL

## 2016-12-26 MED ORDER — ONDANSETRON HCL 4 MG/2ML IJ SOLN: 4.0000 mg | Freq: Once | INTRAMUSCULAR | Status: DC | PRN

## 2016-12-26 MED ORDER — HYDROCODONE-ACETAMINOPHEN 5-300 MG PO TABS: 1.0000 | ORAL_TABLET | ORAL | 0 refills | Status: DC | PRN

## 2016-12-26 MED ORDER — FENTANYL CITRATE (PF) 100 MCG/2ML IJ SOLN
INTRAMUSCULAR | Status: DC | PRN
  Administered 2016-12-26 (×2): 50 ug via INTRAVENOUS

## 2016-12-26 MED ORDER — ACETAMINOPHEN 10 MG/ML IV SOLN
INTRAVENOUS | Status: AC
  Filled 2016-12-26: qty 100

## 2016-12-26 MED ORDER — BUPIVACAINE-EPINEPHRINE (PF) 0.25% -1:200000 IJ SOLN
INTRAMUSCULAR | Status: AC
  Filled 2016-12-26: qty 30

## 2016-12-26 MED ORDER — FENTANYL CITRATE (PF) 100 MCG/2ML IJ SOLN
INTRAMUSCULAR | Status: AC
  Filled 2016-12-26: qty 2

## 2016-12-26 MED ORDER — HEPARIN SODIUM (PORCINE) 5000 UNIT/ML IJ SOLN
INTRAMUSCULAR | Status: AC
  Administered 2016-12-26: 5000 [IU]
  Filled 2016-12-26: qty 1

## 2016-12-26 MED ORDER — LIDOCAINE HCL (CARDIAC) 20 MG/ML IV SOLN
INTRAVENOUS | Status: DC | PRN
  Administered 2016-12-26: 80 mg via INTRAVENOUS

## 2016-12-26 MED ORDER — HYDROCODONE-ACETAMINOPHEN 5-325 MG PO TABS
1.0000 | ORAL_TABLET | Freq: Once | ORAL | Status: AC
  Administered 2016-12-26: 1 via ORAL

## 2016-12-26 MED ORDER — ONDANSETRON HCL 4 MG/2ML IJ SOLN
INTRAMUSCULAR | Status: AC
  Filled 2016-12-26: qty 2

## 2016-12-26 MED ORDER — FENTANYL CITRATE (PF) 100 MCG/2ML IJ SOLN
25.0000 ug | INTRAMUSCULAR | Status: DC | PRN
  Administered 2016-12-26 (×2): 25 ug via INTRAVENOUS

## 2016-12-26 MED ORDER — MIDAZOLAM HCL 2 MG/2ML IJ SOLN
INTRAMUSCULAR | Status: AC
  Filled 2016-12-26: qty 2

## 2016-12-26 SURGICAL SUPPLY — 29 items
ADH LQ OCL WTPRF AMP STRL LF (MISCELLANEOUS) ×1
ADHESIVE MASTISOL STRL (MISCELLANEOUS) ×2 IMPLANT
CANISTER SUCT 1200ML W/VALVE (MISCELLANEOUS) ×2 IMPLANT
CHLORAPREP W/TINT 26ML (MISCELLANEOUS) ×2 IMPLANT
DRAPE LAPAROTOMY 100X77 ABD (DRAPES) ×2 IMPLANT
ELECT REM PT RETURN 9FT ADLT (ELECTROSURGICAL) ×2
ELECTRODE REM PT RTRN 9FT ADLT (ELECTROSURGICAL) ×1 IMPLANT
ETHIBOND 2 0 GREEN CT 2 30IN (SUTURE) ×4 IMPLANT
GAUZE SPONGE 4X4 12PLY STRL (GAUZE/BANDAGES/DRESSINGS) ×2 IMPLANT
GLOVE BIO SURGEON STRL SZ8 (GLOVE) ×4 IMPLANT
GOWN STRL REUS W/ TWL LRG LVL3 (GOWN DISPOSABLE) ×2 IMPLANT
GOWN STRL REUS W/TWL LRG LVL3 (GOWN DISPOSABLE) ×4
KIT RM TURNOVER STRD PROC AR (KITS) ×2 IMPLANT
LABEL OR SOLS (LABEL) ×2 IMPLANT
MESH VENTRALEX ST 1-7/10 CRC S (Mesh General) ×1 IMPLANT
NDL SAFETY 22GX1.5 (NEEDLE) ×2 IMPLANT
NS IRRIG 500ML POUR BTL (IV SOLUTION) ×2 IMPLANT
PACK BASIN MINOR ARMC (MISCELLANEOUS) ×2 IMPLANT
SPONGE LAP 18X18 5 PK (GAUZE/BANDAGES/DRESSINGS) ×2 IMPLANT
STAPLER SKIN PROX 35W (STAPLE) ×2 IMPLANT
STRIP CLOSURE SKIN 1/2X4 (GAUZE/BANDAGES/DRESSINGS) ×2 IMPLANT
SUT MNCRL 4-0 (SUTURE) ×2
SUT MNCRL 4-0 27XMFL (SUTURE) ×1
SUT PROLENE 0 CT 1 30 (SUTURE) ×6 IMPLANT
SUT VIC AB 0 CT2 27 (SUTURE) ×3 IMPLANT
SUT VIC AB 3-0 SH 27 (SUTURE) ×2
SUT VIC AB 3-0 SH 27X BRD (SUTURE) ×1 IMPLANT
SUTURE MNCRL 4-0 27XMF (SUTURE) ×1 IMPLANT
SYRINGE 10CC LL (SYRINGE) ×2 IMPLANT

## 2016-12-26 NOTE — Anesthesia Preprocedure Evaluation (Signed)
Anesthesia Evaluation  Patient identified by MRN, date of birth, ID band Patient awake    Reviewed: Allergy & Precautions, H&P , NPO status , Patient's Chart, lab work & pertinent test results, reviewed documented beta blocker date and time   Airway Mallampati: II  TM Distance: >3 FB Neck ROM: full    Dental  (+) Teeth Intact   Pulmonary neg pulmonary ROS, asthma , former smoker,    Pulmonary exam normal        Cardiovascular Exercise Tolerance: Good + angina with exertion + CAD  negative cardio ROS Normal cardiovascular exam Rhythm:regular Rate:Normal     Neuro/Psych PSYCHIATRIC DISORDERS  Neuromuscular disease negative neurological ROS  negative psych ROS   GI/Hepatic negative GI ROS, Neg liver ROS, hiatal hernia, GERD  Medicated,  Endo/Other  negative endocrine ROS  Renal/GU negative Renal ROS  negative genitourinary   Musculoskeletal   Abdominal   Peds  Hematology negative hematology ROS (+)   Anesthesia Other Findings Past Medical History: No date: Anxiety No date: Asthma     Comment: allergy induced asthma No date: Depression No date: Environmental allergies No date: Hernia of abdominal wall No date: Herpes labialis No date: History of hiatal hernia No date: Lumbar herniated disc No date: Panic attacks No date: Urinary incontinence No date: Urticaria Past Surgical History: 2001: ABDOMINAL SURGERY     Comment: MYOMECTOMY  2003: BLADDER SUSPENSION     Comment: [place] transobturator pubovaginal sling Dr               Gaynelle Arabian 12/21/2010: BREAST BIOPSY Right     Comment: [place] 11/10/2015: CHOLECYSTECTOMY N/A     Comment: Procedure: LAPAROSCOPIC CHOLECYSTECTOMY;                Surgeon: Florene Glen, MD;  Location: [facility]               ORS;  Service: General;  Laterality: N/A; No date: DILATION AND CURETTAGE OF UTERUS No date: HYSTEROSCOPY     Comment: D&C, HYST X 3 BMI    Body Mass Index:  27.46  kg/m     Reproductive/Obstetrics negative OB ROS                             Anesthesia Physical Anesthesia Plan  ASA: II  Anesthesia Plan: General LMA   Post-op Pain Management:    Induction:   Airway Management Planned:   Additional Equipment:   Intra-op Plan:   Post-operative Plan:   Informed Consent: I have reviewed the patients History and Physical, chart, labs and discussed the procedure including the risks, benefits and alternatives for the proposed anesthesia with the patient or authorized representative who has indicated his/her understanding and acceptance.   Dental Advisory Given  Plan Discussed with: CRNA  Anesthesia Plan Comments:         Anesthesia Quick Evaluation

## 2016-12-26 NOTE — Anesthesia Procedure Notes (Signed)
Procedure Name: LMA Insertion Date/Time: 12/26/2016 11:38 AM Performed by: Doreen Salvage Pre-anesthesia Checklist: Patient identified, Patient being monitored, Timeout performed, Emergency Drugs available and Suction available Patient Re-evaluated:Patient Re-evaluated prior to inductionOxygen Delivery Method: Circle system utilized Preoxygenation: Pre-oxygenation with 100% oxygen Intubation Type: IV induction Ventilation: Mask ventilation without difficulty LMA: LMA inserted LMA Size: 3.5 Tube type: Oral Number of attempts: 1 Placement Confirmation: positive ETCO2 and breath sounds checked- equal and bilateral Tube secured with: Tape Dental Injury: Teeth and Oropharynx as per pre-operative assessment

## 2016-12-26 NOTE — Transfer of Care (Signed)
Immediate Anesthesia Transfer of Care Note  Patient: Kerrianne Jeng  Procedure(s) Performed: Procedure(s): HERNIA REPAIR VENTRAL ADULT (N/A)  Patient Location: PACU  Anesthesia Type:General  Level of Consciousness: sedated  Airway & Oxygen Therapy: Patient Spontanous Breathing and Patient connected to face mask oxygen  Post-op Assessment: Report given to RN and Post -op Vital signs reviewed and stable  Post vital signs: Reviewed and stable  Last Vitals:  Vitals:   12/26/16 1030 12/26/16 1219  BP: 128/83 111/71  Pulse: 82 91  Resp: 14 13  Temp: 36.6 C 96.7 C    Complications: No apparent anesthesia complications

## 2016-12-26 NOTE — Anesthesia Post-op Follow-up Note (Cosign Needed)
Anesthesia QCDR form completed.        

## 2016-12-26 NOTE — Op Note (Signed)
Abdominal Hernia Repair  Pre-operative Diagnosis: Ventral hernia  Post-operative Diagnosis: Ventral hernia  Surgeon: Jerrol Banana. Burt Knack, MD FACS  Anesthesia: Gen. with endotracheal tube  Assistant: Surgical tech  Procedure: Ventral hernia repair with mesh  Procedure Details  The patient was seen again in the Holding Room. The benefits, complications, treatment options, and expected outcomes were discussed with the patient. The risks of bleeding, infection, recurrence of symptoms, failure to resolve symptoms, bowel injury, mesh placement, mesh infection, any of which could require further surgery were reviewed with the patient. The likelihood of improving the patient's symptoms with return to their baseline status is good.  The patient and/or family concurred with the proposed plan, giving informed consent.  The patient was taken to Operating Room, identified as Tyanne Derocher and the procedure verified.  A Time Out was held and the above information confirmed.  Prior to the induction of general anesthesia, antibiotic prophylaxis was administered. VTE prophylaxis was in place. General endotracheal anesthesia was then administered and tolerated well. After the induction, the abdomen was prepped with Chloraprep and draped in the sterile fashion. The patient was positioned in the supine position.  Local anesthetic was infiltrated into the skin and subcutaneous tissues tissues on the previously noted scar in the epigastric port site area. Dissection down to the fascia was performed in a 12 or 13 mm rent was identified the hernia sac was opened and reduced there was no bowel etc. within the sac itself. The fascial edges were cleaned and then into the preperitoneal space was placed a ventral X patch which was held in with figure-of-eight 0 Prolenes. Additional Marcaine was placed. Once assuring that hemostasis was adequate and the 4.3 cm patch had adequately repaired the hernia the wound was closed in  layers with 0 Vicryl and 3-0 Vicryl followed by 4-0 subcuticular Monocryl. Steri-Strips Mastisol and sterile dressings were placed. Patient was taken to recovery room in stable condition to be discharged care of her family and follow-up in 10 days sponge lap needle count was correct. Findings:  Ventral hernia  Estimated Blood Loss: Minimal         Drains: None         Specimens: None       Complications: none               Morelia Cassells E. Burt Knack, MD, FACS

## 2016-12-26 NOTE — Discharge Instructions (Signed)
AMBULATORY SURGERY  DISCHARGE INSTRUCTIONS   1) The drugs that you were given will stay in your system until tomorrow so for the next 24 hours you should not:  A) Drive an automobile B) Make any legal decisions C) Drink any alcoholic beverage   2) You may resume regular meals tomorrow.  Today it is better to start with liquids and gradually work up to solid foods.   You may eat anything you prefer, but it is better to start with liquids, then soup and crackers, and gradually work up to solid foods.   3) Please notify your doctor immediately if you have any unusual bleeding, trouble breathing, redness and pain at the surgery site, drainage, fever, or pain not relieved by medication.    4) Additional Instructions:   Remove dressing in 24 hours. May shower in 24 hours. Leave paper strips in place. Resume all home medications. Follow-up with Dr. Burt Knack in 10 days.        Please contact your physician with any problems or Same Day Surgery at [phone], Monday through Friday 6 am to 4 pm, or [place] at [facility] number at [phone].

## 2016-12-26 NOTE — Progress Notes (Signed)
Preoperative Review   Patient is met in the preoperative holding area. The history is reviewed in the chart and with the patient. I personally reviewed the options and rationale as well as the risks of this procedure that have been previously discussed with the patient. I reviewed specifically the risks of bleeding infection cosmetic deformity and recurrence and mesh placement with the risk of mesh infection and the alternatives with primary repair. All questions asked by the patient and/or family were answered to their satisfaction.  Patient agrees to proceed with this procedure at this time.  Florene Glen M.D. FACS

## 2016-12-27 ENCOUNTER — Telehealth: Payer: Self-pay

## 2016-12-27 NOTE — Telephone Encounter (Signed)
Patient's husband is calling again asking for a different pain medication because the one they gave her at the ED isn't working. You can call Carry at [phone]

## 2016-12-27 NOTE — Telephone Encounter (Signed)
Patient had a Ventral Hernia Repair on 12/26/16 with Dr. Burt Knack. She was discharged yesterday with some pain medication. Ccary (husband) is calling because the pain medications are not working. He sates she has been up since last night. You can call Carry at [phone]

## 2016-12-27 NOTE — Telephone Encounter (Signed)
Called patient back after talking to Dr. Adonis Huguenin in reference to patient having pain pain by just taking one Vicodin every 4 hours. Dr. Adonis Huguenin stated that the patient is able to take 2 tablets of Vicodin every 4 hours for moderate pain. I told Hannah [name] what his wife is able to do. Plus I told him that his wife was able to take Ibuprofen 800 MG every 8 hours in between the Vicodin. Mr. Henner understood.  I also told him that if she continues to have pain, to please give [place] a call.

## 2016-12-27 NOTE — Anesthesia Postprocedure Evaluation (Signed)
Anesthesia Post Note  Patient: Hannah [name]  Procedure(s) Performed: Procedure(s) (LRB): HERNIA REPAIR VENTRAL ADULT (N/A)  Patient location during evaluation: PACU Anesthesia Type: General Level of consciousness: awake and alert Pain management: pain level controlled Vital Signs Assessment: post-procedure vital signs reviewed and stable Respiratory status: spontaneous breathing, nonlabored ventilation, respiratory function stable and patient connected to nasal cannula oxygen Cardiovascular status: blood pressure returned to baseline and stable Postop Assessment: no signs of nausea or vomiting Anesthetic complications: no     Last Vitals:  Vitals:   12/26/16 1256 12/26/16 1330  BP: 126/72 133/76  Pulse: 60 61  Resp: 16 18  Temp: 36.3 C     Last Pain:  Vitals:   12/26/16 1330  TempSrc:   PainSc: 3                  Molli Barrows

## 2016-12-28 ENCOUNTER — Other Ambulatory Visit: Payer: Self-pay

## 2016-12-28 DIAGNOSIS — M545 Low back pain, unspecified: Secondary | ICD-10-CM | POA: Insufficient documentation

## 2016-12-28 DIAGNOSIS — M5126 Other intervertebral disc displacement, lumbar region: Secondary | ICD-10-CM | POA: Insufficient documentation

## 2017-01-03 ENCOUNTER — Encounter: Payer: Self-pay | Admitting: Surgery

## 2017-01-03 ENCOUNTER — Ambulatory Visit (INDEPENDENT_AMBULATORY_CARE_PROVIDER_SITE_OTHER): Payer: Managed Care, Other (non HMO) | Admitting: Surgery

## 2017-01-03 VITALS — BP 122/81 | HR 77 | Temp 97.6°F | Ht 64.0 in | Wt 161.0 lb

## 2017-01-03 DIAGNOSIS — K439 Ventral hernia without obstruction or gangrene: Secondary | ICD-10-CM

## 2017-01-03 NOTE — Progress Notes (Signed)
Outpatient postop visit  01/03/2017  Hannah [name] is an 56 y.o. female.    Procedure: Ventral hernia repair with mesh  CC: Minimal discomfort  HPI: This patient underwent a ventral hernia repair with mesh the site of the hernia was at the epigastric port site from prior laparoscopic cholecystectomy. Patient is experiencing minimal incisional discomfort but has stopped using her analgesics and in fact only used her analgesics of first 2 days.  Medications reviewed.    Physical Exam:  LMP 04/12/2012     PE: Wound clean no erythema no drainage and nontender    Assessment/Plan:  Patient doing very well status post ventral hernia repair with mesh follow-up on an as-needed basis reminded of no heavy lifting and no soaking in a hot tub or pool.  Florene Glen, MD, FACS

## 2017-01-03 NOTE — Patient Instructions (Signed)
Please call our office if you have any questions or concerns.  

## 2017-01-10 ENCOUNTER — Telehealth: Payer: Self-pay

## 2017-01-10 NOTE — Telephone Encounter (Signed)
Patient's form was filled out and the additional information that Christella Scheuermann is requesting, I went ahead and printed out for you. I am not sure of the fax number. Not sure if you have this information. This was left at your folder. Thank you.

## 2017-01-10 NOTE — Telephone Encounter (Signed)
Disability/ Medical release came in for the patient. Please fill out the disability portion and hand it back to me. I will fax it over with the medical records.

## 2017-01-11 NOTE — Telephone Encounter (Signed)
Patient's Disability information requested by her Christella Scheuermann insurance was faxed at [phone].

## 2017-01-16 ENCOUNTER — Telehealth: Payer: Self-pay

## 2017-01-16 NOTE — Telephone Encounter (Signed)
Patient's [employer] disability form was filled out and left at the front desk on your folder.

## 2017-01-16 NOTE — Telephone Encounter (Signed)
Short Term Disability came in for the patient.

## 2017-01-17 NOTE — Telephone Encounter (Signed)
Documents have been faxed with a sent confirmation. Documents have been scanned under media

## 2017-03-14 DIAGNOSIS — L501 Idiopathic urticaria: Secondary | ICD-10-CM | POA: Diagnosis not present

## 2017-04-10 DIAGNOSIS — L501 Idiopathic urticaria: Secondary | ICD-10-CM | POA: Diagnosis not present

## 2017-04-10 DIAGNOSIS — R69 Illness, unspecified: Secondary | ICD-10-CM | POA: Diagnosis not present

## 2017-05-11 DIAGNOSIS — J4530 Mild persistent asthma, uncomplicated: Secondary | ICD-10-CM | POA: Diagnosis not present

## 2017-05-11 DIAGNOSIS — R69 Illness, unspecified: Secondary | ICD-10-CM | POA: Diagnosis not present

## 2017-05-11 DIAGNOSIS — J3089 Other allergic rhinitis: Secondary | ICD-10-CM | POA: Diagnosis not present

## 2017-05-22 DIAGNOSIS — R69 Illness, unspecified: Secondary | ICD-10-CM | POA: Diagnosis not present

## 2017-05-22 DIAGNOSIS — F329 Major depressive disorder, single episode, unspecified: Secondary | ICD-10-CM | POA: Diagnosis not present

## 2017-05-22 DIAGNOSIS — F410 Panic disorder [episodic paroxysmal anxiety] without agoraphobia: Secondary | ICD-10-CM | POA: Diagnosis not present

## 2017-05-23 DIAGNOSIS — M5136 Other intervertebral disc degeneration, lumbar region: Secondary | ICD-10-CM | POA: Diagnosis not present

## 2017-06-01 DIAGNOSIS — M5416 Radiculopathy, lumbar region: Secondary | ICD-10-CM | POA: Diagnosis not present

## 2017-06-09 DIAGNOSIS — J3089 Other allergic rhinitis: Secondary | ICD-10-CM | POA: Diagnosis not present

## 2017-06-09 DIAGNOSIS — J4521 Mild intermittent asthma with (acute) exacerbation: Secondary | ICD-10-CM | POA: Diagnosis not present

## 2017-06-13 DIAGNOSIS — J4521 Mild intermittent asthma with (acute) exacerbation: Secondary | ICD-10-CM | POA: Diagnosis not present

## 2017-06-25 ENCOUNTER — Encounter: Payer: Self-pay | Admitting: Gynecology

## 2017-06-27 ENCOUNTER — Ambulatory Visit (INDEPENDENT_AMBULATORY_CARE_PROVIDER_SITE_OTHER): Payer: 59 | Admitting: Gynecology

## 2017-06-27 ENCOUNTER — Encounter: Payer: Self-pay | Admitting: Gynecology

## 2017-06-27 VITALS — BP 122/80

## 2017-06-27 DIAGNOSIS — N950 Postmenopausal bleeding: Secondary | ICD-10-CM

## 2017-06-27 NOTE — Progress Notes (Signed)
    My Rinke Dec 16, 1960 [id number]        56 y.o.  [id number] presents with a an episode of postmenopausal bleeding starting little over a week ago.  Seem to start after having a massage and started with spotting and then into a full flow and now back down to spotting.  Having some cramping.  Last menstrual period 5 years ago.  Currently on HRT to include Vivelle and Prometrium  Past medical history,surgical history, problem list, medications, allergies, family history and social history were all reviewed and documented in the EPIC chart.  Directed ROS with pertinent positives and negatives documented in the history of present illness/assessment and plan.  Exam: [name] assistant Vitals:   06/27/17 1554  BP: 122/80   General appearance:  Normal Abdomen soft nontender without masses guarding rebound Pelvic external BUS vagina with slight blood staining.  Cervix normal.  Uterus grossly normal, midline mobile nontender.  Adnexa without masses or tenderness.  Assessment/Plan:  56 y.o. [id number] with episode of postmenopausal bleeding as in a heavy menses type flow now resolving.  Discussed differential to include escape ovulation, atrophic changes, hormone replacement related, hyperplasia, structural such as polyps or myomas noting she does have a history of leiomyoma in the past and uterine cancer.  Patient will schedule a sonohysterogram for pelvic surveillance, endometrial echo assessment and allow for endometrial biopsy.    [name] MD, 4:25 PM 06/27/2017

## 2017-06-27 NOTE — Patient Instructions (Signed)
Follow-up for the ultrasound as scheduled. 

## 2017-06-28 ENCOUNTER — Other Ambulatory Visit: Payer: Self-pay | Admitting: Gynecology

## 2017-06-28 DIAGNOSIS — N950 Postmenopausal bleeding: Secondary | ICD-10-CM

## 2017-07-02 ENCOUNTER — Other Ambulatory Visit: Payer: Self-pay | Admitting: Gynecology

## 2017-07-02 MED ORDER — MEGESTROL ACETATE 20 MG PO TABS: 20.0000 mg | ORAL_TABLET | Freq: Every day | ORAL | 0 refills | Status: DC

## 2017-07-02 NOTE — Telephone Encounter (Signed)
Recommend Megace 20 mg daily through appointment which will hopefully help stop the bleeding.  Dispense #30

## 2017-07-10 DIAGNOSIS — F329 Major depressive disorder, single episode, unspecified: Secondary | ICD-10-CM | POA: Diagnosis not present

## 2017-07-10 DIAGNOSIS — Z23 Encounter for immunization: Secondary | ICD-10-CM | POA: Diagnosis not present

## 2017-07-10 DIAGNOSIS — F410 Panic disorder [episodic paroxysmal anxiety] without agoraphobia: Secondary | ICD-10-CM | POA: Diagnosis not present

## 2017-07-10 DIAGNOSIS — R69 Illness, unspecified: Secondary | ICD-10-CM | POA: Diagnosis not present

## 2017-07-19 ENCOUNTER — Encounter: Payer: Self-pay | Admitting: Gynecology

## 2017-07-19 ENCOUNTER — Ambulatory Visit (INDEPENDENT_AMBULATORY_CARE_PROVIDER_SITE_OTHER): Payer: 59

## 2017-07-19 ENCOUNTER — Ambulatory Visit (INDEPENDENT_AMBULATORY_CARE_PROVIDER_SITE_OTHER): Payer: 59 | Admitting: Gynecology

## 2017-07-19 VITALS — BP 118/76

## 2017-07-19 DIAGNOSIS — R5383 Other fatigue: Secondary | ICD-10-CM | POA: Diagnosis not present

## 2017-07-19 DIAGNOSIS — N950 Postmenopausal bleeding: Secondary | ICD-10-CM

## 2017-07-19 DIAGNOSIS — D259 Leiomyoma of uterus, unspecified: Secondary | ICD-10-CM

## 2017-07-19 NOTE — Progress Notes (Signed)
    Hannah [name] 10/25/1960 [id number]        56 y.o.  [id number] presents for sonohysterogram due to postmenopausal bleeding on HRT.  History of leiomyoma in the past.  Past medical history,surgical history, problem list, medications, allergies, family history and social history were all reviewed and documented in the EPIC chart.  Directed ROS with pertinent positives and negatives documented in the history of present illness/assessment and plan.  Exam: Pam [place] assistant Vitals:   07/19/17 1637  BP: 118/76   General appearance:  Normal Abdomen soft nontender without masses guarding rebound Pelvic external BUS vagina normal.  Cervix normal.  Uterus grossly normal size midline mobile nontender.  Adnexa without mass or tenderness.  Ultrasound transvaginal and transabdominal shows uterus anteverted, normal size with 2 small myomas measured at 28 x 19 mm and 39 x 34 x 45 mm.  Some displacement of the endometrial echo noted.  Endometrial echo 6.8 mm.  Right ovary grossly normal.  Left ovary not visualized.  No left adnexal pathology noted.  Cul-de-sac negative.  Sonohysterogram performed, sterile technique, easy catheter introduction, good distention with no abnormality seen.  Endometrial biopsy taken.  Patient tolerated well.  Assessment/Plan:  56 y.o. [id number] with postmenopausal bleeding on HRT.  Patient also notes some fatigue associated with this bleeding.  Will check baseline CBC, comprehensive metabolic panel and TSH.  Will follow-up for biopsy results and triage based upon these results.  Will monitor bleeding for now and if continues she knows to call me and we will consider changing her HRT regiment.  No evidence of endometrial pathology on sonohysterogram.     Belinda Block Shannell Mikkelsen MD, 5:16 PM 07/19/2017

## 2017-07-19 NOTE — Patient Instructions (Addendum)
Office will call you with lab test results and the biopsy results.

## 2017-07-20 LAB — CBC WITH DIFFERENTIAL/PLATELET
BASOS PCT: 0.9 %
Basophils Absolute: 86 cells/uL (ref 0–200)
EOS ABS: 192 {cells}/uL (ref 15–500)
Eosinophils Relative: 2 %
HCT: 39.9 % (ref 35.0–45.0)
Hemoglobin: 14.1 g/dL (ref 11.7–15.5)
LYMPHS ABS: 2707 {cells}/uL (ref 850–3900)
MCH: 31.8 pg (ref 27.0–33.0)
MCHC: 35.3 g/dL (ref 32.0–36.0)
MCV: 89.9 fL (ref 80.0–100.0)
MPV: 10.3 fL (ref 7.5–12.5)
Monocytes Relative: 9.5 %
Neutro Abs: 5702 cells/uL (ref 1500–7800)
Neutrophils Relative %: 59.4 %
PLATELETS: 443 10*3/uL — AB (ref 140–400)
RBC: 4.44 10*6/uL (ref 3.80–5.10)
RDW: 12.2 % (ref 11.0–15.0)
TOTAL LYMPHOCYTE: 28.2 %
WBC: 9.6 10*3/uL (ref 3.8–10.8)
WBCMIX: 912 {cells}/uL (ref 200–950)

## 2017-07-20 LAB — COMPREHENSIVE METABOLIC PANEL
AG RATIO: 1.5 (calc) (ref 1.0–2.5)
ALBUMIN MSPROF: 4.2 g/dL (ref 3.6–5.1)
ALKALINE PHOSPHATASE (APISO): 61 U/L (ref 33–130)
ALT: 11 U/L (ref 6–29)
AST: 16 U/L (ref 10–35)
BILIRUBIN TOTAL: 0.4 mg/dL (ref 0.2–1.2)
BUN: 15 mg/dL (ref 7–25)
CALCIUM: 9.3 mg/dL (ref 8.6–10.4)
CHLORIDE: 106 mmol/L (ref 98–110)
CO2: 23 mmol/L (ref 20–32)
Creat: 0.82 mg/dL (ref 0.50–1.05)
GLOBULIN: 2.8 g/dL (ref 1.9–3.7)
Glucose, Bld: 88 mg/dL (ref 65–99)
POTASSIUM: 4.6 mmol/L (ref 3.5–5.3)
Sodium: 139 mmol/L (ref 135–146)
Total Protein: 7 g/dL (ref 6.1–8.1)

## 2017-07-20 LAB — TSH: TSH: 2.28 m[IU]/L

## 2017-08-31 DIAGNOSIS — M5416 Radiculopathy, lumbar region: Secondary | ICD-10-CM | POA: Diagnosis not present

## 2017-09-18 DIAGNOSIS — M545 Low back pain: Secondary | ICD-10-CM | POA: Diagnosis not present

## 2017-09-18 DIAGNOSIS — M5416 Radiculopathy, lumbar region: Secondary | ICD-10-CM | POA: Diagnosis not present

## 2017-10-20 DIAGNOSIS — R42 Dizziness and giddiness: Secondary | ICD-10-CM | POA: Diagnosis not present

## 2017-11-06 ENCOUNTER — Other Ambulatory Visit: Payer: Self-pay | Admitting: Allergy

## 2017-11-06 MED ORDER — EPINEPHRINE 0.3 MG/0.3ML IJ SOAJ: 0.3000 mg | Freq: Once | INTRAMUSCULAR | 1 refills | Status: AC

## 2017-11-09 ENCOUNTER — Other Ambulatory Visit: Payer: Self-pay | Admitting: Gynecology

## 2017-11-09 DIAGNOSIS — Z139 Encounter for screening, unspecified: Secondary | ICD-10-CM

## 2017-11-29 ENCOUNTER — Ambulatory Visit
Admission: RE | Admit: 2017-11-29 | Discharge: 2017-11-29 | Disposition: A | Discharge status: Home / Self Care | Payer: 59 | Source: Ambulatory Visit | Attending: Gynecology | Admitting: Gynecology

## 2017-11-29 DIAGNOSIS — Z1231 Encounter for screening mammogram for malignant neoplasm of breast: Secondary | ICD-10-CM | POA: Diagnosis not present

## 2017-11-29 DIAGNOSIS — Z139 Encounter for screening, unspecified: Secondary | ICD-10-CM

## 2017-12-11 ENCOUNTER — Ambulatory Visit: Payer: 59 | Admitting: Pediatrics

## 2018-01-01 DIAGNOSIS — R69 Illness, unspecified: Secondary | ICD-10-CM | POA: Diagnosis not present

## 2018-01-03 ENCOUNTER — Other Ambulatory Visit: Payer: Self-pay | Admitting: Gynecology

## 2018-01-04 NOTE — Telephone Encounter (Signed)
Annual exam on 01/10/18

## 2018-01-10 ENCOUNTER — Ambulatory Visit (INDEPENDENT_AMBULATORY_CARE_PROVIDER_SITE_OTHER): Payer: 59 | Admitting: Gynecology

## 2018-01-10 ENCOUNTER — Encounter: Payer: Self-pay | Admitting: Gynecology

## 2018-01-10 VITALS — BP 118/76 | Ht 64.0 in | Wt 164.0 lb

## 2018-01-10 DIAGNOSIS — N952 Postmenopausal atrophic vaginitis: Secondary | ICD-10-CM

## 2018-01-10 DIAGNOSIS — Z79890 Hormone replacement therapy (postmenopausal): Secondary | ICD-10-CM

## 2018-01-10 DIAGNOSIS — Z01419 Encounter for gynecological examination (general) (routine) without abnormal findings: Secondary | ICD-10-CM | POA: Diagnosis not present

## 2018-01-10 DIAGNOSIS — Z1151 Encounter for screening for human papillomavirus (HPV): Secondary | ICD-10-CM

## 2018-01-10 DIAGNOSIS — Z13220 Encounter for screening for lipoid disorders: Secondary | ICD-10-CM

## 2018-01-10 DIAGNOSIS — R69 Illness, unspecified: Secondary | ICD-10-CM | POA: Diagnosis not present

## 2018-01-10 MED ORDER — ESTRADIOL 0.1 MG/24HR TD PTTW: 1.0000 | MEDICATED_PATCH | TRANSDERMAL | 12 refills | Status: DC

## 2018-01-10 MED ORDER — PROGESTERONE MICRONIZED 100 MG PO CAPS: ORAL_CAPSULE | ORAL | 12 refills | Status: DC

## 2018-01-10 NOTE — Addendum Note (Signed)
Addended by: Nelva Nay on: 01/10/2018 03:55 PM   Modules accepted: Orders

## 2018-01-10 NOTE — Progress Notes (Signed)
    Hannah [name] Dec 02, 1960 [id number]        56 y.o.  [id number] for annual gynecologic exam.  Doing well without complaints.  Had some issue with postmenopausal bleeding earlier but has done no bleeding since.  Past medical history,surgical history, problem list, medications, allergies, family history and social history were all reviewed and documented as reviewed in the EPIC chart.  ROS:  Performed with pertinent positives and negatives included in the history, assessment and plan.   Additional significant findings : None   Exam: [name] assistant Vitals:   01/10/18 1458  BP: 118/76  Weight: 164 lb (74.4 kg)  Height: 5\' 4"  (1.626 m)   Body mass index is 28.15 kg/m.  General appearance:  Normal affect, orientation and appearance. Skin: Grossly normal HEENT: Without gross lesions.  No cervical or supraclavicular adenopathy. Thyroid normal.  Lungs:  Clear without wheezing, rales or rhonchi Cardiac: RR, without RMG Abdominal:  Soft, nontender, without masses, guarding, rebound, organomegaly or hernia Breasts:  Examined lying and sitting without masses, retractions, discharge or axillary adenopathy. Pelvic:  Ext, BUS, Vagina: Normal with atrophic changes.  Bladder sling palpated submucosally along anterior vaginal wall  Cervix: With atrophic changes.  Pap smear/HPV  Uterus: Anteverted, normal size, shape and contour, midline and mobile nontender   Adnexa: Without masses or tenderness    Anus and perineum: Normal   Rectovaginal: Normal sphincter tone without palpated masses or tenderness.    Assessment/Plan:  57 y.o. K3T4656 female for annual gynecologic exam.   1. Postmenopausal/HRT.  On Vivelle 0.1 mg patch and Prometrium 100 mg nightly.  Doing well.  No further bleeding.  Continue to monitor and report any issues or bleeding.  With discussed the risks versus benefits of HRT including symptom relief, cardiovascular and bone health versus risks of thrombosis in the  breast cancer issue.  Refill x1 year provided. 2. Mammography 11/2017.  Continue with annual mammography when due.  Breast exam normal today. 3. Pap smear/HPV 08/2013.  Pap smear/HPV today.  No history of abnormal Pap smears.  Continue with 5-year Pap smear/HPV screening protocol per current screening guidelines. 4. Colonoscopy 2015.  Repeat at their recommended interval. 5. DEXA never.  Will plan further into the menopause at age 54.  We discussed the benefits of HRT as far as bone health. 6. Health maintenance.  Recent CBC, CMP and TSH normal.  Patient is going to return fasting for lipid profile.  Follow-up in 1 year, sooner as needed.   [name] MD, 3:44 PM 01/10/2018

## 2018-01-10 NOTE — Patient Instructions (Signed)
Follow-up for fasting lipid profile as arranged.  Follow-up in 1 year for annual exam.

## 2018-01-11 LAB — PAP IG AND HPV HIGH-RISK: HPV DNA High Risk: NOT DETECTED

## 2018-01-29 ENCOUNTER — Other Ambulatory Visit: Payer: Self-pay | Admitting: Allergy

## 2018-01-30 ENCOUNTER — Encounter: Payer: Self-pay | Admitting: Pediatrics

## 2018-02-06 ENCOUNTER — Other Ambulatory Visit: Payer: Self-pay | Admitting: Allergy

## 2018-02-06 MED ORDER — AZELASTINE HCL 0.05 % OP SOLN: 1.0000 [drp] | Freq: Two times a day (BID) | OPHTHALMIC | 5 refills | Status: DC

## 2018-02-26 ENCOUNTER — Encounter: Payer: Self-pay | Admitting: Pediatrics

## 2018-02-26 ENCOUNTER — Ambulatory Visit (INDEPENDENT_AMBULATORY_CARE_PROVIDER_SITE_OTHER): Payer: 59 | Admitting: Pediatrics

## 2018-02-26 VITALS — BP 118/88 | HR 82 | Temp 98.1°F | Resp 20

## 2018-02-26 DIAGNOSIS — J4520 Mild intermittent asthma, uncomplicated: Secondary | ICD-10-CM | POA: Diagnosis not present

## 2018-02-26 DIAGNOSIS — K219 Gastro-esophageal reflux disease without esophagitis: Secondary | ICD-10-CM | POA: Diagnosis not present

## 2018-02-26 DIAGNOSIS — J3089 Other allergic rhinitis: Secondary | ICD-10-CM | POA: Diagnosis not present

## 2018-02-26 DIAGNOSIS — J302 Other seasonal allergic rhinitis: Secondary | ICD-10-CM

## 2018-02-26 DIAGNOSIS — L501 Idiopathic urticaria: Secondary | ICD-10-CM

## 2018-02-26 MED ORDER — HYDROCORTISONE 2.5 % EX OINT: TOPICAL_OINTMENT | CUTANEOUS | 3 refills | Status: DC

## 2018-02-26 MED ORDER — MOMETASONE FUROATE 50 MCG/ACT NA SUSP: 2.0000 | Freq: Every day | NASAL | 5 refills | Status: DC | PRN

## 2018-02-26 NOTE — Patient Instructions (Addendum)
Ventolin 2 puffs every 4 hours if needed for wheezing or coughing spells Nasonex 2 sprays per nostril once a day if needed for stuffy nose Fexofenadine 180 mg once a day in the morning for itching Zyrtec 10 mg at night for runny nose or itching Ranitidine 150 mg once a day to decrease itching Pataday one drop in each eye once a day 10 minutes before using contacts Hydrocortisone 2.5% to red itchy areas twice a day as needed Call me if you're not doing better on this treatment plan  Continue to avoid stinging insects. If you have an allergic reaction take Benadryl 50 mg every 4 hours and if you have life-threatening symptoms inject with EpiPen 0.3 mg  Follow up in 6 months or sooner if needed

## 2018-02-26 NOTE — Progress Notes (Signed)
[place] [address] Dept: [phone]  FOLLOW UP NOTE  Patient ID: Hannah [name], female    DOB: 09-18-1960  Age: 57 y.o. MRN: [id number] Date of Office Visit: 02/26/2018  Assessment  Chief Complaint: Itchy Eye  HPI Hannah [name] is a 57 year old female who presents to the clinic for a follow up visit. She was last seen in this clinic on 09/18/2016 by Dr. Shaune Leeks for evaluation of asthma, chronic urticaria, and reflux. At that time, her asthma was reported as well controlled with albuterol as needed. Her chronic urticaria was well controlled with Allegra, cetirizine, and ranitidine daily.  At today's visit, she reports her asthma has been well controlled with infrequent shortness of breath and no wheezing or cough with activity or rest. She continues to use her albuterol inhaler less than once a month with relief of symptoms.   Chronic urticaria is reported as well controlled with Allegra 180 mg once a day in the morning, cetirizine 10 mg once a day at night, and ranitidine 150 mg once a day. She reports the hives start around her eyes with the right more than the left.   Allergic rhinitis is reported as well controlled with no nasal congestion or runny nose. She continues to take cetirizine 10 mg once a day and infrequently uses Flonase. She is allergic to grass, weed, tree pollen, mold, cat, and dust mite.  Reflux is reported as well controlled with ranitidine and lifestyle modifications.  Her current medications are listed in the chart.   Drug Allergies:  Allergies  Allergen Reactions  . Latex Hives, Itching and Rash    no breathing issues   . Clonazepam Other (See Comments)    Numb  . Escitalopram Swelling  . Codeine Nausea And Vomiting    SENSITIVE  . Montelukast Other (See Comments)    nightmares    Physical Exam: BP 118/88   Pulse 82   Temp 98.1 F (36.7 C) (Oral)   Resp 20   LMP 04/12/2012   SpO2 98%    Physical Exam    Constitutional: She is oriented to person, place, and time. She appears well-developed and well-nourished.  HENT:  Head: Normocephalic.  Right Ear: External ear normal.  Left Ear: External ear normal.  Mouth/Throat: Oropharynx is clear and moist.  Bilateral nares slightly erythematous and edematous with no nasal drainage noted. Pharynx normal. Ears normal. Eyes normal.  Eyes: Conjunctivae are normal.  Neck: Normal range of motion. Neck supple.  Cardiovascular: Normal rate, regular rhythm and normal heart sounds.  No murmur noted  Pulmonary/Chest: Effort normal and breath sounds normal.  Lungs clear to auscultation  Musculoskeletal: Normal range of motion.  Neurological: She is alert and oriented to person, place, and time.  Skin: Skin is warm and dry.  No rash noted in the clinic today  Psychiatric: She has a normal mood and affect. Her behavior is normal. Judgment and thought content normal.  Vitals reviewed.   Diagnostics: FVC 3.55, FEV1 2.77. Predicted FVC 3.48, predicted FEV1 2.73. Spirometry is within the normal range.   Assessment and Plan: 1. Mild intermittent asthma without complication   2. Chronic idiopathic urticaria   3. Gastroesophageal reflux disease without esophagitis   4. Seasonal and perennial allergic rhinitis   5..... Possible periorbital dermatitis by history  Meds ordered this encounter  Medications  . mometasone (NASONEX) 50 MCG/ACT nasal spray    Sig: Place 2 sprays into the nose daily as needed (for  stuffy nose).    Dispense:  17 g    Refill:  5  . hydrocortisone 2.5 % ointment    Sig: Apply to red itchy areas twice a day as needed    Dispense:  30 g    Refill:  3    Patient Instructions  Ventolin 2 puffs every 4 hours if needed for wheezing or coughing spells Nasonex 2 sprays per nostril once a day if needed for stuffy nose Fexofenadine 180 mg once a day in the morning for itching Zyrtec 10 mg at night for runny nose or itching Ranitidine  150 mg once a day to decrease itching Pataday one drop in each eye once a day 10 minutes before using contacts Hydrocortisone 2.5% to red itchy areas twice a day as needed Call me if you're not doing better on this treatment plan  Continue to avoid stinging insects. If you have an allergic reaction take Benadryl 50 mg every 4 hours and if you have life-threatening symptoms inject with EpiPen 0.3 mg  Follow up in 6 months or sooner if needed   Return in about 6 months (around 08/29/2018).   Thank you for the opportunity to care for this patient.  Please do not hesitate to contact me with questions.  Gareth Morgan, FNP [facility]  I have provided oversight concerning Gareth Morgan' evaluation and treatment of this patient's health issues addressed during today's encounter. I agree with the assessment and therapeutic plan as outlined in the note.   Thank you for the opportunity to care for this patient.  Please do not hesitate to contact me with questions.  Penne Lash, M.D.  [facility] [address] [place], [place] [address] [phone]

## 2018-03-06 DIAGNOSIS — D225 Melanocytic nevi of trunk: Secondary | ICD-10-CM | POA: Diagnosis not present

## 2018-03-06 DIAGNOSIS — L821 Other seborrheic keratosis: Secondary | ICD-10-CM | POA: Diagnosis not present

## 2018-03-06 DIAGNOSIS — B078 Other viral warts: Secondary | ICD-10-CM | POA: Diagnosis not present

## 2018-03-06 DIAGNOSIS — Z86018 Personal history of other benign neoplasm: Secondary | ICD-10-CM | POA: Diagnosis not present

## 2018-03-06 DIAGNOSIS — D485 Neoplasm of uncertain behavior of skin: Secondary | ICD-10-CM | POA: Diagnosis not present

## 2018-03-06 DIAGNOSIS — L814 Other melanin hyperpigmentation: Secondary | ICD-10-CM | POA: Diagnosis not present

## 2018-03-21 DIAGNOSIS — B9789 Other viral agents as the cause of diseases classified elsewhere: Secondary | ICD-10-CM | POA: Diagnosis not present

## 2018-03-21 DIAGNOSIS — J988 Other specified respiratory disorders: Secondary | ICD-10-CM | POA: Diagnosis not present

## 2018-03-21 DIAGNOSIS — J4521 Mild intermittent asthma with (acute) exacerbation: Secondary | ICD-10-CM | POA: Diagnosis not present

## 2018-04-23 DIAGNOSIS — R69 Illness, unspecified: Secondary | ICD-10-CM | POA: Diagnosis not present

## 2018-04-23 DIAGNOSIS — J4530 Mild persistent asthma, uncomplicated: Secondary | ICD-10-CM | POA: Diagnosis not present

## 2018-04-23 DIAGNOSIS — E663 Overweight: Secondary | ICD-10-CM | POA: Diagnosis not present

## 2018-06-13 DIAGNOSIS — Z23 Encounter for immunization: Secondary | ICD-10-CM | POA: Diagnosis not present

## 2018-07-16 DIAGNOSIS — J9801 Acute bronchospasm: Secondary | ICD-10-CM | POA: Diagnosis not present

## 2018-07-23 DIAGNOSIS — R69 Illness, unspecified: Secondary | ICD-10-CM | POA: Diagnosis not present

## 2018-08-05 DIAGNOSIS — J029 Acute pharyngitis, unspecified: Secondary | ICD-10-CM | POA: Diagnosis not present

## 2018-08-05 DIAGNOSIS — J069 Acute upper respiratory infection, unspecified: Secondary | ICD-10-CM | POA: Diagnosis not present

## 2018-08-06 ENCOUNTER — Other Ambulatory Visit: Payer: Self-pay

## 2018-08-06 ENCOUNTER — Ambulatory Visit
Admission: EM | Admit: 2018-08-06 | Discharge: 2018-08-06 | Disposition: A | Discharge status: Home / Self Care | Payer: 59 | Attending: Family Medicine | Admitting: Family Medicine

## 2018-08-06 DIAGNOSIS — R0602 Shortness of breath: Secondary | ICD-10-CM

## 2018-08-06 DIAGNOSIS — J4520 Mild intermittent asthma, uncomplicated: Secondary | ICD-10-CM

## 2018-08-06 DIAGNOSIS — J4521 Mild intermittent asthma with (acute) exacerbation: Secondary | ICD-10-CM

## 2018-08-06 DIAGNOSIS — B9789 Other viral agents as the cause of diseases classified elsewhere: Secondary | ICD-10-CM | POA: Diagnosis not present

## 2018-08-06 DIAGNOSIS — Z87891 Personal history of nicotine dependence: Secondary | ICD-10-CM

## 2018-08-06 DIAGNOSIS — J069 Acute upper respiratory infection, unspecified: Secondary | ICD-10-CM

## 2018-08-06 DIAGNOSIS — R05 Cough: Secondary | ICD-10-CM | POA: Diagnosis not present

## 2018-08-06 DIAGNOSIS — R0981 Nasal congestion: Secondary | ICD-10-CM

## 2018-08-06 MED ORDER — IPRATROPIUM-ALBUTEROL 0.5-2.5 (3) MG/3ML IN SOLN
3.0000 mL | Freq: Once | RESPIRATORY_TRACT | Status: AC
  Administered 2018-08-06: 3 mL via RESPIRATORY_TRACT

## 2018-08-06 MED ORDER — ALBUTEROL SULFATE HFA 108 (90 BASE) MCG/ACT IN AERS: 2.0000 | INHALATION_SPRAY | RESPIRATORY_TRACT | 0 refills | Status: DC | PRN

## 2018-08-06 MED ORDER — PREDNISONE 20 MG PO TABS: 40.0000 mg | ORAL_TABLET | Freq: Every day | ORAL | 0 refills | Status: DC

## 2018-08-06 NOTE — ED Triage Notes (Signed)
Patient complains of cough, shortness of breath x Friday. Patient states that was seen by [facility] yesterday and diagnosed with URI. States that she is an asthma patient and feels like her breathing worsened last night. States that she has used her nebulizer and inhalers with out much relief.

## 2018-08-06 NOTE — ED Provider Notes (Signed)
[facility] ____________________________________________  Time seen: Approximately 9:43 AM  I have reviewed the triage vital signs and the nursing notes.   HISTORY  Chief Complaint Cough   HPI Hannah [name] is a 57 y.o. female presenting with husband at bedside for evaluation of cough and congestion complaints present since Friday.  Reports symptoms worsened on Sunday with hot and cold chills, subjective fever.  But denies any lasting fevers.  States husband recently sick with similar complaints.  Patient reports seen in urgent care yesterday and diagnosed with upper respiratory infection.  Reports however in the last 2 days she has been having intermittent tightness in her chest with associated shortness of breath consistent with her previous previous asthma exacerbations.  States current tightness as well.  Has had intermittent wheezing with her cough.  States that last night she used albuterol inhaler and nebulizer solution with minimal improvement.  Denies chest pain.  Scratchy throat with hoarse voice.  Overall continues to eat and drink well.  States cough is occasionally productive, often dry.  Denies hemoptysis, immobilization or recent injury.  Also taking over-the-counter Mucinex, and states that she had some diarrhea.  No abdominal pain or vomiting.  Denies other relieving factors.  Reports otherwise doing well.  Barbaraann Boys, MD: PCP   Past Medical History:  Diagnosis Date  . Anxiety   . Asthma    allergy induced asthma  . Depression   . Environmental allergies   . Hernia of abdominal wall   . Herpes labialis   . History of hiatal hernia   . Lumbar herniated disc   . Panic attacks   . Urinary incontinence   . Urticaria     Patient Active Problem List   Diagnosis Date Noted  . Seasonal and perennial allergic rhinitis 02/26/2018  . Displacement of lumbar intervertebral disc without myelopathy 12/28/2016  . Low back pain 12/28/2016  . Ventral  incisional hernia without gangrene   . Viral pharyngitis 05/05/2016  . Sore throat 05/04/2016  . Strep throat 04/19/2016  . Chronic urticaria 01/17/2016  . Acute cholecystitis 11/10/2015  . Cholecystitis   . History of colon polyps 10/15/2015  . Chronic anxiety 10/05/2015  . Diaphragmatic hernia 10/05/2015  . Insomnia 10/05/2015  . 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 Prinzmetal angina ([place]) 10/05/2015  . Recurrent major depressive disorder ([place]) 10/05/2015  . Chronic idiopathic urticaria 07/12/2015  . Mild intermittent asthma without complication 61/44/3154  . Detrusor muscle hypertonia 06/02/2015  . Vitamin D deficiency 06/02/2015  . Mild persistent asthma 05/17/2015  . Other allergic rhinitis 05/17/2015  . Idiopathic urticaria 05/17/2015  . GERD (gastroesophageal reflux disease) 05/17/2015  . Gastroesophageal reflux disease without esophagitis 05/17/2015    Past Surgical History:  Procedure Laterality Date  . ABDOMINAL SURGERY  2001   MYOMECTOMY   . BLADDER SUSPENSION  2003   [place] transobturator pubovaginal sling Dr Gaynelle Arabian  . BREAST BIOPSY Right 12/21/2010   [place]  . CHOLECYSTECTOMY N/A 11/10/2015   Procedure: LAPAROSCOPIC CHOLECYSTECTOMY;  Surgeon: Florene Glen, MD;  Location: [facility] ORS;  Service: General;  Laterality: N/A;  . DILATION AND CURETTAGE OF UTERUS    . HYSTEROSCOPY     D&C, HYST X 3  . VENTRAL HERNIA REPAIR N/A 12/26/2016   Procedure: HERNIA REPAIR VENTRAL ADULT;  Surgeon: Florene Glen, MD;  Location: [facility] ORS;  Service: General;  Laterality: N/A;     No current facility-administered medications for this encounter.   Current Outpatient Medications:  .  albuterol (PROVENTIL HFA;VENTOLIN HFA) 108 (90 Base) MCG/ACT inhaler, Two puffs every 4 hours if needed for wheezing or coughing spells, Disp: 8 g, Rfl: 1 .  ALPRAZolam (XANAX) 1 MG tablet, Take 0.5-1 mg by mouth 3 (three) times daily as needed for anxiety. , Disp: , Rfl:  .   azelastine (OPTIVAR) 0.05 % ophthalmic solution, Place 1 drop into both eyes 2 (two) times daily., Disp: 6 mL, Rfl: 5 .  CALCIUM PO, Take 1 tablet by mouth daily., Disp: , Rfl:  .  cetirizine (ZYRTEC) 10 MG tablet, Take 10 mg by mouth at bedtime. , Disp: , Rfl:  .  Cholecalciferol (VITAMIN D3) 3000 units TABS, Take 3,000 Units by mouth daily., Disp: , Rfl:  .  diphenhydrAMINE (BENADRYL) 12.5 MG/5ML liquid, Take 25 mg by mouth daily as needed for allergies., Disp: , Rfl:  .  EPINEPHrine (EPIPEN 2-PAK) 0.3 mg/0.3 mL IJ SOAJ injection, Use as directed for severe allergic reaction., Disp: 2 Device, Rfl: 2 .  estradiol (VIVELLE-DOT) 0.1 MG/24HR patch, Place 1 patch (0.1 mg total) onto the skin 2 (two) times a week., Disp: 8 patch, Rfl: 12 .  fexofenadine (ALLEGRA) 180 MG tablet, Take 180 mg by mouth daily., Disp: , Rfl:  .  fluticasone (FLONASE) 50 MCG/ACT nasal spray, Place 1 spray into both nostrils daily as needed for allergies or rhinitis., Disp: , Rfl:  .  Multiple Vitamin (MULTI-VITAMINS) TABS, Take 1 tablet by mouth daily. , Disp: , Rfl:  .  nitroGLYCERIN (NITROSTAT) 0.3 MG SL tablet, Place 0.3 mg under the tongue every 5 (five) minutes as needed for chest pain. Reported on 08/04/2015, Disp: , Rfl:  .  Olopatadine HCl (PATADAY) 0.2 % SOLN, One drop in each eye once a day 10 minutes before using contacts., Disp: 2.5 mL, Rfl: 5 .  progesterone (PROMETRIUM) 100 MG capsule, TAKE 1 CAPSULE(100 MG) BY MOUTH AT BEDTIME, Disp: 30 capsule, Rfl: 12 .  ranitidine (ZANTAC) 150 MG tablet, Take 150 mg by mouth daily., Disp: , Rfl:  .  albuterol (PROVENTIL HFA;VENTOLIN HFA) 108 (90 Base) MCG/ACT inhaler, Inhale 2 puffs into the lungs every 4 (four) hours as needed for wheezing., Disp: 1 Inhaler, Rfl: 0 .  predniSONE (DELTASONE) 20 MG tablet, Take 2 tablets (40 mg total) by mouth daily. Take 2 tablets (40 mg) orally daily for 3 days, then 1 tablet (20 mg) orally for 3 days., Disp: 9 tablet, Rfl: 0 .  valACYclovir  (VALTREX) 1000 MG tablet, valacyclovir 1 gram tablet, Disp: , Rfl:   Allergies Latex; Clonazepam; Escitalopram; Codeine; and Montelukast  Family History  Problem Relation Age of Onset  . Cancer Maternal Uncle        2 MATERNAL UNCLES W COLON CA  . Heart disease Maternal Grandmother   . Hypertension Maternal Grandmother   . Cancer Maternal Grandfather        Lung  . Cancer Paternal Grandfather        Lung  . Myasthenia gravis Father   . Alzheimer's disease Father   . Allergic rhinitis Neg Hx   . Angioedema Neg Hx   . Asthma Neg Hx   . Immunodeficiency Neg Hx   . Eczema Neg Hx   . Urticaria Neg Hx     Social History Social History   Tobacco Use  . Smoking status: Former Smoker    Packs/day: 1.00    Types: Cigarettes    Last attempt to quit: 08/13/2012    Years since quitting: 5.9  .  Smokeless tobacco: Never Used  Substance Use Topics  . Alcohol use: Yes    Comment: Wine daily  . Drug use: No    Review of Systems Constitutional: Subjective fevers. ENT: as above.  Cardiovascular: Denies chest pain. Respiratory: as above.  Gastrointestinal: No abdominal pain.  Musculoskeletal: Negative for back pain. Skin: Negative for rash.   ____________________________________________   PHYSICAL EXAM:  VITAL SIGNS: ED Triage Vitals  Enc Vitals Group     BP 08/06/18 0922 108/80     Pulse Rate 08/06/18 0922 85     Resp 08/06/18 0922 18     Temp 08/06/18 0922 97.9 F (36.6 C)     Temp Source 08/06/18 0922 Oral     SpO2 08/06/18 0922 100 %     Weight 08/06/18 0918 160 lb (72.6 kg)     Height 08/06/18 0918 5\' 4"  (1.626 m)     Head Circumference --      Peak Flow --      Pain Score 08/06/18 0918 0     Pain Loc --      Pain Edu? --      Excl. in [place]? --     Constitutional: Alert and oriented. Well appearing and in no acute distress. Eyes: Conjunctivae are normal. Head: Atraumatic. No sinus tenderness to palpation. No swelling. No erythema.  Ears: no erythema,  normal TMs bilaterally.   Nose:Nasal congestion   Mouth/Throat: Mucous membranes are moist. No pharyngeal erythema. No tonsillar swelling or exudate.  Neck: No stridor.  No cervical spine tenderness to palpation. Hematological/Lymphatic/Immunilogical: No cervical lymphadenopathy. Cardiovascular: Normal rate, regular rhythm. Grossly normal heart sounds.  Good peripheral circulation. Respiratory: Normal respiratory effort.  No retractions.  Mild scattered inspiratory expiratory wheezes.  No rhonchi.  Good air movement.  Dry intermittent cough with bronchospasm.  Speaks in complete sentences. Musculoskeletal: Ambulatory with steady gait Neurologic:  Normal speech and language. No gait instability. Skin:  Skin appears warm, dry and intact. No rash noted. Psychiatric: Mood and affect are normal. Speech and behavior are normal. ___________________________________________   LABS (all labs ordered are listed, but only abnormal results are displayed)  Labs Reviewed - No data to display  PROCEDURES Procedures    INITIAL IMPRESSION / ASSESSMENT AND PLAN / ED COURSE  Pertinent labs & imaging results that were available during my care of the patient were reviewed by me and considered in my medical decision making (see chart for details).  Well-appearing patient.  No acute distress.  Suspect upper respiratory viral illness with accompanying asthma exacerbation.  Albuterol DuoNeb given once in urgent care, reevaluate afterwards, lungs clear throughout and wheezes resolved.  Patient states shortness of breath also resolved, and reports feeling better.  Continue over-the-counter supportive care, will Rx prednisone taper and refill albuterol inhaler.  Encouraged rest, fluids, supportive care.Discussed indication, risks and benefits of medications with patient.  Discussed follow up with Primary care physician this week. Discussed follow up and return parameters including no resolution or any worsening  concerns. Patient verbalized understanding and agreed to plan.   ____________________________________________   FINAL CLINICAL IMPRESSION(S) / ED DIAGNOSES  Final diagnoses:  Viral URI with cough  Exacerbation of intermittent asthma, unspecified asthma severity     ED Discharge Orders         Ordered    predniSONE (DELTASONE) 20 MG tablet  Daily     08/06/18 0946    albuterol (PROVENTIL HFA;VENTOLIN HFA) 108 (90 Base) MCG/ACT inhaler  Every 4 hours PRN  08/06/18 0946           Note: This dictation was prepared with Dragon dictation along with smaller phrase technology. Any transcriptional errors that result from this process are unintentional.         Marylene Land, NP 08/06/18 1024

## 2018-08-06 NOTE — Discharge Instructions (Addendum)
Take medication as prescribed. Rest. Drink plenty of fluids.  ° °Follow up with your primary care physician this week as needed. Return to Urgent care for new or worsening concerns.  ° °

## 2018-08-08 ENCOUNTER — Ambulatory Visit
Admission: EM | Admit: 2018-08-08 | Discharge: 2018-08-08 | Disposition: A | Discharge status: Home / Self Care | Payer: 59 | Attending: Family Medicine | Admitting: Family Medicine

## 2018-08-08 ENCOUNTER — Other Ambulatory Visit: Payer: Self-pay

## 2018-08-08 ENCOUNTER — Encounter: Payer: Self-pay | Admitting: Emergency Medicine

## 2018-08-08 DIAGNOSIS — Z87891 Personal history of nicotine dependence: Secondary | ICD-10-CM | POA: Diagnosis not present

## 2018-08-08 DIAGNOSIS — J4531 Mild persistent asthma with (acute) exacerbation: Secondary | ICD-10-CM

## 2018-08-08 MED ORDER — PREDNISONE 20 MG PO TABS: ORAL_TABLET | ORAL | 0 refills | Status: DC

## 2018-08-08 MED ORDER — IPRATROPIUM-ALBUTEROL 0.5-2.5 (3) MG/3ML IN SOLN: 3.0000 mL | Freq: Four times a day (QID) | RESPIRATORY_TRACT | 0 refills | Status: DC | PRN

## 2018-08-08 MED ORDER — IPRATROPIUM-ALBUTEROL 0.5-2.5 (3) MG/3ML IN SOLN
3.0000 mL | Freq: Once | RESPIRATORY_TRACT | Status: AC
  Administered 2018-08-08: 3 mL via RESPIRATORY_TRACT

## 2018-08-08 MED ORDER — HYDROCOD POLST-CPM POLST ER 10-8 MG/5ML PO SUER: 5.0000 mL | Freq: Every evening | ORAL | 0 refills | Status: DC | PRN

## 2018-08-08 NOTE — ED Provider Notes (Signed)
[facility]    CSN: [id number] Arrival date & time: 08/08/18  0809     History   Chief Complaint Chief Complaint  Patient presents with  . Cough  . Shortness of Breath    HPI Klara Lameshia Hypolite is a 57 y.o. female.   The history is provided by the patient.  URI  Presenting symptoms: cough   Severity:  Moderate Duration:  7 days Timing:  Constant Progression:  Unchanged Chronicity:  New Relieved by:  Prescription medications Associated symptoms: wheezing   Risk factors: chronic respiratory disease     Past Medical History:  Diagnosis Date  . Anxiety   . Asthma    allergy induced asthma  . Depression   . Environmental allergies   . Hernia of abdominal wall   . Herpes labialis   . History of hiatal hernia   . Lumbar herniated disc   . Panic attacks   . Urinary incontinence   . Urticaria     Patient Active Problem List   Diagnosis Date Noted  . Seasonal and perennial allergic rhinitis 02/26/2018  . Displacement of lumbar intervertebral disc without myelopathy 12/28/2016  . Low back pain 12/28/2016  . Ventral incisional hernia without gangrene   . Viral pharyngitis 05/05/2016  . Sore throat 05/04/2016  . Strep throat 04/19/2016  . Chronic urticaria 01/17/2016  . Acute cholecystitis 11/10/2015  . Cholecystitis   . History of colon polyps 10/15/2015  . Chronic anxiety 10/05/2015  . Diaphragmatic hernia 10/05/2015  . Insomnia 10/05/2015  . 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 Prinzmetal angina ([place]) 10/05/2015  . Recurrent major depressive disorder ([place]) 10/05/2015  . Chronic idiopathic urticaria 07/12/2015  . Mild intermittent asthma without complication 31/54/0086  . Detrusor muscle hypertonia 06/02/2015  . Vitamin D deficiency 06/02/2015  . Mild persistent asthma 05/17/2015  . Other allergic rhinitis 05/17/2015  . Idiopathic urticaria 05/17/2015  . GERD (gastroesophageal reflux disease) 05/17/2015  .  Gastroesophageal reflux disease without esophagitis 05/17/2015    Past Surgical History:  Procedure Laterality Date  . ABDOMINAL SURGERY  2001   MYOMECTOMY   . BLADDER SUSPENSION  2003   [place]entor transobturator pubovaginal sling Dr Gaynelle Arabian  . BREAST BIOPSY Right 12/21/2010   [place]  . CHOLECYSTECTOMY N/A 11/10/2015   Procedure: LAPAROSCOPIC CHOLECYSTECTOMY;  Surgeon: Florene Glen, MD;  Location: [facility] ORS;  Service: General;  Laterality: N/A;  . DILATION AND CURETTAGE OF UTERUS    . HYSTEROSCOPY     D&C, HYST X 3  . VENTRAL HERNIA REPAIR N/A 12/26/2016   Procedure: HERNIA REPAIR VENTRAL ADULT;  Surgeon: Florene Glen, MD;  Location: [facility] ORS;  Service: General;  Laterality: N/A;    OB History    Gravida  3   Para  1   Term  1   Preterm      AB  2   Living  1     SAB  2   TAB      Ectopic      Multiple      Live Births  1            Home Medications    Prior to Admission medications   Medication Sig Start Date End Date Taking? Authorizing Provider  albuterol (PROVENTIL HFA;VENTOLIN HFA) 108 (90 Base) MCG/ACT inhaler Two puffs every 4 hours if needed for wheezing or coughing spells 12/19/16  Yes Bardelas, Jens Som, MD  albuterol (PROVENTIL HFA;VENTOLIN HFA) 108 (90 Base)  MCG/ACT inhaler Inhale 2 puffs into the lungs every 4 (four) hours as needed for wheezing. 08/06/18  Yes Marylene Land, NP  ALPRAZolam Duanne Moron) 1 MG tablet Take 0.5-1 mg by mouth 3 (three) times daily as needed for anxiety.    Yes [provider]  azelastine (OPTIVAR) 0.05 % ophthalmic solution Place 1 drop into both eyes 2 (two) times daily. 02/06/18  Yes Bardelas, Jose A, MD  CALCIUM PO Take 1 tablet by mouth daily.   Yes [provider]  cetirizine (ZYRTEC) 10 MG tablet Take 10 mg by mouth at bedtime.    Yes [provider]  Cholecalciferol (VITAMIN D3) 3000 units TABS Take 3,000 Units by mouth daily.   Yes [provider]  diphenhydrAMINE (BENADRYL) 12.5  MG/5ML liquid Take 25 mg by mouth daily as needed for allergies.   Yes [provider]  EPINEPHrine (EPIPEN 2-PAK) 0.3 mg/0.3 mL IJ SOAJ injection Use as directed for severe allergic reaction. 09/18/16  Yes Bardelas, Jens Som, MD  estradiol (VIVELLE-DOT) 0.1 MG/24HR patch Place 1 patch (0.1 mg total) onto the skin 2 (two) times a week. 01/10/18  Yes Fontaine, Belinda Block, MD  fexofenadine (ALLEGRA) 180 MG tablet Take 180 mg by mouth daily.   Yes [provider]  fluticasone (FLONASE) 50 MCG/ACT nasal spray Place 1 spray into both nostrils daily as needed for allergies or rhinitis.   Yes [provider]  Multiple Vitamin (MULTI-VITAMINS) TABS Take 1 tablet by mouth daily.    Yes [provider]  nitroGLYCERIN (NITROSTAT) 0.3 MG SL tablet Place 0.3 mg under the tongue every 5 (five) minutes as needed for chest pain. Reported on 08/04/2015   Yes [provider]  Olopatadine HCl (PATADAY) 0.2 % SOLN One drop in each eye once a day 10 minutes before using contacts. 09/18/16  Yes Bardelas, Jens Som, MD  progesterone (PROMETRIUM) 100 MG capsule TAKE 1 CAPSULE(100 MG) BY MOUTH AT BEDTIME 01/10/18  Yes Fontaine, Belinda Block, MD  ranitidine (ZANTAC) 150 MG tablet Take 150 mg by mouth daily.   Yes [provider]  valACYclovir (VALTREX) 1000 MG tablet valacyclovir 1 gram tablet   Yes [provider]  chlorpheniramine-HYDROcodone (TUSSIONEX PENNKINETIC ER) 10-8 MG/5ML SUER Take 5 mLs by mouth at bedtime as needed. 08/08/18   Norval Gable, MD  ipratropium-albuterol (DUONEB) 0.5-2.5 (3) MG/3ML SOLN Take 3 mLs by nebulization every 6 (six) hours as needed. 08/08/18   Norval Gable, MD  predniSONE (DELTASONE) 20 MG tablet Take 2 tabs po qd x 2 days, then 1 tab po qd x 2 days, then half a tab po qd x 2 days 08/08/18   Norval Gable, MD    Family History Family History  Problem Relation Age of Onset  . Cancer Maternal Uncle        2 MATERNAL UNCLES W COLON CA    . Heart disease Maternal Grandmother   . Hypertension Maternal Grandmother   . Cancer Maternal Grandfather        Lung  . Cancer Paternal Grandfather        Lung  . Myasthenia gravis Father   . Alzheimer's disease Father   . Allergic rhinitis Neg Hx   . Angioedema Neg Hx   . Asthma Neg Hx   . Immunodeficiency Neg Hx   . Eczema Neg Hx   . Urticaria Neg Hx     Social History Social History   Tobacco Use  . Smoking status: Former Smoker  Packs/day: 1.00    Types: Cigarettes    Last attempt to quit: 08/13/2012    Years since quitting: 5.9  . Smokeless tobacco: Never Used  Substance Use Topics  . Alcohol use: Yes    Comment: Wine daily  . Drug use: No     Allergies   Latex; Clonazepam; Escitalopram; Codeine; and Montelukast   Review of Systems Review of Systems  Respiratory: Positive for cough and wheezing.      Physical Exam Triage Vital Signs ED Triage Vitals  Enc Vitals Group     BP 08/08/18 0824 131/83     Pulse Rate 08/08/18 0824 79     Resp 08/08/18 0824 18     Temp 08/08/18 0824 97.8 F (36.6 C)     Temp Source 08/08/18 0824 Oral     SpO2 08/08/18 0824 100 %     Weight 08/08/18 0821 160 lb (72.6 kg)     Height 08/08/18 0821 5\' 4"  (1.626 m)     Head Circumference --      Peak Flow --      Pain Score 08/08/18 0821 0     Pain Loc --      Pain Edu? --      Excl. in [place]? --    No data found.  Updated Vital Signs BP 131/83 (BP Location: Left Arm)   Pulse 79   Temp 97.8 F (36.6 C) (Oral)   Resp 18   Ht 5\' 4"  (1.626 m)   Wt 72.6 kg   LMP 04/12/2012   SpO2 100%   BMI 27.46 kg/m   Visual Acuity Right Eye Distance:   Left Eye Distance:   Bilateral Distance:    Right Eye Near:   Left Eye Near:    Bilateral Near:     Physical Exam Vitals signs and nursing note reviewed.  Constitutional:      General: She is not in acute distress.    Appearance: She is not toxic-appearing or diaphoretic.  Cardiovascular:     Rate and Rhythm: Normal  rate and regular rhythm.     Heart sounds: Normal heart sounds.  Pulmonary:     Effort: Pulmonary effort is normal. No respiratory distress.     Breath sounds: No stridor. Wheezing (few, expiratory) present. No rhonchi or rales.  Neurological:     Mental Status: She is alert.      UC Treatments / Results  Labs (all labs ordered are listed, but only abnormal results are displayed) Labs Reviewed - No data to display  EKG None  Radiology No results found.  Procedures Procedures (including critical care time)  Medications Ordered in UC Medications  ipratropium-albuterol (DUONEB) 0.5-2.5 (3) MG/3ML nebulizer solution 3 mL (3 mLs Nebulization Given 08/08/18 0846)    Initial Impression / Assessment and Plan / UC Course  I have reviewed the triage vital signs and the nursing notes.  Pertinent labs & imaging results that were available during my care of the patient were reviewed by me and considered in my medical decision making (see chart for details).      Final Clinical Impressions(s) / UC Diagnoses   Final diagnoses:  Mild persistent asthma with exacerbation    ED Prescriptions    Medication Sig Dispense Auth. Provider   ipratropium-albuterol (DUONEB) 0.5-2.5 (3) MG/3ML SOLN Take 3 mLs by nebulization every 6 (six) hours as needed. 90 mL Norval Gable, MD   predniSONE (DELTASONE) 20 MG tablet Take 2 tabs po qd x 2  days, then 1 tab po qd x 2 days, then half a tab po qd x 2 days 7 tablet Norval Gable, MD   chlorpheniramine-HYDROcodone (TUSSIONEX PENNKINETIC ER) 10-8 MG/5ML SUER Take 5 mLs by mouth at bedtime as needed. 60 mL Norval Gable, MD      1. diagnosis reviewed with patient 2. rx as per orders above; reviewed possible side effects, interactions, risks and benefits  3. Recommend supportive treatment with rest, fluids 4. Follow-up prn if symptoms worsen or don't improve  Controlled Substance Prescriptions [place] Controlled Substance Registry consulted? Not  Applicable   Norval Gable, MD 08/08/18 1258

## 2018-08-08 NOTE — ED Triage Notes (Signed)
Pt c/o shortness of breath, cough, and wheezing. She was seen on 08/06/18. She was given a Duoneb treatment and felt better. She thinks she needs another breathing treatment. She is taking prednisone currently. Pt has h/o asthma.

## 2018-08-09 ENCOUNTER — Telehealth: Payer: Self-pay

## 2018-08-09 NOTE — Telephone Encounter (Signed)
Please call patient and ask her to come in today. It sounds like she may need a stronger inhaler than albuterol.  Thank you.

## 2018-08-09 NOTE — Telephone Encounter (Signed)
Patient declined office visit. She informed me that she received pred from UC and hopefully she is on the mend.

## 2018-08-09 NOTE — Telephone Encounter (Signed)
Hello Dr Shaune Leeks.     I've been to Urgent Care twice this week for an upper respiratory infection and subsequent asthma and wheezing. Yesterday I was a breathing treatment and a prescription for prednisone but only a small prescription. I am flying to [place] on Saturday for 7 days and want to be sure that I have some with me. Can you send in a prescription for prednisone to [facility] in [place] so that I have some on hand?     Thank you! I will be seeing you for my 6 month check up soon I think.     [name], Hannah [name]   [phone]    Please advise as Dr Shaune Leeks is out of office

## 2018-08-11 ENCOUNTER — Encounter: Payer: Self-pay | Admitting: Emergency Medicine

## 2018-08-11 ENCOUNTER — Other Ambulatory Visit: Payer: Self-pay

## 2018-08-11 ENCOUNTER — Ambulatory Visit
Admission: EM | Admit: 2018-08-11 | Discharge: 2018-08-11 | Disposition: A | Discharge status: Home / Self Care | Payer: 59 | Attending: Emergency Medicine | Admitting: Emergency Medicine

## 2018-08-11 DIAGNOSIS — J029 Acute pharyngitis, unspecified: Secondary | ICD-10-CM | POA: Insufficient documentation

## 2018-08-11 DIAGNOSIS — B9789 Other viral agents as the cause of diseases classified elsewhere: Secondary | ICD-10-CM | POA: Insufficient documentation

## 2018-08-11 DIAGNOSIS — J069 Acute upper respiratory infection, unspecified: Secondary | ICD-10-CM | POA: Insufficient documentation

## 2018-08-11 LAB — RAPID STREP SCREEN (MED CTR MEBANE ONLY): Streptococcus, Group A Screen (Direct): NEGATIVE

## 2018-08-11 NOTE — ED Provider Notes (Signed)
[facility] ____________________________________________  Time seen: Approximately 2:50 PM  I have reviewed the triage vital signs and the nursing notes.   HISTORY  Chief Complaint Sore Throat (appointment)   HPI Hannah [name] is a 57 y.o. female presenting for reevaluation of cough and wheezing complaints.  Patient has extensive history of allergies and asthma.  Patient was seen twice earlier this week for complaints consistent with her asthma, and suspected triggered by viral upper respiratory infection.  Patient states that she is doing much better.  Has been using her prednisone, albuterol treatments as directed as well as continued her regular allergy therapy.  Has used some over-the-counter cough medication, has not taken her Tussionex that was also prescribed at last visit.  Patient reports she is no longer having any tightness or shortness of breath and feeling better.  States she is here today as she does still have some mild left-sided sore throat and she wanted to make sure she did not have strep throat.  Patient is due to travel tomorrow to visit her elderly mother.  No fevers.  Has continued to overall eat and drink well.  Denies chest pain.  Denies other aggravating or alleviating factors.  Reports otherwise doing well.  Barbaraann Boys, MD: PCP     Past Medical History:  Diagnosis Date  . Anxiety   . Asthma    allergy induced asthma  . Depression   . Environmental allergies   . Hernia of abdominal wall   . Herpes labialis   . History of hiatal hernia   . Lumbar herniated disc   . Panic attacks   . Urinary incontinence   . Urticaria     Patient Active Problem List   Diagnosis Date Noted  . Seasonal and perennial allergic rhinitis 02/26/2018  . Displacement of lumbar intervertebral disc without myelopathy 12/28/2016  . Low back pain 12/28/2016  . Ventral incisional hernia without gangrene   . Viral pharyngitis 05/05/2016  . Sore throat  05/04/2016  . Strep throat 04/19/2016  . Chronic urticaria 01/17/2016  . Acute cholecystitis 11/10/2015  . Cholecystitis   . History of colon polyps 10/15/2015  . Chronic anxiety 10/05/2015  . Diaphragmatic hernia 10/05/2015  . Insomnia 10/05/2015  . 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 Prinzmetal angina ([place]) 10/05/2015  . Recurrent major depressive disorder ([place]) 10/05/2015  . Chronic idiopathic urticaria 07/12/2015  . Mild intermittent asthma without complication 42/70/6237  . Detrusor muscle hypertonia 06/02/2015  . Vitamin D deficiency 06/02/2015  . Mild persistent asthma 05/17/2015  . Other allergic rhinitis 05/17/2015  . Idiopathic urticaria 05/17/2015  . GERD (gastroesophageal reflux disease) 05/17/2015  . Gastroesophageal reflux disease without esophagitis 05/17/2015    Past Surgical History:  Procedure Laterality Date  . ABDOMINAL SURGERY  2001   MYOMECTOMY   . BLADDER SUSPENSION  2003   [place]entor transobturator pubovaginal sling Dr Gaynelle Arabian  . BREAST BIOPSY Right 12/21/2010   [place]  . CHOLECYSTECTOMY N/A 11/10/2015   Procedure: LAPAROSCOPIC CHOLECYSTECTOMY;  Surgeon: Florene Glen, MD;  Location: [facility] OR[facility];  Service: General;  Laterality: N/A;  . DILATION AND CURETTAGE OF UTERUS    . HYSTEROSCOPY     D&C, HYST X 3  . VENTRAL HERNIA REPAIR N/A 12/26/2016   Procedure: HERNIA REPAIR VENTRAL ADULT;  Surgeon: Florene Glen, MD;  Location: [facility];  Service: General;  Laterality: N/A;     No current facility-administered medications for this encounter.   Current Outpatient Medications:  .  albuterol (PROVENTIL HFA;VENTOLIN HFA) 108 (90 Base) MCG/ACT inhaler, Inhale 2 puffs into the lungs every 4 (four) hours as needed for wheezing., Disp: 1 Inhaler, Rfl: 0 .  ALPRAZolam (XANAX) 1 MG tablet, Take 0.5-1 mg by mouth 3 (three) times daily as needed for anxiety. , Disp: , Rfl:  .  azelastine (OPTIVAR) 0.05 % ophthalmic solution, Place 1 drop into  both eyes 2 (two) times daily., Disp: 6 mL, Rfl: 5 .  CALCIUM PO, Take 1 tablet by mouth daily., Disp: , Rfl:  .  cetirizine (ZYRTEC) 10 MG tablet, Take 10 mg by mouth at bedtime. , Disp: , Rfl:  .  chlorpheniramine-HYDROcodone (TUSSIONEX PENNKINETIC ER) 10-8 MG/5ML SUER, Take 5 mLs by mouth at bedtime as needed., Disp: 60 mL, Rfl: 0 .  Cholecalciferol (VITAMIN D3) 3000 units TABS, Take 3,000 Units by mouth daily., Disp: , Rfl:  .  diphenhydrAMINE (BENADRYL) 12.5 MG/5ML liquid, Take 25 mg by mouth daily as needed for allergies., Disp: , Rfl:  .  EPINEPHrine (EPIPEN 2-PAK) 0.3 mg/0.3 mL IJ SOAJ injection, Use as directed for severe allergic reaction., Disp: 2 Device, Rfl: 2 .  estradiol (VIVELLE-DOT) 0.1 MG/24HR patch, Place 1 patch (0.1 mg total) onto the skin 2 (two) times a week., Disp: 8 patch, Rfl: 12 .  fexofenadine (ALLEGRA) 180 MG tablet, Take 180 mg by mouth daily., Disp: , Rfl:  .  fluticasone (FLONASE) 50 MCG/ACT nasal spray, Place 1 spray into both nostrils daily as needed for allergies or rhinitis., Disp: , Rfl:  .  ipratropium-albuterol (DUONEB) 0.5-2.5 (3) MG/3ML SOLN, Take 3 mLs by nebulization every 6 (six) hours as needed., Disp: 90 mL, Rfl: 0 .  Multiple Vitamin (MULTI-VITAMINS) TABS, Take 1 tablet by mouth daily. , Disp: , Rfl:  .  nitroGLYCERIN (NITROSTAT) 0.3 MG SL tablet, Place 0.3 mg under the tongue every 5 (five) minutes as needed for chest pain. Reported on 08/04/2015, Disp: , Rfl:  .  Olopatadine HCl (PATADAY) 0.2 % SOLN, One drop in each eye once a day 10 minutes before using contacts., Disp: 2.5 mL, Rfl: 5 .  predniSONE (DELTASONE) 20 MG tablet, Take 2 tabs po qd x 2 days, then 1 tab po qd x 2 days, then half a tab po qd x 2 days, Disp: 7 tablet, Rfl: 0 .  progesterone (PROMETRIUM) 100 MG capsule, TAKE 1 CAPSULE(100 MG) BY MOUTH AT BEDTIME, Disp: 30 capsule, Rfl: 12 .  ranitidine (ZANTAC) 150 MG tablet, Take 150 mg by mouth daily., Disp: , Rfl:  .  valACYclovir (VALTREX)  1000 MG tablet, valacyclovir 1 gram tablet, Disp: , Rfl:  .  albuterol (PROVENTIL HFA;VENTOLIN HFA) 108 (90 Base) MCG/ACT inhaler, Two puffs every 4 hours if needed for wheezing or coughing spells, Disp: 8 g, Rfl: 1  Allergies Latex; Clonazepam; Escitalopram; Codeine; and Montelukast  Family History  Problem Relation Age of Onset  . Cancer Maternal Uncle        2 MATERNAL UNCLES W COLON CA  . Heart disease Maternal Grandmother   . Hypertension Maternal Grandmother   . Cancer Maternal Grandfather        Lung  . Cancer Paternal Grandfather        Lung  . Myasthenia gravis Father   . Alzheimer's disease Father   . Allergic rhinitis Neg Hx   . Angioedema Neg Hx   . Asthma Neg Hx   . Immunodeficiency Neg Hx   . Eczema Neg Hx   . Urticaria Neg Hx  Social History Social History   Tobacco Use  . Smoking status: Former Smoker    Packs/day: 1.00    Types: Cigarettes    Last attempt to quit: 08/13/2012    Years since quitting: 5.9  . Smokeless tobacco: Never Used  Substance Use Topics  . Alcohol use: Yes    Comment: Wine daily  . Drug use: No    Review of Systems Constitutional: No fever. ENT: As above. Cardiovascular: Denies chest pain. Respiratory: Denies shortness of breath. Gastrointestinal: No abdominal pain.   Musculoskeletal: Negative for back pain. Skin: Negative for rash.   ____________________________________________   PHYSICAL EXAM:  VITAL SIGNS: ED Triage Vitals  Enc Vitals Group     BP 08/11/18 1356 102/88     Pulse Rate 08/11/18 1356 81     Resp 08/11/18 1356 18     Temp 08/11/18 1356 98.1 F (36.7 C)     Temp Source 08/11/18 1356 Oral     SpO2 08/11/18 1356 100 %     Weight 08/11/18 1357 160 lb (72.6 kg)     Height 08/11/18 1357 5\' 4"  (1.626 m)     Head Circumference --      Peak Flow --      Pain Score 08/11/18 1440 0     Pain Loc --      Pain Edu? --      Excl. in [place]? --     Constitutional: Alert and oriented. Well appearing and in  no acute distress. Eyes: Conjunctivae are normal. Head: Atraumatic. No sinus tenderness to palpation. No swelling. No erythema.  Ears: no erythema, normal TMs bilaterally.   Nose:Mild nasal congestion  Mouth/Throat: Mucous membranes are moist.minimal pharyngeal erythema. No tonsillar swelling or exudate.  Neck: No stridor.  No cervical spine tenderness to palpation. Hematological/Lymphatic/Immunilogical: No cervical lymphadenopathy. Cardiovascular: Normal rate, regular rhythm. Grossly normal heart sounds.  Good peripheral circulation. Respiratory: Normal respiratory effort.  No retractions. No wheezes, rales or rhonchi. Good air movement.  Occasional cough noted in room without bronchospasm. Musculoskeletal: Ambulatory with steady gait. Neurologic:  Normal speech and language. No gait instability. Skin:  Skin appears warm, dry and intact. No rash noted. Psychiatric: Mood and affect are normal. Speech and behavior are normal. ___________________________________________   LABS (all labs ordered are listed, but only abnormal results are displayed)  Labs Reviewed  RAPID STREP SCREEN ([facility] ONLY)  CULTURE, GROUP A STREP [facility])     PROCEDURES Procedures    INITIAL IMPRESSION / ASSESSMENT AND PLAN / ED COURSE  Pertinent labs & imaging results that were available during my care of the patient were reviewed by me and considered in my medical decision making (see chart for details).  Well-appearing patient.  No acute distress.  Lungs clear throughout.  Quick strep negative, will culture.  Minimal pharyngeal erythema.  Suspect pharyngitis due to cough irritation as well as recent viral illness.  Continue supportive care and allergy and asthma treatment.  Discussed follow up with Primary care physician this week as needed. Discussed follow up and return parameters including no resolution or any worsening concerns. Patient verbalized understanding and agreed to plan.    ____________________________________________   FINAL CLINICAL IMPRESSION(S) / ED DIAGNOSES  Final diagnoses:  Pharyngitis, unspecified etiology  Viral URI with cough     ED Discharge Orders    None       Note: This dictation was prepared with Dragon dictation along with smaller phrase technology. Any transcriptional errors that result  from this process are unintentional.         Marylene Land, NP 08/11/18 1755

## 2018-08-11 NOTE — ED Triage Notes (Signed)
Patient states this is her 3rd visit this week. She states she is traveling on a plane tomorrow and wanted someone to look at her throat. She states she is feeling better with the cough and shortness of breath but has continued to have a sore throat.

## 2018-08-11 NOTE — Discharge Instructions (Addendum)
Continue medication as prescribed. Rest. Drink plenty of fluids.   Follow up with your primary care physician this week as needed. Return to Urgent care for new or worsening concerns.

## 2018-08-14 LAB — CULTURE, GROUP A STREP (THRC)

## 2018-09-11 DIAGNOSIS — B078 Other viral warts: Secondary | ICD-10-CM | POA: Diagnosis not present

## 2018-09-11 DIAGNOSIS — Z23 Encounter for immunization: Secondary | ICD-10-CM | POA: Diagnosis not present

## 2018-09-19 DIAGNOSIS — Z885 Allergy status to narcotic agent status: Secondary | ICD-10-CM | POA: Diagnosis not present

## 2018-09-19 DIAGNOSIS — R0989 Other specified symptoms and signs involving the circulatory and respiratory systems: Secondary | ICD-10-CM | POA: Diagnosis not present

## 2018-09-19 DIAGNOSIS — T17208A Unspecified foreign body in pharynx causing other injury, initial encounter: Secondary | ICD-10-CM | POA: Diagnosis not present

## 2018-09-19 DIAGNOSIS — R69 Illness, unspecified: Secondary | ICD-10-CM | POA: Diagnosis not present

## 2018-09-19 DIAGNOSIS — Z91040 Latex allergy status: Secondary | ICD-10-CM | POA: Diagnosis not present

## 2018-10-11 DIAGNOSIS — R69 Illness, unspecified: Secondary | ICD-10-CM | POA: Diagnosis not present

## 2018-10-11 DIAGNOSIS — R0989 Other specified symptoms and signs involving the circulatory and respiratory systems: Secondary | ICD-10-CM | POA: Diagnosis not present

## 2018-10-11 DIAGNOSIS — L501 Idiopathic urticaria: Secondary | ICD-10-CM | POA: Diagnosis not present

## 2018-10-14 DIAGNOSIS — Z885 Allergy status to narcotic agent status: Secondary | ICD-10-CM | POA: Diagnosis not present

## 2018-10-14 DIAGNOSIS — Z87891 Personal history of nicotine dependence: Secondary | ICD-10-CM | POA: Diagnosis not present

## 2018-10-14 DIAGNOSIS — Z91040 Latex allergy status: Secondary | ICD-10-CM | POA: Diagnosis not present

## 2018-10-14 DIAGNOSIS — R69 Illness, unspecified: Secondary | ICD-10-CM | POA: Diagnosis not present

## 2018-10-14 DIAGNOSIS — J45909 Unspecified asthma, uncomplicated: Secondary | ICD-10-CM | POA: Diagnosis not present

## 2018-10-14 DIAGNOSIS — E278 Other specified disorders of adrenal gland: Secondary | ICD-10-CM | POA: Diagnosis not present

## 2018-10-14 DIAGNOSIS — Z888 Allergy status to other drugs, medicaments and biological substances status: Secondary | ICD-10-CM | POA: Diagnosis not present

## 2018-10-14 DIAGNOSIS — Z9049 Acquired absence of other specified parts of digestive tract: Secondary | ICD-10-CM | POA: Diagnosis not present

## 2018-10-14 DIAGNOSIS — R0602 Shortness of breath: Secondary | ICD-10-CM | POA: Diagnosis not present

## 2018-10-14 DIAGNOSIS — Z79899 Other long term (current) drug therapy: Secondary | ICD-10-CM | POA: Diagnosis not present

## 2018-10-14 DIAGNOSIS — R0789 Other chest pain: Secondary | ICD-10-CM | POA: Diagnosis not present

## 2018-10-14 DIAGNOSIS — I499 Cardiac arrhythmia, unspecified: Secondary | ICD-10-CM | POA: Diagnosis not present

## 2018-10-24 DIAGNOSIS — R1310 Dysphagia, unspecified: Secondary | ICD-10-CM | POA: Diagnosis not present

## 2018-10-25 ENCOUNTER — Other Ambulatory Visit: Payer: Self-pay

## 2018-10-25 ENCOUNTER — Ambulatory Visit
Admission: EM | Admit: 2018-10-25 | Discharge: 2018-10-25 | Disposition: A | Discharge status: Home / Self Care | Payer: 59 | Attending: Family Medicine | Admitting: Family Medicine

## 2018-10-25 ENCOUNTER — Telehealth: Payer: Self-pay | Admitting: Pediatrics

## 2018-10-25 ENCOUNTER — Encounter: Payer: Self-pay | Admitting: Emergency Medicine

## 2018-10-25 ENCOUNTER — Telehealth: Payer: Self-pay | Admitting: Family Medicine

## 2018-10-25 DIAGNOSIS — J45901 Unspecified asthma with (acute) exacerbation: Secondary | ICD-10-CM | POA: Diagnosis not present

## 2018-10-25 DIAGNOSIS — Z87891 Personal history of nicotine dependence: Secondary | ICD-10-CM | POA: Diagnosis not present

## 2018-10-25 MED ORDER — METHYLPREDNISOLONE SODIUM SUCC 40 MG IJ SOLR
80.0000 mg | Freq: Once | INTRAMUSCULAR | Status: AC
  Administered 2018-10-25: 80 mg via INTRAMUSCULAR

## 2018-10-25 MED ORDER — ALBUTEROL SULFATE HFA 108 (90 BASE) MCG/ACT IN AERS: 2.0000 | INHALATION_SPRAY | RESPIRATORY_TRACT | 0 refills | Status: DC | PRN

## 2018-10-25 MED ORDER — PREDNISONE 50 MG PO TABS: ORAL_TABLET | ORAL | 0 refills | Status: DC

## 2018-10-25 NOTE — Telephone Encounter (Signed)
Forgot to send Rx. Rx sent.

## 2018-10-25 NOTE — Telephone Encounter (Signed)
Spoke with husband pt was seen in July 2019 so sent in refill for ventolin

## 2018-10-25 NOTE — ED Provider Notes (Signed)
[facility]    CSN: [id number] Arrival date & time: 10/25/18  1612  History   Chief Complaint Chief Complaint  Patient presents with  . Shortness of Breath    APPT   HPI  58 year old female presents with chest tightness and shortness of breath.  Started Monday.  Worse last night.  She reports chest tightness &  shortness of breath.  Also reports congestion. She has used her albuterol inhaler without improvement.  She has had improvement with DuoNeb.  No documented fever.  Patient feels that she is having an asthma exacerbation.  Patient believes that this is been exacerbated by weather change.  No other associated symptoms.  No other complaints.  PMH, Surgical Hx, Family Hx, Social History reviewed and updated as below.  Past Medical History:  Diagnosis Date  . Anxiety   . Asthma    allergy induced asthma  . Depression   . Environmental allergies   . Hernia of abdominal wall   . Herpes labialis   . History of hiatal hernia   . Lumbar herniated disc   . Panic attacks   . Urinary incontinence   . Urticaria     Patient Active Problem List   Diagnosis Date Noted  . Seasonal and perennial allergic rhinitis 02/26/2018  . Displacement of lumbar intervertebral disc without myelopathy 12/28/2016  . Low back pain 12/28/2016  . Ventral incisional hernia without gangrene   . Viral pharyngitis 05/05/2016  . Sore throat 05/04/2016  . Strep throat 04/19/2016  . Chronic urticaria 01/17/2016  . Acute cholecystitis 11/10/2015  . Cholecystitis   . History of colon polyps 10/15/2015  . Chronic anxiety 10/05/2015  . Diaphragmatic hernia 10/05/2015  . Insomnia 10/05/2015  . 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 Prinzmetal angina ([place]) 10/05/2015  . Recurrent major depressive disorder ([place]) 10/05/2015  . Chronic idiopathic urticaria 07/12/2015  . Mild intermittent asthma without complication 57/84/6962  . Detrusor muscle hypertonia 06/02/2015  .  Vitamin D deficiency 06/02/2015  . Mild persistent asthma 05/17/2015  . Other allergic rhinitis 05/17/2015  . Idiopathic urticaria 05/17/2015  . GERD (gastroesophageal reflux disease) 05/17/2015  . Gastroesophageal reflux disease without esophagitis 05/17/2015    Past Surgical History:  Procedure Laterality Date  . ABDOMINAL SURGERY  2001   MYOMECTOMY   . BLADDER SUSPENSION  2003   [place] transobturator pubovaginal sling Dr Gaynelle Arabian  . BREAST BIOPSY Right 12/21/2010   [place]  . CHOLECYSTECTOMY N/A 11/10/2015   Procedure: LAPAROSCOPIC CHOLECYSTECTOMY;  Surgeon: Florene Glen, MD;  Location: [facility] ORS;  Service: General;  Laterality: N/A;  . DILATION AND CURETTAGE OF UTERUS    . HYSTEROSCOPY     D&C, HYST X 3  . VENTRAL HERNIA REPAIR N/A 12/26/2016   Procedure: HERNIA REPAIR VENTRAL ADULT;  Surgeon: Florene Glen, MD;  Location: [facility] ORS;  Service: General;  Laterality: N/A;    OB History    Gravida  3   Para  1   Term  1   Preterm      AB  2   Living  1     SAB  2   TAB      Ectopic      Multiple      Live Births  1            Home Medications    Prior to Admission medications   Medication Sig Start Date End Date Taking? Authorizing Provider  albuterol (PROVENTIL HFA;VENTOLIN HFA)  108 (90 Base) MCG/ACT inhaler Two puffs every 4 hours if needed for wheezing or coughing spells 12/19/16  Yes Bardelas, Jose A, MD  ALPRAZolam (XANAX) 1 MG tablet Take 0.5-1 mg by mouth 3 (three) times daily as needed for anxiety.    Yes [provider]  cetirizine (ZYRTEC) 10 MG tablet Take 10 mg by mouth at bedtime.    Yes [provider]  Cholecalciferol (VITAMIN D3) 3000 units TABS Take 3,000 Units by mouth daily.   Yes [provider]  EPINEPHrine (EPIPEN 2-PAK) 0.3 mg/0.3 mL IJ SOAJ injection Use as directed for severe allergic reaction. 09/18/16  Yes Bardelas, Jens Som, MD  estradiol (VIVELLE-DOT) 0.1 MG/24HR patch Place 1 patch (0.1 mg total) onto  the skin 2 (two) times a week. 01/10/18  Yes Fontaine, Belinda Block, MD  fexofenadine (ALLEGRA) 180 MG tablet Take 180 mg by mouth daily.   Yes [provider]  ipratropium-albuterol (DUONEB) 0.5-2.5 (3) MG/3ML SOLN Take 3 mLs by nebulization every 6 (six) hours as needed. 08/08/18  Yes Conty, [place], MD  mometasone (NASONEX) 50 MCG/ACT nasal spray mometasone 50 mcg/actuation nasal spray  U 2 SPRAYS IEN ONCE A DAY FOR NASAL CONGESTION OR DRAINAGE   Yes [provider]  ranitidine (ZANTAC) 150 MG tablet Take 150 mg by mouth daily.   Yes [provider]  valACYclovir (VALTREX) 1000 MG tablet valacyclovir 1 gram tablet   Yes [provider]  nitroGLYCERIN (NITROSTAT) 0.3 MG SL tablet Place 0.3 mg under the tongue every 5 (five) minutes as needed for chest pain. Reported on 08/04/2015    [provider]  progesterone (PROMETRIUM) 100 MG capsule TAKE 1 CAPSULE(100 MG) BY MOUTH AT BEDTIME 01/10/18   Fontaine, Belinda Block, MD    Family History Family History  Problem Relation Age of Onset  . Cancer Maternal Uncle        2 MATERNAL UNCLES W COLON CA  . Heart disease Maternal Grandmother   . Hypertension Maternal Grandmother   . Cancer Maternal Grandfather        Lung  . Cancer Paternal Grandfather        Lung  . Myasthenia gravis Father   . Alzheimer's disease Father   . Healthy Mother   . Allergic rhinitis Neg Hx   . Angioedema Neg Hx   . Asthma Neg Hx   . Immunodeficiency Neg Hx   . Eczema Neg Hx   . Urticaria Neg Hx     Social History Social History   Tobacco Use  . Smoking status: Former Smoker    Packs/day: 1.00    Types: Cigarettes    Last attempt to quit: 08/13/2012    Years since quitting: 6.2  . Smokeless tobacco: Never Used  Substance Use Topics  . Alcohol use: Yes    Comment: Wine daily  . Drug use: No     Allergies   Latex; Clonazepam; Escitalopram; Codeine; and Montelukast   Review of Systems Review of Systems   Constitutional: Negative for fever.  Respiratory: Positive for chest tightness and shortness of breath.    Physical Exam Triage Vital Signs ED Triage Vitals  Enc Vitals Group     BP 10/25/18 1626 102/74     Pulse Rate 10/25/18 1626 86     Resp 10/25/18 1626 16     Temp 10/25/18 1626 97.7 F (36.5 C)     Temp Source 10/25/18 1626 Oral     SpO2 10/25/18 1626 100 %  Weight 10/25/18 1629 160 lb (72.6 kg)     Height 10/25/18 1629 5\' 4"  (1.626 m)     Head Circumference --      Peak Flow --      Pain Score 10/25/18 1628 3     Pain Loc --      Pain Edu? --      Excl. in [place]? --    Updated Vital Signs BP 102/74 (BP Location: Left Arm)   Pulse 86   Temp 97.7 F (36.5 C) (Oral)   Resp 16   Ht 5\' 4"  (1.626 m)   Wt 72.6 kg   LMP 04/12/2012   SpO2 100%   BMI 27.46 kg/m   Visual Acuity Right Eye Distance:   Left Eye Distance:   Bilateral Distance:    Right Eye Near:   Left Eye Near:    Bilateral Near:     Physical Exam Vitals signs and nursing note reviewed.  Constitutional:      General: She is not in acute distress.    Appearance: She is well-developed.  HENT:     Head: Normocephalic and atraumatic.     Nose: Nose normal.     Mouth/Throat:     Pharynx: Oropharynx is clear. No oropharyngeal exudate.  Eyes:     General:        Right eye: No discharge.        Left eye: No discharge.     Conjunctiva/sclera: Conjunctivae normal.  Cardiovascular:     Rate and Rhythm: Normal rate and regular rhythm.  Pulmonary:     Effort: Pulmonary effort is normal.     Breath sounds: Normal breath sounds.  Neurological:     Mental Status: She is alert.  Psychiatric:        Mood and Affect: Mood normal.        Behavior: Behavior normal.    UC Treatments / Results  Labs (all labs ordered are listed, but only abnormal results are displayed) Labs Reviewed - No data to display  EKG None  Radiology No results found.  Procedures Procedures (including critical care time)   Medications Ordered in UC Medications  methylPREDNISolone sodium succinate (SOLU-MEDROL) 40 mg/mL injection 80 mg (80 mg Intramuscular Given 10/25/18 1710)    Initial Impression / Assessment and Plan / UC Course  I have reviewed the triage vital signs and the nursing notes.  Pertinent labs & imaging results that were available during my care of the patient were reviewed by me and considered in my medical decision making (see chart for details).    58 year old female presents with an asthma exacerbation.  IM Solu-Medrol given today.  Sending home on prednisone.  Continued use of DuoNeb.  Supportive care.  Final Clinical Impressions(s) / UC Diagnoses   Final diagnoses:  Exacerbation of asthma, unspecified asthma severity, unspecified whether persistent     Discharge Instructions     Start prednisone tomorrow.  Duoneb every 6 hours over the next 2 days.  Take care  Dr. Lacinda Axon    ED Prescriptions    None     Controlled Substance Prescriptions [place] Controlled Substance Registry consulted? Not Applicable   Coral Spikes, DO 10/25/18 1801

## 2018-10-25 NOTE — Discharge Instructions (Signed)
Start prednisone tomorrow.  Duoneb every 6 hours over the next 2 days.  Take care  Dr. Lacinda Axon

## 2018-10-25 NOTE — ED Triage Notes (Signed)
Patient in today c/o sob since last night. Patient has a history of asthma. Patient has used her albuterol inhaler and Duoneb in her nebulizer.

## 2018-10-25 NOTE — Telephone Encounter (Signed)
Husband called in to get Ventolin inhaler refill for PT. Advise original rx date was 12/2016 so PT may need to come in for an OV. There is another ventolin order from 07/2018 from different provider. Please advise husband if refill can be made without OV.

## 2018-10-29 ENCOUNTER — Other Ambulatory Visit: Payer: Self-pay

## 2018-10-29 ENCOUNTER — Encounter: Payer: Self-pay | Admitting: Emergency Medicine

## 2018-10-29 ENCOUNTER — Ambulatory Visit
Admission: EM | Admit: 2018-10-29 | Discharge: 2018-10-29 | Disposition: A | Discharge status: Home / Self Care | Payer: 59 | Attending: Family Medicine | Admitting: Family Medicine

## 2018-10-29 DIAGNOSIS — J45901 Unspecified asthma with (acute) exacerbation: Secondary | ICD-10-CM | POA: Diagnosis not present

## 2018-10-29 MED ORDER — PREDNISONE 10 MG PO TABS: ORAL_TABLET | ORAL | 0 refills | Status: DC

## 2018-10-29 MED ORDER — IPRATROPIUM-ALBUTEROL 0.5-2.5 (3) MG/3ML IN SOLN: 3.0000 mL | Freq: Four times a day (QID) | RESPIRATORY_TRACT | 3 refills | Status: DC | PRN

## 2018-10-29 NOTE — Discharge Instructions (Signed)
Continue Duoneb.  Prednisone as prescribed (longer course).   Take care  Dr. Lacinda Axon

## 2018-10-29 NOTE — ED Triage Notes (Signed)
Patient in today with continued sob from asthma. Patient seen 10/25/18 at [facility] and treated. Patient states she did have some improvement in her symptoms, but they worsened yesterday. Patient denies fever.

## 2018-10-29 NOTE — ED Provider Notes (Signed)
[facility]    CSN: [id number] Arrival date & time: 10/29/18  0805  History   Chief Complaint Chief Complaint  Patient presents with  . Shortness of Breath  . Asthma   HPI  58 year old female presents with persistent shortness of breath.  Patient has asthma.  She was seen by me on 3/13.  Was treated with Solu-Medrol and placed on a prednisone course.  Advised to use her home DuoNeb regularly.  Patient states that she felt like she was improving but then worsened yesterday.  She believes that this was exacerbated by talking so much on the phone for her job.  She endorses shortness of breath.  She is also had difficulty taking her medication due to ongoing issues with dysphasia which she sees GI.  No other reported symptoms.  No other complaints at this time.  PMH, Surgical Hx, Family Hx, Social History reviewed and updated as below.  Past Medical History:  Diagnosis Date  . Anxiety   . Asthma    allergy induced asthma  . Depression   . Environmental allergies   . Hernia of abdominal wall   . Herpes labialis   . History of hiatal hernia   . Lumbar herniated disc   . Panic attacks   . Urinary incontinence   . Urticaria     Patient Active Problem List   Diagnosis Date Noted  . Seasonal and perennial allergic rhinitis 02/26/2018  . Displacement of lumbar intervertebral disc without myelopathy 12/28/2016  . Low back pain 12/28/2016  . Ventral incisional hernia without gangrene   . Viral pharyngitis 05/05/2016  . Sore throat 05/04/2016  . Strep throat 04/19/2016  . Chronic urticaria 01/17/2016  . Acute cholecystitis 11/10/2015  . Cholecystitis   . History of colon polyps 10/15/2015  . Chronic anxiety 10/05/2015  . Diaphragmatic hernia 10/05/2015  . Insomnia 10/05/2015  . 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 Prinzmetal angina ([place]) 10/05/2015  . Recurrent major depressive disorder ([place]) 10/05/2015  . Chronic idiopathic urticaria  07/12/2015  . Mild intermittent asthma without complication 45/80/9983  . Detrusor muscle hypertonia 06/02/2015  . Vitamin D deficiency 06/02/2015  . Mild persistent asthma 05/17/2015  . Other allergic rhinitis 05/17/2015  . Idiopathic urticaria 05/17/2015  . GERD (gastroesophageal reflux disease) 05/17/2015  . Gastroesophageal reflux disease without esophagitis 05/17/2015    Past Surgical History:  Procedure Laterality Date  . ABDOMINAL SURGERY  2001   MYOMECTOMY   . BLADDER SUSPENSION  2003   [place] transobturator pubovaginal sling Dr Gaynelle Arabian  . BREAST BIOPSY Right 12/21/2010   [place]  . CHOLECYSTECTOMY N/A 11/10/2015   Procedure: LAPAROSCOPIC CHOLECYSTECTOMY;  Surgeon: Florene Glen, MD;  Location: [facility] ORS;  Service: General;  Laterality: N/A;  . DILATION AND CURETTAGE OF UTERUS    . HYSTEROSCOPY     D&C, HYST X 3  . VENTRAL HERNIA REPAIR N/A 12/26/2016   Procedure: HERNIA REPAIR VENTRAL ADULT;  Surgeon: Florene Glen, MD;  Location: [facility] ORS;  Service: General;  Laterality: N/A;    OB History    Gravida  3   Para  1   Term  1   Preterm      AB  2   Living  1     SAB  2   TAB      Ectopic      Multiple      Live Births  1  Home Medications    Prior to Admission medications   Medication Sig Start Date End Date Taking? Authorizing Provider  albuterol (PROVENTIL HFA;VENTOLIN HFA) 108 (90 Base) MCG/ACT inhaler Two puffs every 4 hours if needed for wheezing or coughing spells 12/19/16  Yes Bardelas, Jose A, MD  ALPRAZolam (XANAX) 1 MG tablet Take 0.5-1 mg by mouth 3 (three) times daily as needed for anxiety.    Yes [provider]  cetirizine (ZYRTEC) 10 MG tablet Take 10 mg by mouth at bedtime.    Yes [provider]  Cholecalciferol (VITAMIN D3) 3000 units TABS Take 3,000 Units by mouth daily.   Yes [provider]  EPINEPHrine (EPIPEN 2-PAK) 0.3 mg/0.3 mL IJ SOAJ injection Use as directed for severe allergic  reaction. 09/18/16  Yes Bardelas, Jens Som, MD  estradiol (VIVELLE-DOT) 0.1 MG/24HR patch Place 1 patch (0.1 mg total) onto the skin 2 (two) times a week. 01/10/18  Yes Fontaine, Belinda Block, MD  fexofenadine (ALLEGRA) 180 MG tablet Take 180 mg by mouth daily.   Yes [provider]  nitroGLYCERIN (NITROSTAT) 0.3 MG SL tablet Place 0.3 mg under the tongue every 5 (five) minutes as needed for chest pain. Reported on 08/04/2015   Yes [provider]  progesterone (PROMETRIUM) 100 MG capsule TAKE 1 CAPSULE(100 MG) BY MOUTH AT BEDTIME 01/10/18  Yes Fontaine, Belinda Block, MD  ranitidine (ZANTAC) 150 MG tablet Take 150 mg by mouth daily.   Yes [provider]  valACYclovir (VALTREX) 1000 MG tablet valacyclovir 1 gram tablet   Yes [provider]  ipratropium-albuterol (DUONEB) 0.5-2.5 (3) MG/3ML SOLN Take 3 mLs by nebulization every 6 (six) hours as needed. 10/29/18   Babs Dabbs G, DO  mometasone (NASONEX) 50 MCG/ACT nasal spray mometasone 50 mcg/actuation nasal spray  U 2 SPRAYS IEN ONCE A DAY FOR NASAL CONGESTION OR DRAINAGE    [provider]  predniSONE (DELTASONE) 10 MG tablet 50 mg daily x 2 days, then 40 mg daily x 2 days, then 30 mg daily x 2 days, then 20 mg daily x 2 days, then 10 mg daily x 2 days. 10/29/18   Coral Spikes, DO    Family History Family History  Problem Relation Age of Onset  . Cancer Maternal Uncle        2 MATERNAL UNCLES W COLON CA  . Heart disease Maternal Grandmother   . Hypertension Maternal Grandmother   . Cancer Maternal Grandfather        Lung  . Cancer Paternal Grandfather        Lung  . Myasthenia gravis Father   . Alzheimer's disease Father   . Healthy Mother   . Allergic rhinitis Neg Hx   . Angioedema Neg Hx   . Asthma Neg Hx   . Immunodeficiency Neg Hx   . Eczema Neg Hx   . Urticaria Neg Hx     Social History Social History   Tobacco Use  . Smoking status: Former Smoker    Packs/day: 1.00    Types: Cigarettes     Last attempt to quit: 08/13/2012    Years since quitting: 6.2  . Smokeless tobacco: Never Used  Substance Use Topics  . Alcohol use: Yes    Comment: Wine daily  . Drug use: No     Allergies   Latex; Clonazepam; Escitalopram; Codeine; and Montelukast   Review of Systems Review of Systems  Constitutional: Negative for fever.  Respiratory: Positive for shortness of breath.  Physical Exam Triage Vital Signs ED Triage Vitals  Enc Vitals Group     BP 10/29/18 0826 134/83     Pulse Rate 10/29/18 0826 98     Resp 10/29/18 0826 18     Temp 10/29/18 0826 97.9 F (36.6 C)     Temp Source 10/29/18 0826 Oral     SpO2 10/29/18 0826 100 %     Weight 10/29/18 0827 160 lb (72.6 kg)     Height 10/29/18 0827 5\' 4"  (1.626 m)     Head Circumference --      Peak Flow --      Pain Score 10/29/18 0826 0     Pain Loc --      Pain Edu? --      Excl. in [place]? --    Updated Vital Signs BP 134/83 (BP Location: Left Arm)   Pulse 98   Temp 97.9 F (36.6 C) (Oral)   Resp 18   Ht 5\' 4"  (1.626 m)   Wt 72.6 kg   LMP 04/12/2012   SpO2 100%   BMI 27.46 kg/m   Visual Acuity Right Eye Distance:   Left Eye Distance:   Bilateral Distance:    Right Eye Near:   Left Eye Near:    Bilateral Near:     Physical Exam Vitals signs and nursing note reviewed.  Constitutional:      General: She is not in acute distress.    Appearance: She is well-developed.  HENT:     Head: Normocephalic and atraumatic.     Nose: Nose normal.  Eyes:     General:        Right eye: No discharge.        Left eye: No discharge.     Conjunctiva/sclera: Conjunctivae normal.  Cardiovascular:     Rate and Rhythm: Normal rate and regular rhythm.  Pulmonary:     Effort: Pulmonary effort is normal.     Breath sounds: Normal breath sounds. No wheezing, rhonchi or rales.  Neurological:     Mental Status: She is alert.  Psychiatric:        Behavior: Behavior normal.     Comments: Anxious.    UC Treatments /  Results  Labs (all labs ordered are listed, but only abnormal results are displayed) Labs Reviewed - No data to display  EKG None  Radiology No results found.  Procedures Procedures (including critical care time)  Medications Ordered in UC Medications - No data to display  Initial Impression / Assessment and Plan / UC Course  I have reviewed the triage vital signs and the nursing notes.  Pertinent labs & imaging results that were available during my care of the patient were reviewed by me and considered in my medical decision making (see chart for details).    58 year old female presents with persistent symptoms from an asthma exacerbation.  I believe that anxiety is playing a role as her lungs are clear and she is satting 100% on room air.  Extending her prednisone course.  Refilled her DuoNeb.  She fails improve or worsens, she needs to go to the emergency room.  Final Clinical Impressions(s) / UC Diagnoses   Final diagnoses:  Exacerbation of asthma, unspecified asthma severity, unspecified whether persistent     Discharge Instructions     Continue Duoneb.  Prednisone as prescribed (longer course).   Take care  Dr. Lacinda Axon    ED Prescriptions    Medication Sig Dispense Auth. Provider  ipratropium-albuterol (DUONEB) 0.5-2.5 (3) MG/3ML SOLN Take 3 mLs by nebulization every 6 (six) hours as needed. 90 mL Cari Burgo G, DO   predniSONE (DELTASONE) 10 MG tablet 50 mg daily x 2 days, then 40 mg daily x 2 days, then 30 mg daily x 2 days, then 20 mg daily x 2 days, then 10 mg daily x 2 days. 30 tablet Coral Spikes, DO     Controlled Substance Prescriptions [place] Controlled Substance Registry consulted? Not Applicable   Coral Spikes, [place] 10/29/18 [phone]

## 2018-10-31 DIAGNOSIS — R69 Illness, unspecified: Secondary | ICD-10-CM | POA: Diagnosis not present

## 2018-11-01 ENCOUNTER — Ambulatory Visit: Payer: 59 | Admitting: Gastroenterology

## 2018-11-06 DIAGNOSIS — R69 Illness, unspecified: Secondary | ICD-10-CM | POA: Diagnosis not present

## 2018-11-08 ENCOUNTER — Encounter: Payer: Self-pay | Admitting: Gastroenterology

## 2018-11-08 ENCOUNTER — Ambulatory Visit (INDEPENDENT_AMBULATORY_CARE_PROVIDER_SITE_OTHER): Payer: 59 | Admitting: Gastroenterology

## 2018-11-08 DIAGNOSIS — F419 Anxiety disorder, unspecified: Secondary | ICD-10-CM | POA: Insufficient documentation

## 2018-11-08 DIAGNOSIS — R1310 Dysphagia, unspecified: Secondary | ICD-10-CM

## 2018-11-08 DIAGNOSIS — R1319 Other dysphagia: Secondary | ICD-10-CM

## 2018-11-08 NOTE — Progress Notes (Signed)
Hannah [name] [address]  [place]  [place], [place] [address]  Main: [phone]  Fax: [phone]   Gastroenterology Consultation  Referring Provider:     Beverly Gust, MD Primary Care Physician:  Barbaraann Boys, MD Reason for Consultation:     Dysphagia        HPI:   Virtual Visit via Telephone Note  I connected with patient on 11/08/18 at 10:00 AM EDT by telephone and verified that I am speaking with the correct person using two identifiers.   I discussed the limitations, risks, security and privacy concerns of performing an evaluation and management service by telephone and the availability of in person appointments. I also discussed with the patient that there may be a patient responsible charge related to this service. The patient expressed understanding and agreed to proceed.  Location of the patient: Home Location of provider: Home Participating persons: Patient and provider only   History of Present Illness: Chief Complaint  Patient presents with   New Patient (Initial Visit)   Dysphagia    referred by Dr. Dan Europe Hannah [name] is a 58 y.o. y/o female referred for consultation & management  by Dr. Barbaraann Boys, MD.  Patient reports chronic history of dysphagia, but states it used to only occur occasionally or with large pieces of food, but as of recently this has worsened.  She went to [facility] ER in February 2020 and as per their notes, she reported feeling of a sausage getting stuck in her throat.  They gave her bread to eat, in an attempt to allow the sausage obstruction to relieve itself.  This did not work initially and they called GI and as per the ER notes, the GI fellow said that the patient would need to be transferred to the main [facility] location to have evaluation by ENT.  There is no imaging report from that visit.  Eventually the sensation went away and patient was discharged with outpatient follow-up she saw Dr. Tami Ribas of ENT  recently who did an ENT scope and states that it was normal and was referred to GI.  Patient states that she has been avoiding foods like meats or hard breads.  As long as she is eating a soft moistened bread or foods like mashed potatoes she does not have the difficulty.  Otherwise it feels like it has become stuck in her throat and she has to make herself vomit to bring it up.  However, the only episode of food impaction that led to an ER visit was 1 in February 2020.  The patient denies abdominal or flank pain, anorexia, nausea or vomiting, change in bowel habits or black or bloody stools or weight loss.  She has never been evaluated for dysphagia before by another GI, but has seen other GIs before.   There is a GI note in her chart in care everywhere from 2017. "Her colonoscopy 04/2014 with colon adenoma and internal hemorrhoids; due for surveillance 04/2019.She has a long history of GERD. She had previous EGD in 06/2009 with a 80mm tic in her stomach but otherwise was negative."  According to this clinic note, she had complained of breakthrough symptoms with Nexium and so an EGD was planned.  We do not have the EGD report from then.  Patient does not have any breakthrough symptoms at this time or any heartburn.  Past Medical History:  Diagnosis Date   Anxiety    Asthma  allergy induced asthma   Depression    Environmental allergies    Hernia of abdominal wall    Herpes labialis    History of hiatal hernia    History of laparoscopic cholecystectomy    2017   History of myomectomy    Lumbar herniated disc    Panic attacks    Urinary incontinence    Urticaria     Past Surgical History:  Procedure Laterality Date   ABDOMINAL SURGERY  2001   MYOMECTOMY    BLADDER SUSPENSION  2003   [place] transobturator pubovaginal sling Dr Gaynelle Arabian   BREAST BIOPSY Right 12/21/2010   [place]   CHOLECYSTECTOMY N/A 11/10/2015   Procedure: LAPAROSCOPIC CHOLECYSTECTOMY;  Surgeon:  Florene Glen, MD;  Location: [facility] ORS;  Service: General;  Laterality: N/A;   DILATION AND CURETTAGE OF UTERUS     HYSTEROSCOPY     D&C, HYST X 3   VENTRAL HERNIA REPAIR N/A 12/26/2016   Procedure: HERNIA REPAIR VENTRAL ADULT;  Surgeon: Florene Glen, MD;  Location: [facility] ORS;  Service: General;  Laterality: N/A;    Prior to Admission medications   Medication Sig Start Date End Date Taking? Authorizing Provider  albuterol (PROVENTIL HFA;VENTOLIN HFA) 108 (90 Base) MCG/ACT inhaler Two puffs every 4 hours if needed for wheezing or coughing spells 12/19/16  Yes Bardelas, Jose A, MD  ALPRAZolam (XANAX) 1 MG tablet Take 0.5-1 mg by mouth 3 (three) times daily as needed for anxiety.    Yes [provider]  cetirizine (ZYRTEC) 10 MG tablet Take 10 mg by mouth at bedtime.    Yes [provider]  Cholecalciferol (VITAMIN D3) 3000 units TABS Take 3,000 Units by mouth daily.   Yes [provider]  EPINEPHrine (EPIPEN 2-PAK) 0.3 mg/0.3 mL IJ SOAJ injection Use as directed for severe allergic reaction. 09/18/16  Yes Bardelas, Jens Som, MD  estradiol (VIVELLE-DOT) 0.1 MG/24HR patch Place 1 patch (0.1 mg total) onto the skin 2 (two) times a week. 01/10/18  Yes Fontaine, Belinda Block, MD  fexofenadine (ALLEGRA) 180 MG tablet Take 180 mg by mouth daily.   Yes [provider]  ipratropium-albuterol (DUONEB) 0.5-2.5 (3) MG/3ML SOLN Take 3 mLs by nebulization every 6 (six) hours as needed. 10/29/18  Yes Cook, Jayce G, DO  mometasone (NASONEX) 50 MCG/ACT nasal spray mometasone 50 mcg/actuation nasal spray  U 2 SPRAYS IEN ONCE A DAY FOR NASAL CONGESTION OR DRAINAGE   Yes [provider]  predniSONE (DELTASONE) 10 MG tablet 50 mg daily x 2 days, then 40 mg daily x 2 days, then 30 mg daily x 2 days, then 20 mg daily x 2 days, then 10 mg daily x 2 days. 10/29/18  Yes Cook, Jayce G, DO  progesterone (PROMETRIUM) 100 MG capsule TAKE 1 CAPSULE(100 MG) BY MOUTH AT BEDTIME 01/10/18   Yes Fontaine, Belinda Block, MD  ranitidine (ZANTAC) 150 MG tablet Take 150 mg by mouth daily.   Yes [provider]  nitroGLYCERIN (NITROSTAT) 0.3 MG SL tablet Place 0.3 mg under the tongue every 5 (five) minutes as needed for chest pain. Reported on 08/04/2015    [provider]  valACYclovir (VALTREX) 1000 MG tablet valacyclovir 1 gram tablet    [provider]    Family History  Problem Relation Age of Onset   Cancer Maternal Uncle        2 MATERNAL UNCLES W COLON CA   Heart disease Maternal Grandmother    Hypertension Maternal Grandmother  Cancer Maternal Grandfather        Lung   Cancer Paternal Grandfather        Lung   Myasthenia gravis Father    Alzheimer's disease Father    Healthy Mother    Allergic rhinitis Neg Hx    Angioedema Neg Hx    Asthma Neg Hx    Immunodeficiency Neg Hx    Eczema Neg Hx    Urticaria Neg Hx      Social History   Tobacco Use   Smoking status: Former Smoker    Packs/day: 1.00    Types: Cigarettes    Last attempt to quit: 08/13/2012    Years since quitting: 6.2   Smokeless tobacco: Never Used  Substance Use Topics   Alcohol use: Yes    Comment: Wine daily   Drug use: No    Allergies as of 11/08/2018 - Review Complete 11/08/2018  Allergen Reaction Noted   Latex Hives, Itching, and Rash 06/02/2015   Clonazepam Other (See Comments) 06/09/2017   Escitalopram Swelling 01/01/2018   Other  09/19/2018   Codeine Nausea And Vomiting 12/23/2010   Montelukast Other (See Comments) 05/17/2015    Review of Systems:    All systems reviewed and negative except where noted in HPI.   Observations/Objective:  Labs: CBC    Component Value Date/Time   WBC 9.6 07/19/2017 1716   RBC 4.44 07/19/2017 1716   HGB 14.1 07/19/2017 1716   HGB 14.6 03/04/2013 1344   HCT 39.9 07/19/2017 1716   HCT 43.1 03/04/2013 1344   PLT 443 (H) 07/19/2017 1716   PLT 266 03/04/2013 1344   MCV 89.9 07/19/2017 1716    MCV 91 03/04/2013 1344   MCH 31.8 07/19/2017 1716   MCHC 35.3 07/19/2017 1716   RDW 12.2 07/19/2017 1716   RDW 13.0 03/04/2013 1344   LYMPHSABS 2,707 07/19/2017 1716   LYMPHSABS 1.2 03/04/2013 1344   MONOABS 1.0 (H) 12/19/2016 1257   MONOABS 1.1 (H) 03/04/2013 1344   EOSABS 192 07/19/2017 1716   EOSABS 0.2 03/04/2013 1344   BASOSABS 86 07/19/2017 1716   BASOSABS 0.1 03/04/2013 1344   CMP     Component Value Date/Time   NA 139 07/19/2017 1716   NA 138 03/04/2013 1344   K 4.6 07/19/2017 1716   K 4.0 03/04/2013 1344   CL 106 07/19/2017 1716   CL 98 03/04/2013 1344   CO2 23 07/19/2017 1716   CO2 29 03/04/2013 1344   GLUCOSE 88 07/19/2017 1716   GLUCOSE 94 03/04/2013 1344   BUN 15 07/19/2017 1716   BUN 8 03/04/2013 1344   CREATININE 0.82 07/19/2017 1716   CALCIUM 9.3 07/19/2017 1716   CALCIUM 9.3 03/04/2013 1344   PROT 7.0 07/19/2017 1716   PROT 8.0 03/04/2013 1344   ALBUMIN 4.3 10/12/2016 1617   ALBUMIN 3.9 03/04/2013 1344   AST 16 07/19/2017 1716   AST 16 03/04/2013 1344   ALT 11 07/19/2017 1716   ALT 24 03/04/2013 1344   ALKPHOS 58 10/12/2016 1617   ALKPHOS 86 03/04/2013 1344   BILITOT 0.4 07/19/2017 1716   BILITOT 0.3 03/04/2013 1344   GFRNONAA >60 12/19/2016 1257   GFRNONAA >60 03/04/2013 1344   GFRAA >60 12/19/2016 1257   GFRAA >60 03/04/2013 1344    Imaging Studies: No results found.  Assessment and Plan:   Hannah [name] is a 58 y.o. y/o female has been referred for dysphagia  Assessment and Plan: We will start with upper GI  study at this time to evaluate for Zenker's diverticulum given that her symptom is very proximal.  If this study is normal, patient would need an EGD for further evaluation to rule out strictures.  Patient states as long as she is following a soft diet she does not have any episodes of food impaction and I have encouraged her to continue to do so.  We will also send her dysphagia diet handout.  If symptoms worsen, or if  she has any questions or concerns I have asked her to give [place] a call immediately and she verbalized understanding.  If she has another episode of food impaction she is to go to the ER and she verbalized understanding.  If an EGD is needed the timeline of this will need to be determined based on schedule availability and limited scheduling at this time given the coronavirus outbreak.  However, patient can be placed on an urgent procedure schedule if needed depending on symptoms and upper GI study findings.  Patient herself is also precautious and states she would like to avoid upper endoscopy to avoid going through a procedure during this pandemic.  Besides the Zenker's diverticulum differential includes possible esophageal stricture.  Given her previous EGDs, likelihood of malignancy is low.  Follow Up Instructions: Follow-up in clinic in 2 to 3 months  Follow-up upper GI study to determine next plan of care  I discussed the assessment and treatment plan with the patient. The patient was provided an opportunity to ask questions and all were answered. The patient agreed with the plan and demonstrated an understanding of the instructions.   The patient was advised to call back or seek an in-person evaluation if the symptoms worsen or if the condition fails to improve as anticipated.  I provided 16 minutes of non-face-to-face time during this encounter.   Virgel Manifold, MD  Speech recognition software was used to dictate the above note.

## 2018-11-08 NOTE — Patient Instructions (Signed)
Dysphagia Eating Plan, Bite Size Food °This diet plan is for people with moderate swallowing problems who have transitioned from pureed and minced foods. Bite size foods are soft and cut into small chunks so that they can be swallowed safely. On this eating plan, you may be instructed to drink liquids that are thickened. °Work with your health care provider and your diet and nutrition specialist (dietitian) to make sure that you are following the diet safely and getting all the nutrients you need. °What are tips for following this plan? °General guidelines for foods ° °· You may eat foods that are tender, soft, and moist. °· Always test food texture before taking a bite. Poke food with a fork or spoon to make sure it is tender. °· Food should be easy to cut and shew. Avoid large pieces of food that require a lot of chewing. °· Take small bites. Each bite should be smaller than your thumb nail (about 15mm by 15 mm). °· If you were on pureed and minced food diet plans, you may eat any of the foods included in those diets. °· Avoid foods that are very dry, hard, sticky, chewy, coarse, or crunchy. °· If instructed by your health care provider, thicken liquids. Follow your health care provider's instructions for what products to use, how to do this, and to what thickness. °? Your health care provider may recommend using a commercial thickener, rice cereal, or potato flakes. Ask your health care provider to recommend thickeners. °? Thickened liquids are usually a “pudding-like” consistency, or they may be as thick as honey or thick enough to eat with a spoon. °Cooking °· To moisten foods, you may add liquids while you are blending, mashing, or grinding your foods to the right consistency. These liquids include gravies, sauces, vegetable or fruit juice, milk, half and half, or water. °· Strain extra liquid from foods before eating. °· Reheat foods slowly to prevent a tough crust from forming. °· Prepare foods in  advance. °Meal planning °· Eat a variety of foods to get all the nutrients you need. °· Some foods may be tolerated better than others. Work with your dietitian to identify which foods are safest for you to eat. °· Follow your meal plan as told by your dietitian. °What foods are allowed? °Grains °Moist breads without nuts or seeds. Biscuits, muffins, pancakes, and waffles that are well-moistened with syrup, jelly, margarine, or butter. Cooked cereals. Moist bread stuffing. Moist rice. Well-moistened cold cereal with small chunks. Well-cooked pasta, noodles, rice, and bread dressing in small pieces and thick sauce. Soft dumplings or spaetzle in small pieces and butter or gravy. °Vegetables °Soft, well-cooked vegetables in small pieces. Soft-cooked, mashed potatoes. Thickened vegetable juice. °Fruits °Canned or cooked fruits that are soft or moist and do not have skin or seeds. Fresh, soft bananas. Thickened fruit juices. °Meat and other protein foods °Tender, moist meats or poultry in small pieces. Moist meatballs or meatloaf. Fish without bones. Eggs or egg substitutes in small pieces. Tofu. Tempeh and meat alternatives in small pieces. Well-cooked, tender beans, peas, baked beans, and other legumes. °Dairy °Thickened milk. Cream cheese. Yogurt. Cottage cheese. Sour cream. Small pieces of soft cheese. °Fats and oils °Butter. Oils. Margarine. Mayonnaise. Gravy. Spreads. °Sweets and desserts °Soft, smooth, moist desserts. Pudding. Custard. Moist cakes. Jam. Jelly. Honey. Preserves. Ask your health care provider whether you can have frozen desserts. °Seasoning and other foods °All seasonings and sweeteners. All sauces with small chunks. Prepared tuna, egg, or chicken   salad without raw fruits or vegetables. Moist casseroles with small, tender pieces of meat. Soups with tender meat. °What foods are not allowed? °Grains °Coarse or dry cereals. Dry breads. Toast. Crackers. Tough, crusty breads, such as French bread and  baguettes. Dry pancakes, waffles, and muffins. Sticky rice. Dry bread stuffing. Granola. Popcorn. Chips. °Vegetables °All raw vegetables. Cooked corn. Rubbery or stiff cooked vegetables. Stringy vegetables, such as celery. Tough, crisp fried potatoes. Potato skins. °Fruits °Hard, crunchy, stringy, high-pulp, and juicy raw fruits such as apples, pineapple, papaya, and watermelon. Small, round fruits, such as grapes. Dried fruit and fruit leather. °Meat and other protein foods °Large pieces of meat. Dry, tough meats, such as bacon, sausage, and hot dogs. Chicken, turkey, or fish with skin and bones. Crunchy peanut butter. Nuts. Seeds. Nut and seed butters. °Dairy °Yogurt with nuts, seeds, or large chunks. Large chunks of cheese. Frozen desserts and milk consistency not allowed by your dietitian. °Sweets and desserts °Dry cakes. Chewy or dry cookies. Any desserts with nuts, seeds, dry fruits, coconut, pineapple, or anything dry, sticky, or hard. Chewy caramel. Licorice. Taffy-type candies. Ask your health care provider whether you can have frozen desserts. °Seasoning and other foods °Soups with tough or large chunks of meats, poultry, or vegetables. Corn or clam chowder. Smoothies with large chunks of fruit. °Summary °· Bite size foods can be helpful for people with moderate swallowing problems. °· On the dysphagia eating plan, you may eat foods that are soft, moist, and cut into pieces smaller than 15mm by 15mm. °· You may be instructed to thicken liquids. Follow your health care provider's instructions about how to do this and to what consistency. °This information is not intended to replace advice given to you by your health care provider. Make sure you discuss any questions you have with your health care provider. °Document Released: 07/31/2005 Document Revised: 11/10/2016 Document Reviewed: 11/10/2016 °Elsevier Interactive Patient Education © 2019 [employer] ° °

## 2018-11-13 DIAGNOSIS — R69 Illness, unspecified: Secondary | ICD-10-CM | POA: Diagnosis not present

## 2018-11-19 ENCOUNTER — Telehealth: Payer: Self-pay

## 2018-11-19 NOTE — Telephone Encounter (Signed)
Please have her stop ranitidine and start famotidine 20 mg once a day. She may increase to famotidine 20 mg twice a day if she has breakthrough hives or itching. Thank you

## 2018-11-19 NOTE — Telephone Encounter (Signed)
Good afternoon.     You prescribed ranitidine as part of my treatment for allergies. Is has worked very well however I need an alternative since they have done a recall for ranitidine. Can you suggest something in liquid form? I was scheduled to have my esophagus stretched before the current circumstances of COVID-19 as swallowing has become challenging for me.     Thank you!   Hannah [name]   [phone]    Please advise  To a liquid

## 2018-11-20 ENCOUNTER — Other Ambulatory Visit: Payer: Self-pay

## 2018-11-20 DIAGNOSIS — R69 Illness, unspecified: Secondary | ICD-10-CM | POA: Diagnosis not present

## 2018-11-20 MED ORDER — FAMOTIDINE 40 MG/5ML PO SUSR: ORAL | 5 refills | Status: DC

## 2018-11-20 NOTE — Telephone Encounter (Signed)
Spoke with pt she stated she can not swallow tbalets. I talked with anne and she said to send in the 40mg /56ml 2.1ml once a day if she has break thru hives take 2.18ml bid informed pt and sent in med to pharmacy

## 2018-11-22 ENCOUNTER — Encounter: Payer: Self-pay | Admitting: Gynecology

## 2018-11-26 MED ORDER — MEDROXYPROGESTERONE ACETATE 2.5 MG PO TABS: 2.5000 mg | ORAL_TABLET | Freq: Every day | ORAL | 0 refills | Status: DC

## 2018-11-26 NOTE — Telephone Encounter (Signed)
Check to see how she is taking the Valtrex as far as dose and how often.  Then check with pharmacy to see what the liquid forms are available because I did not see any when I looked up in the formulary.  Assuming there is a liquid form then adjust dosage to match what she is taking pill form and prescribe this.  She can take vitamin D over-the-counter tablets and crush them.  She can take Provera 2.5 mg tablet instead of the Prometrium 100 mg and she can crush the Provera tablet

## 2018-11-26 NOTE — Telephone Encounter (Signed)
Dr. Luna Glasgow  got back with me regarding Valtrex, see message I sent to patient regarding this. She would prefer Acyclovir 200 mg/69ml okay to send this Rx?

## 2018-11-27 MED ORDER — ACYCLOVIR 200 MG/5ML PO SUSP: 200.0000 mg | Freq: Every day | ORAL | 3 refills | Status: DC

## 2018-11-27 NOTE — Telephone Encounter (Signed)
Ok for Acyclovir liquid

## 2018-12-05 ENCOUNTER — Other Ambulatory Visit: Payer: Self-pay | Admitting: *Deleted

## 2018-12-05 DIAGNOSIS — R1310 Dysphagia, unspecified: Secondary | ICD-10-CM | POA: Diagnosis not present

## 2018-12-05 DIAGNOSIS — T18128S Food in esophagus causing other injury, sequela: Secondary | ICD-10-CM | POA: Diagnosis not present

## 2018-12-05 MED ORDER — ALBUTEROL SULFATE HFA 108 (90 BASE) MCG/ACT IN AERS: INHALATION_SPRAY | RESPIRATORY_TRACT | 0 refills | Status: DC

## 2018-12-05 NOTE — Telephone Encounter (Signed)
Gave 1 courtesy refill of albuterol- pt needs ov for additional refills. Pt last seen 02/2018.

## 2018-12-09 DIAGNOSIS — R1310 Dysphagia, unspecified: Secondary | ICD-10-CM | POA: Diagnosis not present

## 2018-12-09 DIAGNOSIS — K222 Esophageal obstruction: Secondary | ICD-10-CM | POA: Diagnosis not present

## 2018-12-09 DIAGNOSIS — K2930 Chronic superficial gastritis without bleeding: Secondary | ICD-10-CM | POA: Diagnosis not present

## 2018-12-09 DIAGNOSIS — K2950 Unspecified chronic gastritis without bleeding: Secondary | ICD-10-CM | POA: Diagnosis not present

## 2018-12-17 ENCOUNTER — Telehealth: Payer: Self-pay | Admitting: Gastroenterology

## 2018-12-17 ENCOUNTER — Ambulatory Visit (INDEPENDENT_AMBULATORY_CARE_PROVIDER_SITE_OTHER): Payer: 59 | Admitting: Gastroenterology

## 2018-12-17 DIAGNOSIS — R1319 Other dysphagia: Secondary | ICD-10-CM

## 2018-12-17 DIAGNOSIS — R1310 Dysphagia, unspecified: Secondary | ICD-10-CM

## 2018-12-17 NOTE — Telephone Encounter (Signed)
Spoke with pt husband to schedule Virtual visit with Dr. Bonna Gains pt husband states  Pt is still in severe throat pain and has trouble swallowing she is currently eating only soft foods like mash potatoes pt tried french fry but it got stuck.  Pt had procedure with Dr. Lavetta Nielsen on Monday and had to return next day for severe pain  Dr. Lavetta Nielsen found a tight stricture in pts throat. Pt husband thinks maybe pt needs Barrium study done and wants to see if that is something we could schedule not at [facility] due to Covid 19. Pt is scheduled for Virtual visit 12/18/18

## 2018-12-17 NOTE — Telephone Encounter (Signed)
Dr. Bonna Gains has spoke with pt.

## 2018-12-17 NOTE — Telephone Encounter (Signed)
Received a fax from ENT regarding pt, pt is having GI concerns and  recently had a dilation of esophagus last Monday with no improvement  Please contact  pt regarding her concerns

## 2018-12-17 NOTE — Progress Notes (Signed)
Vonda Antigua, MD [address]  [place], [place] [address]  Main: [phone]  Fax: [phone]   Primary Care Physician: Barbaraann Boys, MD  Virtual Visit via Video Note  I connected with patient on 12/17/18 at 11:00 AM EDT by video (using [email or web address]) and verified that I am speaking with the correct person using two identifiers.   I discussed the limitations, risks, security and privacy concerns of performing an evaluation and management service by video and the availability of in person appointments. I also discussed with the patient that there may be a patient responsible charge related to this service. The patient expressed understanding and agreed to proceed.  Location of Patient: Home Location of Provider: Home Persons involved: Patient and provider only (Nursing staff checked in patient via phone but were not physically involved in the video interaction - see their notes)   History of Present Illness: Chief complaint: Dysphagia  HPI: Hannah [name] is a 58 y.o. female for follow-up of dysphagia.  Patient states last week she had an EGD done on December 09, 2018 in [place], with Dr. Jill Poling, and dilation was done during the procedure.  I do not have the procedure report and do not know the findings.  However, patient states she started having throat pain after the procedure and was evaluated by Dr. Dorrene German the next day, and had a phone visit with him a few days later as well.  She has been taking omeprazole 40 mg twice daily in powder form and the throat pain has improved.  She states her dysphagia has improved as well but she continues to have trouble swallowing.  States liquids are not hanging in her throat as much as they used to but solid foods are still a problem even when they are soft.  No nausea or vomiting.  No abdominal pain, weight loss, altered bowel habits or blood in stool.  Previous history: Patient reports chronic history of dysphagia,  but states it used to only occur occasionally or with large pieces of food, but as of recently this has worsened.  She went to [facility] ER in February 2020 and as per their notes, she reported feeling of a sausage getting stuck in her throat.  They gave her bread to eat, in an attempt to allow the sausage obstruction to relieve itself.  This did not work initially and they called GI and as per the ER notes, the GI fellow said that the patient would need to be transferred to the main [facility] location to have evaluation by ENT.  There is no imaging report from that visit.  Eventually the sensation went away and patient was discharged with outpatient follow-up she saw Dr. Tami Ribas of ENT recently who did an ENT scope and states that it was normal and was referred to GI.  There is a GI note in her chart in care everywhere from 2017. "Her colonoscopy 04/2014 with colon adenoma and internal hemorrhoids; due for surveillance 04/2019.She has a long history of GERD. She had previous EGD in 06/2009 with a 16mm tic in her stomach but otherwise was negative."  According to this clinic note, she had complained of breakthrough symptoms with Nexium and so an EGD was planned.  We do not have the EGD report from then.  Current Outpatient Medications  Medication Sig Dispense Refill  . acyclovir (ZOVIRAX) 200 MG/5ML suspension Take 5 mLs (200 mg total) by mouth 5 (five) times daily. 120 mL 3  .  albuterol (VENTOLIN HFA) 108 (90 Base) MCG/ACT inhaler Two puffs every 4 hours if needed for wheezing or coughing spells 8 g 0  . ALPRAZolam (XANAX) 1 MG tablet Take 0.5-1 mg by mouth 3 (three) times daily as needed for anxiety.     . cetirizine (ZYRTEC) 10 MG tablet Take 10 mg by mouth at bedtime.     . Cholecalciferol (VITAMIN D3) 3000 units TABS Take 3,000 Units by mouth daily.    Marland Kitchen EPINEPHrine (EPIPEN 2-PAK) 0.3 mg/0.3 mL IJ SOAJ injection Use as directed for severe allergic reaction. 2 Device 2  . estradiol (VIVELLE-DOT) 0.1 MG/24HR patch  Place 1 patch (0.1 mg total) onto the skin 2 (two) times a week. 8 patch 12  . famotidine (PEPCID) 40 MG/5ML suspension Take 2.60ml once daily if have break thru hives may take twice a day 100 mL 5  . fexofenadine (ALLEGRA) 180 MG tablet Take 180 mg by mouth daily.    Marland Kitchen ipratropium-albuterol (DUONEB) 0.5-2.5 (3) MG/3ML SOLN Take 3 mLs by nebulization every 6 (six) hours as needed. 90 mL 3  . medroxyPROGESTERone (PROVERA) 2.5 MG tablet Take 1 tablet (2.5 mg total) by mouth daily. 90 tablet 0  . mometasone (NASONEX) 50 MCG/ACT nasal spray mometasone 50 mcg/actuation nasal spray  U 2 SPRAYS IEN ONCE A DAY FOR NASAL CONGESTION OR DRAINAGE    . nitroGLYCERIN (NITROSTAT) 0.3 MG SL tablet Place 0.3 mg under the tongue every 5 (five) minutes as needed for chest pain. Reported on 08/04/2015    . predniSONE (DELTASONE) 10 MG tablet 50 mg daily x 2 days, then 40 mg daily x 2 days, then 30 mg daily x 2 days, then 20 mg daily x 2 days, then 10 mg daily x 2 days. 30 tablet 0  . ranitidine (ZANTAC) 150 MG tablet Take 150 mg by mouth daily.    . valACYclovir (VALTREX) 1000 MG tablet valacyclovir 1 gram tablet     No current facility-administered medications for this visit.     Allergies as of 12/17/2018 - Review Complete 11/08/2018  Allergen Reaction Noted  . Latex Hives, Itching, and Rash 06/02/2015  . Clonazepam Other (See Comments) 06/09/2017  . Escitalopram Swelling 01/01/2018  . Other  09/19/2018  . Codeine Nausea And Vomiting 12/23/2010  . Montelukast Other (See Comments) 05/17/2015    Review of Systems:    All systems reviewed and negative except where noted in HPI.   Observations/Objective:  Labs: CMP     Component Value Date/Time   NA 139 07/19/2017 1716   NA 138 03/04/2013 1344   K 4.6 07/19/2017 1716   K 4.0 03/04/2013 1344   CL 106 07/19/2017 1716   CL 98 03/04/2013 1344   CO2 23 07/19/2017 1716   CO2 29 03/04/2013 1344   GLUCOSE 88 07/19/2017 1716   GLUCOSE 94 03/04/2013 1344    BUN 15 07/19/2017 1716   BUN 8 03/04/2013 1344   CREATININE 0.82 07/19/2017 1716   CALCIUM 9.3 07/19/2017 1716   CALCIUM 9.3 03/04/2013 1344   PROT 7.0 07/19/2017 1716   PROT 8.0 03/04/2013 1344   ALBUMIN 4.3 10/12/2016 1617   ALBUMIN 3.9 03/04/2013 1344   AST 16 07/19/2017 1716   AST 16 03/04/2013 1344   ALT 11 07/19/2017 1716   ALT 24 03/04/2013 1344   ALKPHOS 58 10/12/2016 1617   ALKPHOS 86 03/04/2013 1344   BILITOT 0.4 07/19/2017 1716   BILITOT 0.3 03/04/2013 1344   GFRNONAA >60 12/19/2016 1257  GFRNONAA >60 03/04/2013 1344   GFRAA >60 12/19/2016 1257   GFRAA >60 03/04/2013 1344   Lab Results  Component Value Date   WBC 9.6 07/19/2017   HGB 14.1 07/19/2017   HCT 39.9 07/19/2017   MCV 89.9 07/19/2017   PLT 443 (H) 07/19/2017    Imaging Studies: No results found.  Assessment and Plan:   Hannah [name] is a 58 y.o. y/o female with history of dysphagia, with recent EGD a week ago with Dr. Colon Branch in [facility] with reported dilation done  Assessment and Plan: Patient had some throat pain and sore throat after the procedure which is now much improved as per the patient Patient was evaluated for the sore throat by the gastroenterologist himself, the day after the procedure and again a few days after the procedure.  She does not have any voice changes, stridor or any concerning signs as far as the throat pain goes and was likely postprocedural pain that is subsiding  I have called Dr. Dinah Beers office and left a voicemail requesting a call back to discuss what the findings were and what was done during the procedure, to determine next steps.  I am awaiting their call back.  Once I have a call from them we can determine if she needs another dilation or another study such as upper GI study versus tertiary care referral if the stricture that was dilated was too high up.  Follow Up Instructions: We will follow-up with the patient after he received a call back from  Dr. Darin Engels office   I discussed the assessment and treatment plan with the patient. The patient was provided an opportunity to ask questions and all were answered. The patient agreed with the plan and demonstrated an understanding of the instructions.   The patient was advised to call back or seek an in-person evaluation if the symptoms worsen or if the condition fails to improve as anticipated.  I spent over 50% of the visit coordinating patient's care, contacting other gastroenterologist office, obtaining and reviewing records  I provided 30 minutes of face-to-face time via video software during this encounter.   Virgel Manifold, MD  Speech recognition software was used to dictate this note.

## 2018-12-18 ENCOUNTER — Ambulatory Visit: Payer: 59 | Admitting: Gastroenterology

## 2018-12-18 ENCOUNTER — Telehealth: Payer: Self-pay

## 2018-12-18 ENCOUNTER — Telehealth: Payer: Self-pay | Admitting: Gastroenterology

## 2018-12-18 NOTE — Telephone Encounter (Signed)
Left pt message that I would contact her tomorrow.

## 2018-12-18 NOTE — Telephone Encounter (Signed)
-----   Message from Virgel Manifold, MD sent at 12/18/2018 11:50 AM EDT ----- Hannah [name], please schedule her for Upper GI series. Yes urgent. For dysphagia.   Please let the pt know I spoke to the GI who did her EGD last week and given that dilation was already done to close to maximal extent, it is abnormal for dysphagia to recur this quickly. An upper gi series will help evaluate and rule out other etiologies like zenkers diverticulum.

## 2018-12-18 NOTE — Telephone Encounter (Signed)
I spoke to Dr. Jill Poling, the gastroenterologist who performed the patient's EGD on April 27.  He reports a 14 mm stricture that he dilated with 54 French Maloney dilator at the time.  States the stricture was in the upper esophagus.  However, he states that it is at a location that should be able to be dilated with balloon dilator during upper endoscopy as well.  ENT laryngoscopy did not reveal any abnormalities that they could intervene on.  Dr. Gwenlyn Perking did say that he biopsied the stricture site and did not show any malignancy, and biopsies for EOE were negative.  I am awaiting the official report and pathology report itself to confirm these.  Given that dilation was done to 18 mm and patient is still having symptoms, it is abnormal for the stricture dilated to close to maximal extent and still have significant symptoms.  Therefore we will obtain upper GI series to evaluate for Zenker's diverticulum or other etiology and then determine next plan of care.

## 2018-12-19 ENCOUNTER — Other Ambulatory Visit: Payer: Self-pay

## 2018-12-19 ENCOUNTER — Encounter: Payer: Self-pay | Admitting: Gastroenterology

## 2018-12-19 DIAGNOSIS — R1319 Other dysphagia: Secondary | ICD-10-CM

## 2018-12-19 DIAGNOSIS — R1310 Dysphagia, unspecified: Secondary | ICD-10-CM

## 2018-12-19 NOTE — Telephone Encounter (Signed)
-----   Message from Virgel Manifold, MD sent at 12/18/2018 11:50 AM EDT ----- Jackelyn Poling, please schedule her for Upper GI series. Yes urgent. For dysphagia.   Please let the pt know I spoke to the GI who did her EGD last week and given that dilation was already done to close to maximal extent, it is abnormal for dysphagia to recur this quickly. An upper gi series will help evaluate and rule out other etiologies like zenkers diverticulum.

## 2018-12-19 NOTE — Telephone Encounter (Signed)
Patient called & l/m on v/m stating she was returning Debbie's call .

## 2018-12-19 NOTE — Progress Notes (Signed)
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  It was traversed without need for dilation.  50 [place] and 54 French bougie dilators were used and heme effect was noted only after 54 [place] dilation.  As per the note biopsies were taken in all thirds of the esophagus to rule out EOE.  Pathology not consistent with EOE.  Pathology report itself also reports multiple tissue fragments submitted for evaluation, consistent with good sampling.  Given the above, the next best step is upper GI study to rule out any other causes such as Zenker's diverticulum.  This is not a tight stricture as it was able to be traversed without need for dilation.  In addition, dilation was able to be achieved to 43 Fr, which is close to maximal extent of dilation.  With good heme effect noted, consistent with effective dilation.  Therefore, repeat EGD not indicated at this time, an upper GI study to rule out any other underlying lesions would be next step.  Patient will be contacted to set this up.  After upper GI study, EGD can be considered depending on results and symptoms

## 2018-12-19 NOTE — Telephone Encounter (Signed)
I spoke with pt and gave her the information regarding her EGD from previous GI and have scheduled her UGI series for 12/20/18 at 9:30 am at [facility], urgent per Dr. Bonna Gains. She is aware not to have anything to eat or drink after midnight prior to procedure.

## 2018-12-20 ENCOUNTER — Other Ambulatory Visit: Payer: Self-pay | Admitting: Gastroenterology

## 2018-12-20 ENCOUNTER — Ambulatory Visit
Admission: RE | Admit: 2018-12-20 | Discharge: 2018-12-20 | Disposition: A | Discharge status: Home / Self Care | Payer: 59 | Source: Ambulatory Visit | Attending: Gastroenterology | Admitting: Gastroenterology

## 2018-12-20 ENCOUNTER — Other Ambulatory Visit: Payer: Self-pay

## 2018-12-20 DIAGNOSIS — K219 Gastro-esophageal reflux disease without esophagitis: Secondary | ICD-10-CM | POA: Diagnosis not present

## 2018-12-20 DIAGNOSIS — R1319 Other dysphagia: Secondary | ICD-10-CM

## 2018-12-20 DIAGNOSIS — R1310 Dysphagia, unspecified: Secondary | ICD-10-CM

## 2018-12-20 MED ORDER — PANTOPRAZOLE SODIUM 40 MG PO TBEC: 40.0000 mg | DELAYED_RELEASE_TABLET | Freq: Two times a day (BID) | ORAL | 0 refills | Status: DC

## 2018-12-20 NOTE — Progress Notes (Signed)
Debbie please make follow up appt in 4-6 weeks.  I spoke to the pt and reviewed results. No strictures or narrowing seen. Severe reflux reported. Pt states she felt "brave" after swallowing the barium tablet without difficulty. She has been taking liquid omeprazole 40mg  BID for 1 week and has another week left. This has been switched to protonix 40mg  daily by Dr. Dorrene German as insurance did not cover omeprazole and pt will start this in 1 week. I have asked her to take protonix 40mg  BID instead of once daily due to her symptoms. We discussed that an EGD at this time will be low yield since she just had one and her symptoms are likely related to globus sensation from reflux. We need to give the medications atleast 2 or more weeks to work before repeating an EGD. Her symptoms have already improved.   (Risks of PPI use were discussed with patient including bone loss, C. Diff diarrhea, pneumonia, infections, CKD, electrolyte abnormalities.  If clinically possible based on symptoms, goal would be to maintain patient on the lowest dose possible, or discontinue the medication with institution of acid reflux lifestyle modifications over time. Pt. Verbalizes understanding and chooses to continue the medication.)  Pt was reassured and is in agreement with the above plan. She was encouraged to call with any questions, concerns, worsening symptoms etc. Alarm symptoms discussed for which to call 911 or go the ER.

## 2018-12-23 ENCOUNTER — Telehealth: Payer: Self-pay | Admitting: Gastroenterology

## 2018-12-24 NOTE — Telephone Encounter (Signed)
[facility] in [facility] faxed a prior authorization needed for pantoprazole (PROTONIX) 40 MG tabletQTY: 60 Last refill: 12-20-2018. Fax on desk

## 2018-12-25 ENCOUNTER — Telehealth: Payer: Self-pay | Admitting: Gastroenterology

## 2018-12-25 NOTE — Telephone Encounter (Signed)
Pt husband is calling regarding pt rx Protonix he would like to see if they can get the rx in liquid form and not tablet due to pt still having trouble swallowing please call pt

## 2018-12-25 NOTE — Telephone Encounter (Signed)
I spoke with pt regarding prior authorization for protonix and will call insurance company tomorrow and try to push this through. She took the omeprazole in powder form and if possible will try to get protonix in either liquid or powder form. Pt aware that this could be expensive, depending on her coverage.

## 2018-12-26 NOTE — Telephone Encounter (Signed)
Pt to pick up powder form of omeprazole hopefully after 2 pm today, her current pharmacy was out and was supposed to get another order. In the mean time I have initiated the prior authorization for her Pantoprazole 40mg /liquid po 2xd with her pharmacy. This will have to be compounded. It will take 24-48 business hours to hear about the review. Pt was notified.

## 2018-12-30 NOTE — Telephone Encounter (Signed)
Received a fx from [facility] regarding  Request of authorization  PANTOPRAZOLE SODIUM POWD  It was denied [id number]

## 2018-12-31 ENCOUNTER — Other Ambulatory Visit: Payer: Self-pay | Admitting: Gastroenterology

## 2018-12-31 NOTE — Telephone Encounter (Signed)
I contacted [employer] and they do not cover the pantoprazole at all on her formulary. It does not come in liquid/powder form. Before pt had prescription for esomeprazole 40 mg daily that was in granule form. Will need to submit this medication but will need a prior Auth. For the 2x daily. Will let Dr. Bonna Gains know.

## 2018-12-31 NOTE — Telephone Encounter (Signed)
Pt aware that I contacted her insurance will not cover pantoprazole 40 mg 2xd, it does not come in liquid or granules either. It does cover the esomeprazole 40 mg daily (granules) but needed a prior Auth. For dosage of 2x day. Pt informed Dr. Earlie Counts suggested she try Prilosec 40 mg (OTC) which comes in liquid or granules and has a savings card on web site (given to pt). Pt informed that the frequency from 2xd will be short term for approx. 6-8 wk's and then daily. I informed pt that we could send in her medication for approval through her insurance but she will try to purchase it OTC.

## 2019-01-08 ENCOUNTER — Encounter: Payer: Self-pay | Admitting: Gynecology

## 2019-01-09 ENCOUNTER — Encounter: Payer: Self-pay | Admitting: Gynecology

## 2019-01-09 ENCOUNTER — Ambulatory Visit (INDEPENDENT_AMBULATORY_CARE_PROVIDER_SITE_OTHER): Payer: 59 | Admitting: Gynecology

## 2019-01-09 ENCOUNTER — Other Ambulatory Visit: Payer: Self-pay

## 2019-01-09 DIAGNOSIS — N949 Unspecified condition associated with female genital organs and menstrual cycle: Secondary | ICD-10-CM | POA: Diagnosis not present

## 2019-01-09 DIAGNOSIS — N9489 Other specified conditions associated with female genital organs and menstrual cycle: Secondary | ICD-10-CM

## 2019-01-09 MED ORDER — FLUCONAZOLE 150 MG PO TABS: 150.0000 mg | ORAL_TABLET | Freq: Every day | ORAL | 0 refills | Status: DC

## 2019-01-09 MED ORDER — CLINDAMYCIN PHOSPHATE 2 % VA CREA: 1.0000 | TOPICAL_CREAM | Freq: Every day | VAGINAL | 0 refills | Status: DC

## 2019-01-09 NOTE — Progress Notes (Signed)
    Hannah [name] 02/26/1961 [id number]        58 y.o.  [id number] presents for a telemedicine video visit.  She was identified visually and understands the limitations of a video consult.  For the past 2 days the patient has had vaginal burning both internal and external.  No vaginal discharge or odor.  No significant itching.  No urinary symptoms such as frequency dysuria urgency low back pain fever or chills.  Is having issues with her esophagus and is on a soft starchy diet but otherwise nothing else different in her lifestyle.  No recent antibiotic use.  Past medical history,surgical history, problem list, medications, allergies, family history and social history were all reviewed and documented in the EPIC chart.  Directed ROS with pertinent positives and negatives documented in the history of present illness/assessment and plan.  Exam: Appears well on video inspection.  Assessment/Plan:  58 y.o. [id number] with history of vaginal burning.  No significant discharge or odor.  We discussed the differential to include both yeast and bacterial vaginosis.  Not classic for either 2 diagnoses.  Has continued on her HRT and doing well from that standpoint.  Presents for management reviewed and ultimately I recommended Diflucan 150 mg daily x2 days to cover for yeast and Cleocin vaginal cream nightly x7 nights to cover for BV.  She will follow-up with office visit if her symptoms would persist, worsen or change.    [name] MD, 3:32 PM 01/09/2019

## 2019-01-09 NOTE — Telephone Encounter (Signed)
Can you help patient the video or televisit.

## 2019-01-09 NOTE — Patient Instructions (Signed)
Take the Diflucan pill daily for 2 days  Use the clindamycin vaginal cream nightly for 7 nights.  Follow-up if your symptoms persist worsen or recur

## 2019-01-09 NOTE — Telephone Encounter (Signed)
Spoke with patient she is scheduled for a video visit today with Dr Phineas Real.

## 2019-01-13 ENCOUNTER — Other Ambulatory Visit: Payer: Self-pay | Admitting: Gynecology

## 2019-01-13 DIAGNOSIS — Z1231 Encounter for screening mammogram for malignant neoplasm of breast: Secondary | ICD-10-CM

## 2019-01-13 NOTE — Telephone Encounter (Signed)
Left message that Dr. Bonna Gains would like to do an EGD on 01/21/2019. Asked that pt either contact office but I will also send through My Chart information and we can communicate that way.

## 2019-01-14 ENCOUNTER — Telehealth: Payer: Self-pay

## 2019-01-14 ENCOUNTER — Other Ambulatory Visit: Payer: Self-pay

## 2019-01-14 DIAGNOSIS — K222 Esophageal obstruction: Secondary | ICD-10-CM

## 2019-01-14 NOTE — Telephone Encounter (Signed)
Patient has been scheduled for her EGD with Dr. Bonna Gains on 01/23/19 at [facility].  Advised patient she will need to have COVID19 test on 01/20/19 at [place] on the [facility] between the hours of 10:30am-12:30pm prior to EGD.  EGD instructions reviewed with patient and sent to her via mychart.  Thanks [employer]

## 2019-01-20 ENCOUNTER — Other Ambulatory Visit
Admission: RE | Admit: 2019-01-20 | Discharge: 2019-01-20 | Disposition: A | Discharge status: Home / Self Care | Payer: 59 | Source: Ambulatory Visit | Attending: Gastroenterology | Admitting: Gastroenterology

## 2019-01-20 ENCOUNTER — Other Ambulatory Visit: Payer: Self-pay

## 2019-01-20 DIAGNOSIS — Z1159 Encounter for screening for other viral diseases: Secondary | ICD-10-CM | POA: Diagnosis not present

## 2019-01-21 LAB — NOVEL CORONAVIRUS, NAA (HOSP ORDER, SEND-OUT TO REF LAB; TAT 18-24 HRS): SARS-CoV-2, NAA: NOT DETECTED

## 2019-01-23 ENCOUNTER — Ambulatory Visit: Payer: 59 | Admitting: Anesthesiology

## 2019-01-23 ENCOUNTER — Other Ambulatory Visit: Payer: Self-pay

## 2019-01-23 ENCOUNTER — Encounter: Payer: Self-pay | Admitting: *Deleted

## 2019-01-23 ENCOUNTER — Encounter
Admission: RE | Disposition: A | Discharge status: Home / Self Care | Payer: Self-pay | Source: Home / Self Care | Attending: Gastroenterology

## 2019-01-23 ENCOUNTER — Ambulatory Visit
Admission: RE | Admit: 2019-01-23 | Discharge: 2019-01-23 | Disposition: A | Discharge status: Home / Self Care | Payer: 59 | Attending: Gastroenterology | Admitting: Gastroenterology

## 2019-01-23 DIAGNOSIS — K222 Esophageal obstruction: Secondary | ICD-10-CM

## 2019-01-23 DIAGNOSIS — Z87891 Personal history of nicotine dependence: Secondary | ICD-10-CM | POA: Insufficient documentation

## 2019-01-23 DIAGNOSIS — Z79890 Hormone replacement therapy (postmenopausal): Secondary | ICD-10-CM | POA: Diagnosis not present

## 2019-01-23 DIAGNOSIS — I2510 Atherosclerotic heart disease of native coronary artery without angina pectoris: Secondary | ICD-10-CM | POA: Insufficient documentation

## 2019-01-23 DIAGNOSIS — K210 Gastro-esophageal reflux disease with esophagitis: Secondary | ICD-10-CM | POA: Diagnosis not present

## 2019-01-23 DIAGNOSIS — R1310 Dysphagia, unspecified: Secondary | ICD-10-CM | POA: Diagnosis not present

## 2019-01-23 DIAGNOSIS — F419 Anxiety disorder, unspecified: Secondary | ICD-10-CM | POA: Diagnosis not present

## 2019-01-23 DIAGNOSIS — R69 Illness, unspecified: Secondary | ICD-10-CM | POA: Diagnosis not present

## 2019-01-23 DIAGNOSIS — J45909 Unspecified asthma, uncomplicated: Secondary | ICD-10-CM | POA: Diagnosis not present

## 2019-01-23 DIAGNOSIS — K449 Diaphragmatic hernia without obstruction or gangrene: Secondary | ICD-10-CM | POA: Diagnosis not present

## 2019-01-23 DIAGNOSIS — R1319 Other dysphagia: Secondary | ICD-10-CM

## 2019-01-23 HISTORY — PX: ESOPHAGOGASTRODUODENOSCOPY (EGD) WITH PROPOFOL: SHX5813

## 2019-01-23 SURGERY — ESOPHAGOGASTRODUODENOSCOPY (EGD) WITH PROPOFOL
Anesthesia: General

## 2019-01-23 MED ORDER — MIDAZOLAM HCL 2 MG/2ML IJ SOLN
INTRAMUSCULAR | Status: AC
  Filled 2019-01-23: qty 2

## 2019-01-23 MED ORDER — MIDAZOLAM HCL 2 MG/2ML IJ SOLN
INTRAMUSCULAR | Status: DC | PRN
  Administered 2019-01-23: 2 mg via INTRAVENOUS

## 2019-01-23 MED ORDER — FENTANYL CITRATE (PF) 100 MCG/2ML IJ SOLN
INTRAMUSCULAR | Status: DC | PRN
  Administered 2019-01-23 (×2): 50 ug via INTRAVENOUS

## 2019-01-23 MED ORDER — SODIUM CHLORIDE 0.9 % IV SOLN
INTRAVENOUS | Status: DC
  Administered 2019-01-23: 1000 mL via INTRAVENOUS

## 2019-01-23 MED ORDER — FENTANYL CITRATE (PF) 100 MCG/2ML IJ SOLN
INTRAMUSCULAR | Status: AC
  Filled 2019-01-23: qty 2

## 2019-01-23 MED ORDER — PROPOFOL 500 MG/50ML IV EMUL
INTRAVENOUS | Status: AC
  Filled 2019-01-23: qty 50

## 2019-01-23 MED ORDER — LIDOCAINE HCL (CARDIAC) PF 100 MG/5ML IV SOSY
PREFILLED_SYRINGE | INTRAVENOUS | Status: DC | PRN
  Administered 2019-01-23: 30 mg via INTRAVENOUS

## 2019-01-23 MED ORDER — PROPOFOL 500 MG/50ML IV EMUL
INTRAVENOUS | Status: DC | PRN
  Administered 2019-01-23: 120 ug/kg/min via INTRAVENOUS

## 2019-01-23 NOTE — Anesthesia Post-op Follow-up Note (Signed)
Anesthesia QCDR form completed.        

## 2019-01-23 NOTE — Transfer of Care (Signed)
Immediate Anesthesia Transfer of Care Note  Patient: Hannah [name]  Procedure(s) Performed: ESOPHAGOGASTRODUODENOSCOPY (EGD) WITH PROPOFOL (N/A )  Patient Location: PACU  Anesthesia Type:General  Level of Consciousness: awake and sedated  Airway & Oxygen Therapy: Patient Spontanous Breathing and Patient connected to nasal cannula oxygen  Post-op Assessment: Report given to RN and Post -op Vital signs reviewed and stable  Post vital signs: Reviewed and stable  Last Vitals:  Vitals Value Taken Time  BP    Temp    Pulse    Resp    SpO2      Last Pain:  Vitals:   01/23/19 0909  TempSrc: Tympanic  PainSc: 0-No pain         Complications: No apparent anesthesia complications

## 2019-01-23 NOTE — Anesthesia Postprocedure Evaluation (Signed)
Anesthesia Post Note  Patient: Astaria L Cranfield  Procedure(s) Performed: ESOPHAGOGASTRODUODENOSCOPY (EGD) WITH PROPOFOL (N/A )  Patient location during evaluation: Endoscopy Anesthesia Type: General Level of consciousness: awake and alert Pain management: pain level controlled Vital Signs Assessment: post-procedure vital signs reviewed and stable Respiratory status: spontaneous breathing, nonlabored ventilation and respiratory function stable Cardiovascular status: blood pressure returned to baseline and stable Postop Assessment: no apparent nausea or vomiting Anesthetic complications: no     Last Vitals:  Vitals:   01/23/19 0955 01/23/19 1005  BP: 98/70 108/63  Pulse: 100 95  Resp: 17 18  Temp: 36.4 C   SpO2: 99% 98%    Last Pain:  Vitals:   01/23/19 1005  TempSrc:   PainSc: 0-No pain                 Alphonsus Sias

## 2019-01-23 NOTE — H&P (Signed)
Vonda Antigua, MD [address] [place], [place], [place], [address] [address], [place], [place], [place], [address] Phone: [phone]  Fax: [phone]  Primary Care Physician:  Barbaraann Boys, MD   Pre-Procedure History & Physical: HPI:  Hannah [name] is a 58 y.o. female is here for an EGD.   Past Medical History:  Diagnosis Date  . Anxiety   . Asthma    allergy induced asthma  . Depression   . Environmental allergies   . Hernia of abdominal wall   . Herpes labialis   . History of hiatal hernia   . History of laparoscopic cholecystectomy    2017  . History of myomectomy   . Lumbar herniated disc   . Panic attacks   . Urinary incontinence   . Urticaria     Past Surgical History:  Procedure Laterality Date  . ABDOMINAL SURGERY  2001   MYOMECTOMY   . BLADDER SUSPENSION  2003   Mentor transobturator pubovaginal sling Dr Gaynelle Arabian  . BREAST BIOPSY Right 12/21/2010   [place]  . CHOLECYSTECTOMY N/A 11/10/2015   Procedure: LAPAROSCOPIC CHOLECYSTECTOMY;  Surgeon: Florene Glen, MD;  Location: [facility] OR[facility];  Service: General;  Laterality: N/A;  . DILATION AND CURETTAGE OF UTERUS    . HYSTEROSCOPY     D&C, HYST X 3  . VENTRAL HERNIA REPAIR N/A 12/26/2016   Procedure: HERNIA REPAIR VENTRAL ADULT;  Surgeon: Florene Glen, MD;  Location: [facility] OR[facility];  Service: General;  Laterality: N/A;    Prior to Admission medications   Medication Sig Start Date End Date Taking? Authorizing Provider  ALPRAZolam Duanne Moron) 1 MG tablet Take 0.5-1 mg by mouth 3 (three) times daily as needed for anxiety.    Yes [provider]  acyclovir (ZOVIRAX) 200 MG/5ML suspension Take 5 mLs (200 mg total) by mouth 5 (five) times daily. Patient not taking: Reported on 01/09/2019 11/27/18   Fontaine, Belinda Block, MD  albuterol (VENTOLIN HFA) 108 (90 Base) MCG/ACT inhaler Two puffs every 4 hours if needed for wheezing or coughing spells 12/05/18   Charlies Silvers, MD  cetirizine (ZYRTEC) 10 MG  tablet Take 10 mg by mouth at bedtime.     [provider]  Cholecalciferol (VITAMIN D3) 3000 units TABS Take 3,000 Units by mouth daily.    [provider]  clindamycin (CLEOCIN) 2 % vaginal cream Place 1 Applicatorful vaginally at bedtime. 01/09/19   Fontaine, Belinda Block, MD  EPINEPHrine (EPIPEN 2-PAK) 0.3 mg/0.3 mL IJ SOAJ injection Use as directed for severe allergic reaction. 09/18/16   Charlies Silvers, MD  estradiol (VIVELLE-DOT) 0.1 MG/24HR patch Place 1 patch (0.1 mg total) onto the skin 2 (two) times a week. 01/10/18   Fontaine, Belinda Block, MD  fexofenadine (ALLEGRA) 180 MG tablet Take 180 mg by mouth daily.    [provider]  fluconazole (DIFLUCAN) 150 MG tablet Take 1 tablet (150 mg total) by mouth daily. For 2 days 01/09/19   Fontaine, Belinda Block, MD  ipratropium-albuterol (DUONEB) 0.5-2.5 (3) MG/3ML SOLN Take 3 mLs by nebulization every 6 (six) hours as needed. 10/29/18   Coral Spikes, DO  medroxyPROGESTERone (PROVERA) 2.5 MG tablet Take 1 tablet (2.5 mg total) by mouth daily. Patient not taking: Reported on 01/09/2019 11/26/18   Fontaine, Belinda Block, MD  mometasone (NASONEX) 50 MCG/ACT nasal spray mometasone 50 mcg/actuation nasal spray  U 2 SPRAYS IEN ONCE A DAY FOR NASAL CONGESTION OR DRAINAGE    [provider]  nitroGLYCERIN (NITROSTAT) 0.3 MG SL tablet Place 0.3 mg under the tongue every 5 (five) minutes as needed for chest pain. Reported on 08/04/2015    [provider]  omeprazole (PRILOSEC) 40 MG capsule Take 40 mg by mouth 2 (two) times a day.    [provider]  predniSONE (DELTASONE) 10 MG tablet 50 mg daily x 2 days, then 40 mg daily x 2 days, then 30 mg daily x 2 days, then 20 mg daily x 2 days, then 10 mg daily x 2 days. Patient not taking: Reported on 01/09/2019 10/29/18   Coral Spikes, DO    Allergies as of 01/14/2019 - Review Complete 01/09/2019  Allergen Reaction Noted  . Latex Hives, Itching, and Rash 06/02/2015  .  Clonazepam Other (See Comments) 06/09/2017  . Escitalopram Swelling 01/01/2018  . Other  09/19/2018  . Codeine Nausea And Vomiting 12/23/2010  . Montelukast Other (See Comments) 05/17/2015    Family History  Problem Relation Age of Onset  . Cancer Maternal Uncle        2 MATERNAL UNCLES W COLON CA  . Heart disease Maternal Grandmother   . Hypertension Maternal Grandmother   . Cancer Maternal Grandfather        Lung  . Cancer Paternal Grandfather        Lung  . Myasthenia gravis Father   . Alzheimer's disease Father   . Healthy Mother   . Allergic rhinitis Neg Hx   . Angioedema Neg Hx   . Asthma Neg Hx   . Immunodeficiency Neg Hx   . Eczema Neg Hx   . Urticaria Neg Hx     Social History   Socioeconomic History  . Marital status: Married    Spouse name: Not on file  . Number of children: Not on file  . Years of education: Not on file  . Highest education level: Not on file  Occupational History  . Not on file  Social Needs  . Financial resource strain: Not on file  . Food insecurity    Worry: Not on file    Inability: Not on file  . Transportation needs    Medical: Not on file    Non-medical: Not on file  Tobacco Use  . Smoking status: Former Smoker    Packs/day: 1.00    Types: Cigarettes    Quit date: 08/13/2012    Years since quitting: 6.4  . Smokeless tobacco: Never Used  Substance and Sexual Activity  . Alcohol use: Yes    Comment: Wine daily  . Drug use: No  . Sexual activity: Yes    Partners: Male    Comment: VASECTOMY-1st intercourse 58 yo-Fewer than 5 partners  Lifestyle  . Physical activity    Days per week: Not on file    Minutes per session: Not on file  . Stress: Not on file  Relationships  . Social Herbalist on phone: Not on file    Gets together: Not on file    Attends religious service: Not on file    Active member of club or organization: Not on file    Attends meetings of clubs or organizations: Not on file     Relationship status: Not on file  . Intimate partner violence    Fear of current or ex partner: Not on file    Emotionally abused: Not on file    Physically abused: Not on file    Forced sexual activity: Not on file  Other Topics Concern  . Not on file  Social History Narrative  . Not on file    Review of Systems: See HPI, otherwise negative ROS  Physical Exam: LMP 04/12/2012  General:   Alert,  pleasant and cooperative in NAD Head:  Normocephalic and atraumatic. Neck:  Supple; no masses or thyromegaly. Lungs:  Clear throughout to auscultation, normal respiratory effort.    Heart:  +S1, +S2, Regular rate and rhythm, No edema. Abdomen:  Soft, nontender and nondistended. Normal bowel sounds, without guarding, and without rebound.   Neurologic:  Alert and  oriented x4;  grossly normal neurologically.  Impression/Plan: Kamiya L Groome is here for an EGD for dysphagia   Risks, benefits, limitations, and alternatives regarding the procedure have been reviewed with the patient.  Questions have been answered.  All parties agreeable.   Virgel Manifold, MD  01/23/2019, 9:10 AM

## 2019-01-23 NOTE — Anesthesia Procedure Notes (Signed)
Performed by: Cook-Martin, Tonia Avino Pre-anesthesia Checklist: Patient identified, Emergency Drugs available, Suction available, Patient being monitored and Timeout performed Patient Re-evaluated:Patient Re-evaluated prior to induction Oxygen Delivery Method: Nasal cannula Preoxygenation: Pre-oxygenation with 100% oxygen Induction Type: IV induction Airway Equipment and Method: Bite block Placement Confirmation: CO2 detector and positive ETCO2       

## 2019-01-23 NOTE — Anesthesia Preprocedure Evaluation (Signed)
Anesthesia Evaluation  Patient identified by MRN, date of birth, ID band Patient awake    Reviewed: Allergy & Precautions, H&P , NPO status , reviewed documented beta blocker date and time   Airway Mallampati: II  TM Distance: >3 FB Neck ROM: full    Dental  (+) Caps, Teeth Intact   Pulmonary asthma , former smoker,  Mild asthma, used inhaler prior to procedure   Pulmonary exam normal        Cardiovascular + angina + CAD  Normal cardiovascular exam  Negative cardiac w/u years ago   Neuro/Psych PSYCHIATRIC DISORDERS Anxiety Depression  Neuromuscular disease    GI/Hepatic hiatal hernia, GERD  ,  Endo/Other    Renal/GU      Musculoskeletal   Abdominal   Peds  Hematology   Anesthesia Other Findings Past Medical History: No date: Anxiety No date: Asthma     Comment:  allergy induced asthma No date: Depression No date: Environmental allergies No date: Hernia of abdominal wall No date: Herpes labialis No date: History of hiatal hernia No date: History of laparoscopic cholecystectomy     Comment:  2017 No date: History of myomectomy No date: Lumbar herniated disc No date: Panic attacks No date: Urinary incontinence No date: Urticaria  Past Surgical History: 2001: ABDOMINAL SURGERY     Comment:  MYOMECTOMY  2003: BLADDER SUSPENSION     Comment:  [place]or transobturator pubovaginal sling Dr Gaynelle Arabian 12/21/2010: BREAST BIOPSY; Right     Comment:  [place] 11/10/2015: CHOLECYSTECTOMY; N/A     Comment:  Procedure: LAPAROSCOPIC CHOLECYSTECTOMY;  Surgeon:               Florene Glen, MD;  Location: [facility] ORS;  Service:               General;  Laterality: N/A; No date: DILATION AND CURETTAGE OF UTERUS No date: HYSTEROSCOPY     Comment:  D&C, HYST X 3 12/26/2016: VENTRAL HERNIA REPAIR; N/A     Comment:  Procedure: HERNIA REPAIR VENTRAL ADULT;  Surgeon:               Florene Glen, MD;  Location: [facility] ORS;  Service:                General;  Laterality: N/A;     Reproductive/Obstetrics                             Anesthesia Physical Anesthesia Plan  ASA: II  Anesthesia Plan: General   Post-op Pain Management:    Induction: Intravenous  PONV Risk Score and Plan: 3 and Treatment may vary due to age or medical condition and TIVA  Airway Management Planned: Nasal Cannula and Natural Airway  Additional Equipment:   Intra-op Plan:   Post-operative Plan:   Informed Consent: I have reviewed the patients History and Physical, chart, labs and discussed the procedure including the risks, benefits and alternatives for the proposed anesthesia with the patient or authorized representative who has indicated his/her understanding and acceptance.     Dental Advisory Given  Plan Discussed with: CRNA  Anesthesia Plan Comments:         Anesthesia Quick Evaluation

## 2019-01-23 NOTE — Op Note (Signed)
[facility] Gastroenterology Patient Name: Hannah [name] Procedure Date: 01/23/2019 9:14 AM MRN: [id number] Account #: [id number] Date of Birth: 1961-02-12 Admit Type: Outpatient Age: 58 Room: [facility] ENDO ROOM 3 Gender: Female Note Status: Finalized Procedure:            Upper GI endoscopy Indications:          Dysphagia Providers:            Librado Guandique B. Bonna Gains MD, MD Medicines:            Monitored Anesthesia Care Complications:        No immediate complications. Procedure:            Pre-Anesthesia Assessment:                       - Prior to the procedure, a History and Physical was                        performed, and patient medications, allergies and                        sensitivities were reviewed. The patient's tolerance of                        previous anesthesia was reviewed.                       - The risks and benefits of the procedure and the                        sedation options and risks were discussed with the                        patient. All questions were answered and informed                        consent was obtained.                       - Patient identification and proposed procedure were                        verified prior to the procedure by the physician, the                        nurse, the anesthesiologist, the anesthetist and the                        technician. The procedure was verified in the procedure                        room.                       - ASA Grade Assessment: II - A patient with mild                        systemic disease.                       After obtaining informed consent, the endoscope was  passed under direct vision. Throughout the procedure,                        the patient's blood pressure, pulse, and oxygen                        saturations were monitored continuously. The Endoscope                        was introduced through the mouth, and advanced to the                        second part of duodenum. The upper GI endoscopy was                        accomplished with ease. The patient tolerated the                        procedure well. Findings:      The examined esophagus was normal. Biopsies were obtained from the       proximal and distal esophagus with cold forceps for histology of       suspected eosinophilic esophagitis.      There is no endoscopic evidence of stenosis or stricture in the entire       esophagus.      A small paraesophageal hernia was found.      The entire examined stomach was normal.      The duodenal bulb, second portion of the duodenum and examined duodenum       were normal. Impression:           - Normal esophagus. Biopsied.                       - Small paraesophageal hernia.                       - Normal stomach.                       - Normal duodenal bulb, second portion of the duodenum                        and examined duodenum. Recommendation:       - Await pathology results.                       - Discharge patient to home (with escort).                       - Advance diet as tolerated.                       - Continue present medications.                       - Patient has a contact number available for                        emergencies. The signs and symptoms of potential                        delayed complications were discussed with  the patient.                        Return to normal activities tomorrow. Written discharge                        instructions were provided to the patient.                       - Discharge patient to home (with escort).                       - The findings and recommendations were discussed with                        the patient.                       - The findings and recommendations were discussed with                        the patient's family.                       - Consider Manometry with pH monitoring if biopsies are                         negative for EoE                       - Refer to a surgeon for small paraesophageal hernia. Procedure Code(s):    --- Professional ---                       [phone], Esophagogastroduodenoscopy, flexible, transoral;                        with biopsy, single or multiple Diagnosis Code(s):    --- Professional ---                       K44.9, Diaphragmatic hernia without obstruction or                        gangrene                       R13.10, Dysphagia, unspecified CPT copyright 2019 American Medical Association. All rights reserved. The codes documented in this report are preliminary and upon coder review may  be revised to meet current compliance requirements.  Vonda Antigua, MD Margretta Sidle B. Bonna Gains MD, MD 01/23/2019 10:06:16 AM This report has been signed electronically. Number of Addenda: 0 Note Initiated On: 01/23/2019 9:14 AM Estimated Blood Loss: Estimated blood loss: none.      [facility]

## 2019-01-24 ENCOUNTER — Encounter: Payer: Self-pay | Admitting: Gynecology

## 2019-01-24 ENCOUNTER — Other Ambulatory Visit: Payer: Self-pay

## 2019-01-24 ENCOUNTER — Ambulatory Visit (INDEPENDENT_AMBULATORY_CARE_PROVIDER_SITE_OTHER): Payer: 59 | Admitting: Gynecology

## 2019-01-24 VITALS — BP 118/74 | Ht 64.0 in | Wt 169.0 lb

## 2019-01-24 DIAGNOSIS — Z9229 Personal history of other drug therapy: Secondary | ICD-10-CM | POA: Diagnosis not present

## 2019-01-24 DIAGNOSIS — Z1329 Encounter for screening for other suspected endocrine disorder: Secondary | ICD-10-CM | POA: Diagnosis not present

## 2019-01-24 DIAGNOSIS — N952 Postmenopausal atrophic vaginitis: Secondary | ICD-10-CM

## 2019-01-24 DIAGNOSIS — N898 Other specified noninflammatory disorders of vagina: Secondary | ICD-10-CM | POA: Diagnosis not present

## 2019-01-24 DIAGNOSIS — Z01419 Encounter for gynecological examination (general) (routine) without abnormal findings: Secondary | ICD-10-CM

## 2019-01-24 DIAGNOSIS — Z13220 Encounter for screening for lipoid disorders: Secondary | ICD-10-CM | POA: Diagnosis not present

## 2019-01-24 LAB — SURGICAL PATHOLOGY

## 2019-01-24 LAB — WET PREP FOR TRICH, YEAST, CLUE

## 2019-01-24 MED ORDER — FLUCONAZOLE 150 MG PO TABS: 150.0000 mg | ORAL_TABLET | Freq: Once | ORAL | 0 refills | Status: AC

## 2019-01-24 NOTE — Progress Notes (Signed)
    [name] Mar 26, 1961 [id number]        58 y.o.  [id number] for annual gynecologic exam.  Notes a little bit of vaginal itching.  Was treated by telemedicine with Diflucan and notes her symptoms have improved but still a lingering itch.  Past medical history,surgical history, problem list, medications, allergies, family history and social history were all reviewed and documented as reviewed in the EPIC chart.  ROS:  Performed with pertinent positives and negatives included in the history, assessment and plan.   Additional significant findings : None   Exam: Hannah [name] assistant Vitals:   01/24/19 1323  BP: 118/74  Weight: 169 lb (76.7 kg)  Height: 5\' 4"  (1.626 m)   Body mass index is 29.01 kg/m.  General appearance:  Normal affect, orientation and appearance. Skin: Grossly normal HEENT: Without gross lesions.  No cervical or supraclavicular adenopathy. Thyroid normal.  Lungs:  Clear without wheezing, rales or rhonchi Cardiac: RR, without RMG Abdominal:  Soft, nontender, without masses, guarding, rebound, organomegaly or hernia Breasts:  Examined lying and sitting without masses, retractions, discharge or axillary adenopathy. Pelvic:  Ext, BUS, Vagina: Normal with atrophic changes.  Bladder sling palpable submucosally anterior vaginal wall  Cervix: Normal with atrophic changes  Uterus: Anteverted, normal size, shape and contour, midline and mobile nontender   Adnexa: Without masses or tenderness    Anus and perineum: Normal   Rectovaginal: Normal sphincter tone without palpated masses or tenderness.    Assessment/Plan:  58 y.o. [id number] female for annual gynecologic exam.   1. Lingering vaginal itching.  Wet prep is negative.  Will cover with 1 additional dose of Diflucan 150 mg.  We will follow-up if it continues. 2. Postmenopausal/HRT.  On Vivelle 0.1 mg and Prometrium 100 mg.  Is having a lot of esophageal issues and cannot swallow the Prometrium.  We switched her  to Provera 2.5 mg that she can crush and she is starting on that now.  We discussed the risks of HRT multiple times and she is comfortable with the thrombosis and breast cancer issues.  No bleeding and she knows to call if any bleeding. 3. Mammography due now and she will follow-up for this.  Breast exam normal today. 4. Pap smear/HPV 12/2017.  No history of abnormal Pap smears previously.  Plan follow-up Pap smear/HPV at 5-year interval per current screening guidelines. 5. DEXA never.  We will plan at age 89. 73. Colonoscopy 2015.  Repeat at their recommended interval. 7. Health maintenance.  Request baseline labs.  CBC, CMP, lipid profile and TSH ordered.  Follow-up 1 year, sooner as needed.   Hannah Auerbach MD, 2:27 PM 01/24/2019

## 2019-01-24 NOTE — Patient Instructions (Signed)
Take the Diflucan pill.  Follow-up if itching continues.  Follow-up in 1 year for annual exam

## 2019-01-25 LAB — CBC WITH DIFFERENTIAL/PLATELET
Absolute Monocytes: 800 cells/uL (ref 200–950)
Basophils Absolute: 64 cells/uL (ref 0–200)
Basophils Relative: 0.7 %
Eosinophils Absolute: 74 cells/uL (ref 15–500)
Eosinophils Relative: 0.8 %
HCT: 40.7 % (ref 35.0–45.0)
Hemoglobin: 14.1 g/dL (ref 11.7–15.5)
Lymphs Abs: 1812 cells/uL (ref 850–3900)
MCH: 31.2 pg (ref 27.0–33.0)
MCHC: 34.6 g/dL (ref 32.0–36.0)
MCV: 90 fL (ref 80.0–100.0)
MPV: 11 fL (ref 7.5–12.5)
Monocytes Relative: 8.7 %
Neutro Abs: 6449 cells/uL (ref 1500–7800)
Neutrophils Relative %: 70.1 %
Platelets: 351 10*3/uL (ref 140–400)
RBC: 4.52 10*6/uL (ref 3.80–5.10)
RDW: 12.4 % (ref 11.0–15.0)
Total Lymphocyte: 19.7 %
WBC: 9.2 10*3/uL (ref 3.8–10.8)

## 2019-01-25 LAB — COMPREHENSIVE METABOLIC PANEL
AG Ratio: 1.5 (calc) (ref 1.0–2.5)
ALT: 9 U/L (ref 6–29)
AST: 16 U/L (ref 10–35)
Albumin: 4.1 g/dL (ref 3.6–5.1)
Alkaline phosphatase (APISO): 54 U/L (ref 37–153)
BUN/Creatinine Ratio: 9 (calc) (ref 6–22)
BUN: 6 mg/dL — ABNORMAL LOW (ref 7–25)
CO2: 23 mmol/L (ref 20–32)
Calcium: 9.5 mg/dL (ref 8.6–10.4)
Chloride: 104 mmol/L (ref 98–110)
Creat: 0.7 mg/dL (ref 0.50–1.05)
Globulin: 2.7 g/dL (calc) (ref 1.9–3.7)
Glucose, Bld: 101 mg/dL — ABNORMAL HIGH (ref 65–99)
Potassium: 4 mmol/L (ref 3.5–5.3)
Sodium: 138 mmol/L (ref 135–146)
Total Bilirubin: 0.7 mg/dL (ref 0.2–1.2)
Total Protein: 6.8 g/dL (ref 6.1–8.1)

## 2019-01-25 LAB — LIPID PANEL
Cholesterol: 227 mg/dL — ABNORMAL HIGH (ref <200)
HDL: 113 mg/dL (ref >=50)
LDL Cholesterol (Calc): 86 mg/dL (calc)
Non-HDL Cholesterol (Calc): 114 mg/dL (calc) (ref <130)
Total CHOL/HDL Ratio: 2 (calc) (ref <5.0)
Triglycerides: 187 mg/dL — ABNORMAL HIGH (ref <150)

## 2019-01-25 LAB — TSH: TSH: 1.9 mIU/L

## 2019-01-28 ENCOUNTER — Encounter: Payer: Self-pay | Admitting: Gynecology

## 2019-01-31 ENCOUNTER — Other Ambulatory Visit: Payer: Self-pay | Admitting: Gynecology

## 2019-02-07 ENCOUNTER — Other Ambulatory Visit: Payer: Self-pay

## 2019-02-07 DIAGNOSIS — K449 Diaphragmatic hernia without obstruction or gangrene: Secondary | ICD-10-CM

## 2019-02-10 ENCOUNTER — Ambulatory Visit: Payer: 59 | Admitting: Gastroenterology

## 2019-02-11 ENCOUNTER — Other Ambulatory Visit: Payer: Self-pay | Admitting: *Deleted

## 2019-02-11 DIAGNOSIS — R69 Illness, unspecified: Secondary | ICD-10-CM | POA: Diagnosis not present

## 2019-02-11 MED ORDER — EPINEPHRINE 0.3 MG/0.3ML IJ SOAJ: INTRAMUSCULAR | 0 refills | Status: DC

## 2019-02-13 ENCOUNTER — Other Ambulatory Visit: Payer: Self-pay | Admitting: Pediatrics

## 2019-02-17 ENCOUNTER — Other Ambulatory Visit: Payer: Self-pay

## 2019-02-17 DIAGNOSIS — Z86010 Personal history of colonic polyps: Secondary | ICD-10-CM

## 2019-02-19 ENCOUNTER — Ambulatory Visit: Payer: 59 | Admitting: Surgery

## 2019-02-19 ENCOUNTER — Other Ambulatory Visit: Payer: Self-pay

## 2019-02-19 ENCOUNTER — Encounter: Payer: Self-pay | Admitting: Surgery

## 2019-02-19 VITALS — BP 120/78 | HR 100 | Temp 97.3°F | Ht 64.0 in | Wt 171.0 lb

## 2019-02-19 DIAGNOSIS — K449 Diaphragmatic hernia without obstruction or gangrene: Secondary | ICD-10-CM

## 2019-02-19 NOTE — Patient Instructions (Addendum)
The patient is aware to call back for any questions or new concerns.  Monitor reflux symptoms and record identifying factors or foods.  Schedule CT abdomen/pelvis and chest. This is scheduled for 03-04-19 at 9:30 am (arrive 9:15 am). Prep: nothing to eat or drink 4 hours prior and pick up prep kit.   Follow up with Dr. Dahlia Byes as scheduled.

## 2019-02-20 ENCOUNTER — Encounter: Payer: Self-pay | Admitting: Surgery

## 2019-02-20 NOTE — Progress Notes (Signed)
Patient ID: Hannah [name], female   DOB: 1961/04/19, 58 y.o.   MRN: [id number]  HPI Hannah [name] is a 58 y.o. female seen in consultation at the request of Dr. Bonna Gains a paraesophageal hernia.  Reports significant reflux symptoms.  She does report some acid taste in the morning and dry cough.  She stated that PPIs have helped somewhat.  Of note she does have some dysphasia for solids.  She feels that food gets stuck within her chest.  No evidence of food impaction.  I have personally reviewed the patient's imaging, barium swallow shows evidence of moderate reflux.  No esophageal stricture.  Also personally reviewed her EGD showing evidence of patient will either Morgagni vs paraesophageal vs Bochdalek, .CBC and CMP are nml.  SHe is able to perform 4 METS of activity without shortness of breath or chest pain.    HPI  Past Medical History:  Diagnosis Date  . Anxiety   . Asthma    allergy induced asthma  . Depression   . Environmental allergies   . Hernia of abdominal wall   . Herpes labialis   . History of hiatal hernia   . History of laparoscopic cholecystectomy    2017  . History of myomectomy   . Lumbar herniated disc   . Panic attacks   . Urinary incontinence   . Urticaria     Past Surgical History:  Procedure Laterality Date  . ABDOMINAL SURGERY  2001   MYOMECTOMY   . BLADDER SUSPENSION  2003   [facility] transobturator pubovaginal sling Dr Gaynelle Arabian  . BREAST BIOPSY Right 12/21/2010   [place]  . CHOLECYSTECTOMY N/A 11/10/2015   Procedure: LAPAROSCOPIC CHOLECYSTECTOMY;  Surgeon: Florene Glen, MD;  Location: [facility];  Service: General;  Laterality: N/A;  . COLONOSCOPY  2015   Dr Regino Bellow  . DILATION AND CURETTAGE OF UTERUS    . ESOPHAGOGASTRODUODENOSCOPY (EGD) WITH PROPOFOL N/A 01/23/2019   Procedure: ESOPHAGOGASTRODUODENOSCOPY (EGD) WITH PROPOFOL;  Surgeon: Virgel Manifold, MD;  Location: [facility];  Service: Endoscopy;  Laterality: N/A;  .  HYSTEROSCOPY     D&C, HYST X 3  . VENTRAL HERNIA REPAIR N/A 12/26/2016   Procedure: HERNIA REPAIR VENTRAL ADULT;  Surgeon: Florene Glen, MD;  Location: [facility];  Service: General;  Laterality: N/A;    Family History  Problem Relation Age of Onset  . Cancer Maternal Uncle        2 MATERNAL UNCLES W COLON CA  . Heart disease Maternal Grandmother   . Hypertension Maternal Grandmother   . Cancer Maternal Grandfather        Lung  . Cancer Paternal Grandfather        Lung  . Myasthenia gravis Father   . Alzheimer's disease Father   . Healthy Mother   . Allergic rhinitis Neg Hx   . Angioedema Neg Hx   . Asthma Neg Hx   . Immunodeficiency Neg Hx   . Eczema Neg Hx   . Urticaria Neg Hx     Social History Social History   Tobacco Use  . Smoking status: Former Smoker    Packs/day: 1.00    Types: Cigarettes    Quit date: 08/13/2012    Years since quitting: 6.5  . Smokeless tobacco: Never Used  Substance Use Topics  . Alcohol use: Yes    Comment: Wine daily  . Drug use: No    Allergies  Allergen Reactions  . Latex Hives, Itching and Rash  no breathing issues   . Clonazepam Other (See Comments)    Numb  . Escitalopram Swelling  . Other   . Codeine Nausea And Vomiting    SENSITIVE  . Montelukast Other (See Comments)    nightmares    Current Outpatient Medications  Medication Sig Dispense Refill  . acyclovir (ZOVIRAX) 200 MG/5ML suspension Take 5 mLs (200 mg total) by mouth 5 (five) times daily. 120 mL 3  . albuterol (VENTOLIN HFA) 108 (90 Base) MCG/ACT inhaler INHALE 2 PUFFS BY MOUTH EVERY 4 HOURS AS NEEDED FOR WHEEZING OR COUGHING SPELLS 8.5 g 0  . ALPRAZolam (XANAX) 1 MG tablet Take 0.5-1 mg by mouth 3 (three) times daily as needed for anxiety.     . cetirizine (ZYRTEC) 10 MG tablet Take 10 mg by mouth at bedtime.     . Cholecalciferol (VITAMIN D3) 3000 units TABS Take 3,000 Units by mouth daily.    Marland Kitchen EPINEPHrine (EPIPEN 2-PAK) 0.3 mg/0.3 mL IJ SOAJ injection  Use as directed for severe allergic reaction. 2 each 0  . estradiol (VIVELLE-DOT) 0.1 MG/24HR patch APPLY 1 PATCH(0.1 MG) EXTERNALLY TO THE SKIN 2 TIMES A WEEK 8 patch 11  . fexofenadine (ALLEGRA) 180 MG tablet Take 180 mg by mouth daily.    Marland Kitchen ipratropium-albuterol (DUONEB) 0.5-2.5 (3) MG/3ML SOLN Take 3 mLs by nebulization every 6 (six) hours as needed. 90 mL 3  . medroxyPROGESTERone (PROVERA) 2.5 MG tablet Take 2.5 mg by mouth daily.    . mometasone (NASONEX) 50 MCG/ACT nasal spray mometasone 50 mcg/actuation nasal spray  U 2 SPRAYS IEN ONCE A DAY FOR NASAL CONGESTION OR DRAINAGE    . nitroGLYCERIN (NITROSTAT) 0.3 MG SL tablet Place 0.3 mg under the tongue every 5 (five) minutes as needed for chest pain. Reported on 08/04/2015    . omeprazole (PRILOSEC) 40 MG capsule Take 40 mg by mouth 2 (two) times a day.     No current facility-administered medications for this visit.      Review of Systems Full ROS  was asked and was negative except for the information on the HPI  Physical Exam Blood pressure 120/78, pulse 100, temperature (!) 97.3 F (36.3 C), temperature source Temporal, height 5\' 4"  (1.626 m), weight 171 lb (77.6 kg), last menstrual period 04/12/2012, SpO2 98 %. CONSTITUTIONAL: NAD  EYES: Pupils are equal, round, and reactive to light, Sclera are non-icteric. EARS, NOSE, MOUTH AND THROAT: The oropharynx is clear. The oral mucosa is pink and moist. Hearing is intact to voice. LYMPH NODES:  Lymph nodes in the neck are normal. RESPIRATORY:  Lungs are clear. There is normal respiratory effort, with equal breath sounds bilaterally, and without pathologic use of accessory muscles. CARDIOVASCULAR: Heart is regular without murmurs, gallops, or rubs. GI: The abdomen is  soft, nontender, and nondistended. There are no palpable masses. There is no hepatosplenomegaly. There are normal bowel sounds in all quadrants. GU: Rectal deferred.   MUSCULOSKELETAL: Normal muscle strength and tone. No  cyanosis or edema.   SKIN: Turgor is good and there are no pathologic skin lesions or ulcers. NEUROLOGIC: Motor and sensation is grossly normal. Cranial nerves are grossly intact. PSYCH:  Oriented to person, place and time. Affect is normal.   Assessment/Plan 58 year old female with significant reflux symptoms that are recalcitrant. She   Does have some dysphagia and work-up so far is equivocal for paraesophageal hernia.  Endoscopic features are more consistent with morgani versus Bochdalek We will obtain a CT scan of the chest  abdomen and pelvis to delineate the diaphragmatic as well as the mediastinal anatomy.  I think that she will likely benefit from fundoplication to give her her symptoms.  If we in fact find a diaphragmatic defect I do think that this is an indication for repair as well.  See her back in a few weeks with results  Copy of this report was sent to the referring provider  Caroleen Hamman, MD FACS General Surgeon 02/20/2019, 2:22 PM

## 2019-02-26 ENCOUNTER — Other Ambulatory Visit: Payer: Self-pay

## 2019-02-26 DIAGNOSIS — K449 Diaphragmatic hernia without obstruction or gangrene: Secondary | ICD-10-CM

## 2019-02-28 ENCOUNTER — Ambulatory Visit: Payer: 59

## 2019-03-03 ENCOUNTER — Encounter: Payer: Self-pay | Admitting: Gynecology

## 2019-03-03 NOTE — Telephone Encounter (Signed)
Recommend scheduling sonohysterogram for endometrial assessment.  We did this once before 07/2017 but unfortunately has been too long to rely on that information.

## 2019-03-04 ENCOUNTER — Ambulatory Visit
Admission: RE | Admit: 2019-03-04 | Discharge: 2019-03-04 | Disposition: A | Discharge status: Home / Self Care | Payer: 59 | Source: Ambulatory Visit | Attending: Surgery | Admitting: Surgery

## 2019-03-04 ENCOUNTER — Telehealth: Payer: Self-pay | Admitting: *Deleted

## 2019-03-04 ENCOUNTER — Other Ambulatory Visit: Payer: Self-pay

## 2019-03-04 DIAGNOSIS — K449 Diaphragmatic hernia without obstruction or gangrene: Secondary | ICD-10-CM | POA: Diagnosis not present

## 2019-03-04 DIAGNOSIS — K219 Gastro-esophageal reflux disease without esophagitis: Secondary | ICD-10-CM | POA: Diagnosis not present

## 2019-03-04 DIAGNOSIS — K439 Ventral hernia without obstruction or gangrene: Secondary | ICD-10-CM | POA: Diagnosis not present

## 2019-03-04 MED ORDER — IOHEXOL 300 MG/ML  SOLN
100.0000 mL | Freq: Once | INTRAMUSCULAR | Status: AC | PRN
  Administered 2019-03-04: 100 mL via INTRAVENOUS

## 2019-03-04 NOTE — Telephone Encounter (Signed)
Patient called back and is now Notified patient as instructed, patient agrees. She is aware of virtual appointment on Monday 03/10/19

## 2019-03-04 NOTE — Telephone Encounter (Signed)
Left message for  Patient to call us back . She has a ventral hernia. No other findings on CT. Follow up as scheduled

## 2019-03-05 ENCOUNTER — Other Ambulatory Visit: Payer: Self-pay | Admitting: Gynecology

## 2019-03-05 ENCOUNTER — Telehealth: Payer: Self-pay | Admitting: *Deleted

## 2019-03-05 ENCOUNTER — Encounter: Payer: Self-pay | Admitting: Gynecology

## 2019-03-05 ENCOUNTER — Ambulatory Visit: Payer: 59 | Admitting: Gynecology

## 2019-03-05 VITALS — BP 124/82

## 2019-03-05 DIAGNOSIS — N939 Abnormal uterine and vaginal bleeding, unspecified: Secondary | ICD-10-CM

## 2019-03-05 DIAGNOSIS — Z79890 Hormone replacement therapy (postmenopausal): Secondary | ICD-10-CM | POA: Diagnosis not present

## 2019-03-05 DIAGNOSIS — N950 Postmenopausal bleeding: Secondary | ICD-10-CM | POA: Diagnosis not present

## 2019-03-05 DIAGNOSIS — R1031 Right lower quadrant pain: Secondary | ICD-10-CM | POA: Diagnosis not present

## 2019-03-05 LAB — CBC WITH DIFFERENTIAL/PLATELET
Absolute Monocytes: 824 cells/uL (ref 200–950)
Basophils Absolute: 43 cells/uL (ref 0–200)
Basophils Relative: 0.4 %
Eosinophils Absolute: 150 cells/uL (ref 15–500)
Eosinophils Relative: 1.4 %
HCT: 38.3 % (ref 35.0–45.0)
Hemoglobin: 13 g/dL (ref 11.7–15.5)
Lymphs Abs: 2268 cells/uL (ref 850–3900)
MCH: 31.1 pg (ref 27.0–33.0)
MCHC: 33.9 g/dL (ref 32.0–36.0)
MCV: 91.6 fL (ref 80.0–100.0)
MPV: 10.6 fL (ref 7.5–12.5)
Monocytes Relative: 7.7 %
Neutro Abs: 7415 cells/uL (ref 1500–7800)
Neutrophils Relative %: 69.3 %
Platelets: 391 10*3/uL (ref 140–400)
RBC: 4.18 10*6/uL (ref 3.80–5.10)
RDW: 12.8 % (ref 11.0–15.0)
Total Lymphocyte: 21.2 %
WBC: 10.7 10*3/uL (ref 3.8–10.8)

## 2019-03-05 MED ORDER — MEGESTROL ACETATE 20 MG PO TABS: 20.0000 mg | ORAL_TABLET | Freq: Every day | ORAL | 0 refills | Status: DC

## 2019-03-05 NOTE — Progress Notes (Signed)
    Hannah [name] Nov 06, 1960 [id number]        58 y.o.  [id number] presents having called earlier with spotting on and off over the past month.  She is on HRT to include Vivelle 0.1 mg patch and originally Prometrium 100 mg.  Was having a lot of upper GI issues ultimately leading to a stretching of her esophagus.  She could not swallow pills and we switched her to medroxyprogesterone 2.5 mg that she could crush.  She had been off of the Prometrium though for several weeks proceeding.  Over the last several days she has had the onset of right lower quadrant pain sharp stabbing cramping coming and going.  Still is limited in what she can eat but taking fluids with some solid food.  No nausea or vomiting.  Having some diarrhea but no constipation.  Had CT scan of the chest and upper abdomen yesterday but unfortunately they did not look in the lower abdomen/pelvis.  The CT scan was negative except for a small ventral upper hernia with fat.  No fever or chills.  No urinary symptoms such as frequency dysuria urgency  Past medical history,surgical history, problem list, medications, allergies, family history and social history were all reviewed and documented in the EPIC chart.  Directed ROS with pertinent positives and negatives documented in the history of present illness/assessment and plan.  Exam: [name] assistant Vitals:   03/05/19 1217  BP: 124/82   General appearance:  Normal Abdomen soft with tenderness in the right lower quadrant McBurney's point.  No rebound or guarding.  Active bowel sounds. Pelvic external BUS vagina with atrophic changes.  Cervix normal.  Uterus grossly normal midline mobile nontender to manipulation.  Adnexa without gross masses or tenderness.  Assessment/Plan:  58 y.o. [id number] with spotting on and off occasional passage of clots over the past month on HRT.  Switched from Prometrium to medroxyprogesterone.  Had called with this earlier and we were in the process of  arranging a sonohysterogram.  Now with several days of right lower quadrant pain.  Discussed differential to include GI, GU and GYN.  No gross palpable pelvic masses or other pathology.  No uterine tenderness to manipulation.  Discussed possible ovarian cyst although unlikely in postmenopausal patient.  Also reviewed torsion but again unlikely given lack of significant discomfort on exam.  Will suppress bleeding with Megace 20 mg daily and she will hold on the medroxyprogesterone.  We will follow-up for the ultrasound sonohysterogram to look at the ovaries as well as for endometrial assessment.  She will take Tylenol liquid that she can tolerate for now.    [name] MD, 12:46 PM 03/05/2019

## 2019-03-05 NOTE — Telephone Encounter (Signed)
Patient called today c/o terrible pelvic pain has been communicating via my chart (03/03/19) regarding bleeding. Patient thought her visit today would be for [facility] however it is not, scheduled for pain visit only. I explained to her no tech here in the office today, per Dr. Phineas Real did order [facility] ,but when patient called and spoke with appointment desk she did not mention this per appointment scheduler. I told her Dr. Phineas Real can still see her for exam, just not [facility], but will order for future, patient wasn't sure if she should keep the appointment because no imaging can be done today,asked if she should go to ER, I told her that was a personal choice, but I was not saying we will not see her today. Patient decided she will keep the appointment.

## 2019-03-05 NOTE — Patient Instructions (Signed)
Follow-up for the ultrasound as scheduled. 

## 2019-03-05 NOTE — Addendum Note (Signed)
Addended by: [name] on: 03/05/2019 12:53 PM   Modules accepted: Orders

## 2019-03-07 LAB — URINALYSIS, COMPLETE W/RFL CULTURE
Bilirubin Urine: NEGATIVE
Glucose, UA: NEGATIVE
Hyaline Cast: NONE SEEN /LPF
Nitrites, Initial: NEGATIVE
Protein, ur: NEGATIVE
Specific Gravity, Urine: 1.024 (ref 1.001–1.03)
pH: 6 (ref 5.0–8.0)

## 2019-03-07 LAB — CULTURE INDICATED

## 2019-03-07 LAB — URINE CULTURE
MICRO NUMBER:: 696724
Result:: NO GROWTH
SPECIMEN QUALITY:: ADEQUATE

## 2019-03-10 ENCOUNTER — Other Ambulatory Visit: Payer: Self-pay

## 2019-03-10 ENCOUNTER — Telehealth (INDEPENDENT_AMBULATORY_CARE_PROVIDER_SITE_OTHER): Payer: 59 | Admitting: Surgery

## 2019-03-10 DIAGNOSIS — K219 Gastro-esophageal reflux disease without esophagitis: Secondary | ICD-10-CM | POA: Diagnosis not present

## 2019-03-11 NOTE — Progress Notes (Signed)
Telemedicine Surgical Follow Up  03/11/2019  Hannah [name] is an 58 y.o. female.   No chief complaint on file.   HPI:  Location of the patient:Home Time spent on call: 70min Total Time spent in the encounter including counseling and coordination of care: 25 Location of the Provider:Office The patient had given consent for a telemedicine visit and they understands the limitations associated with this including but not limited to privacy, cyber security and technology issues. Persons participating in the visit: Patient  Hannah [name] is a 58 year old female well-known to me with history of significant reflux disease.  We obtain a CT scan of the chest and abdomen to delineate the diaphragmatic anatomy.  I have personally reviewed both the CT of the chest and abdomen as well as the barium swallow.  There is significant reflux in the barium swallow but there is no evidence of any paraesophageal or hiatal hernia at this time.  There is no evidence of any diaphragmatic hernia either. He tells me that she is got some GYN bleeding and currently is being worked up and having an ultrasound performed.  Continues to have reflux symptoms but they are not as severe as before. She denies any fevers any chills or any recent pneumonias. Had a extensive discussion with the patient and currently at this time she wishes to hold off of any surgical intervention for reflux and wants to concentrate in the further work-up for her Gyn lead.  I will see her back in the office in about 6 to 8 months and at that time we can reconvene and discuss further about her reflux issues.   Past Medical History:  Diagnosis Date  . Anxiety   . Asthma    allergy induced asthma  . Depression   . Environmental allergies   . Hernia of abdominal wall   . Herpes labialis   . History of hiatal hernia   . History of laparoscopic cholecystectomy    2017  . History of myomectomy   . Lumbar herniated disc   . Panic attacks   . Urinary  incontinence   . Urticaria     Past Surgical History:  Procedure Laterality Date  . ABDOMINAL SURGERY  2001   MYOMECTOMY   . BLADDER SUSPENSION  2003   [place] transobturator pubovaginal sling Dr Gaynelle Arabian  . BREAST BIOPSY Right 12/21/2010   [place]  . CHOLECYSTECTOMY N/A 11/10/2015   Procedure: LAPAROSCOPIC CHOLECYSTECTOMY;  Surgeon: Florene Glen, MD;  Location: [facility] OR[facility];  Service: General;  Laterality: N/A;  . COLONOSCOPY  2015   Dr Regino Bellow  . DILATION AND CURETTAGE OF UTERUS    . ESOPHAGOGASTRODUODENOSCOPY (EGD) WITH PROPOFOL N/A 01/23/2019   Procedure: ESOPHAGOGASTRODUODENOSCOPY (EGD) WITH PROPOFOL;  Surgeon: Virgel Manifold, MD;  Location: [facility] END[facility];  Service: Endoscopy;  Laterality: N/A;  . HYSTEROSCOPY     D&C, HYST X 3  . VENTRAL HERNIA REPAIR N/A 12/26/2016   Procedure: HERNIA REPAIR VENTRAL ADULT;  Surgeon: Florene Glen, MD;  Location: [facility] ORS;  Service: General;  Laterality: N/A;    Family History  Problem Relation Age of Onset  . Cancer Maternal Uncle        2 MATERNAL UNCLES W COLON CA  . Heart disease Maternal Grandmother   . Hypertension Maternal Grandmother   . Cancer Maternal Grandfather        Lung  . Cancer Paternal Grandfather        Lung  . Myasthenia gravis Father   .  Alzheimer's disease Father   . Healthy Mother   . Allergic rhinitis Neg Hx   . Angioedema Neg Hx   . Asthma Neg Hx   . Immunodeficiency Neg Hx   . Eczema Neg Hx   . Urticaria Neg Hx     Social History:  reports that she quit smoking about 6 years ago. Her smoking use included cigarettes. She smoked 1.00 pack per day. She has never used smokeless tobacco. She reports current alcohol use. She reports that she does not use drugs.  Allergies:  Allergies  Allergen Reactions  . Latex Hives, Itching and Rash    no breathing issues   . Clonazepam Other (See Comments)    Numb  . Escitalopram Swelling  . Other   . Codeine Nausea And Vomiting    SENSITIVE  .  Montelukast Other (See Comments)    nightmares    Medications reviewed.    ROS Full ROS performed and is otherwise negative other than what is stated in HPI   Assessment/Plan: Gastroesophageal reflux disease.  We will see her back in about 6 to 8 months for another follow-up.  At this time the patient wants to address the GYN issues first The pt was provided an opportunity to ask questions and all were answered. The pt was advised to call back or seek in-person evaluation if the symptoms worsen or if the condition fails to improve as anticipated. Greater than 50% of the 25 minutes  visit was spent in counseling/coordination of care   Caroleen Hamman, MD [facility] Surgeon

## 2019-03-19 ENCOUNTER — Other Ambulatory Visit: Payer: Self-pay

## 2019-03-20 ENCOUNTER — Ambulatory Visit: Payer: 59

## 2019-03-20 ENCOUNTER — Other Ambulatory Visit: Payer: Self-pay | Admitting: Gynecology

## 2019-03-20 ENCOUNTER — Ambulatory Visit (INDEPENDENT_AMBULATORY_CARE_PROVIDER_SITE_OTHER): Payer: 59 | Admitting: Gynecology

## 2019-03-20 ENCOUNTER — Encounter: Payer: Self-pay | Admitting: Gynecology

## 2019-03-20 VITALS — BP 132/80

## 2019-03-20 DIAGNOSIS — N950 Postmenopausal bleeding: Secondary | ICD-10-CM

## 2019-03-20 DIAGNOSIS — Z79890 Hormone replacement therapy (postmenopausal): Secondary | ICD-10-CM

## 2019-03-20 DIAGNOSIS — R1031 Right lower quadrant pain: Secondary | ICD-10-CM

## 2019-03-20 DIAGNOSIS — N859 Noninflammatory disorder of uterus, unspecified: Secondary | ICD-10-CM | POA: Diagnosis not present

## 2019-03-20 NOTE — Patient Instructions (Signed)
Office will call you with biopsy results 

## 2019-03-20 NOTE — Addendum Note (Signed)
Addended by: Thurnell Garbe A on: 03/20/2019 12:45 PM   Modules accepted: Orders

## 2019-03-20 NOTE — Progress Notes (Signed)
    Hannah [name] [birth year] [id number]        58 y.o.  [id number] presents for sonohysterogram.  History of spotting on and off on HRT.  She was on Vivelle 0.1 mg patch and Prometrium 100 mg.  She was having a lot of GI issues to include having her esophagus stretched.  She could not swallow pills and we converted her to medroxyprogesterone 2.5 mg that she could crush.  She also had the onset of right-sided pain.  She was evaluated 03/05/2019 with a negative exam.  Stopped her progesterone then and has done no bleeding since.  She notes that her right-sided pain has resolved since then.  Past medical history,surgical history, problem list, medications, allergies, family history and social history were all reviewed and documented in the EPIC chart.  Directed ROS with pertinent positives and negatives documented in the history of present illness/assessment and plan.  Exam: Ivin Booty assistant BP 132/80 General appearance:  Normal Abdomen soft nontender without masses guarding rebound Pelvic external BUS vagina with mild atrophic changes.  Cervix normal.  Uterus normal size midline mobile nontender.  Adnexa without masses or tenderness.  Ultrasound transvaginal shows uterus normal size with inhomogeneous myometrium suggesting adenomyosis.  2 small myomas noted at 26 and 22 mm.  Endometrial echo 5.5 mm with some cystic changes.  Right and left ovaries visualized and atrophic.  Cul-de-sac negative.  Sonohysterogram performed, sterile technique, easy catheter introduction, good distention with irregular walls and some cystic changes..  Endometrial biopsy taken.  Patient tolerated well.  Assessment/Plan:  58 y.o. [id number] with spotting on HRT.  Now resolved.  Right-sided pain now resolved.  Ultrasound shows some cystic changes within the endometrium but no definitive polyps or other abnormalities.  Endometrial biopsy taken.  Patient will follow-up for these results.  We discussed that after review of her  endometrial biopsy results will decide as to the appropriate hormonal regiment.  She will stay on the estradiol patch for now   [name] MD, 11:50 AM 03/20/2019

## 2019-03-24 LAB — PATHOLOGY REPORT

## 2019-03-24 LAB — TISSUE SPECIMEN

## 2019-03-25 ENCOUNTER — Other Ambulatory Visit: Payer: Self-pay

## 2019-03-25 MED ORDER — MEDROXYPROGESTERONE ACETATE 2.5 MG PO TABS: 2.5000 mg | ORAL_TABLET | Freq: Every day | ORAL | 3 refills | Status: DC

## 2019-03-31 ENCOUNTER — Telehealth: Payer: Self-pay

## 2019-03-31 NOTE — Telephone Encounter (Signed)
Patient has returned call to reschedule her colonoscopy with Dr. Bonna Gains.  She has been rescheduled to 05/23/19.  Informed her of her new COVID test date 05/20/19 Tues.  Trish in Endo has been informed of date change.  Thanks [employer]

## 2019-03-31 NOTE — Telephone Encounter (Signed)
LVM for patient to call the office to reschedule her 05/09/19 colonoscopy with Dr. Bonna Gains due to Dr. Bonna Gains will not be here.  She is an established patient of Dr. Michele Mcalpine, and will need to be rescheduled with her.  Thanks [employer]

## 2019-03-31 NOTE — Telephone Encounter (Signed)
Colonoscopy rescheduled to 05/30/19 Bonna Gains is on vacation until 05/23/19.  Thanks [employer]

## 2019-04-02 DIAGNOSIS — D225 Melanocytic nevi of trunk: Secondary | ICD-10-CM | POA: Diagnosis not present

## 2019-04-02 DIAGNOSIS — L814 Other melanin hyperpigmentation: Secondary | ICD-10-CM | POA: Diagnosis not present

## 2019-04-02 DIAGNOSIS — Z86018 Personal history of other benign neoplasm: Secondary | ICD-10-CM | POA: Diagnosis not present

## 2019-04-04 ENCOUNTER — Ambulatory Visit
Admission: RE | Admit: 2019-04-04 | Discharge: 2019-04-04 | Disposition: A | Discharge status: Home / Self Care | Payer: 59 | Source: Ambulatory Visit | Attending: Gynecology | Admitting: Gynecology

## 2019-04-04 ENCOUNTER — Other Ambulatory Visit: Payer: Self-pay

## 2019-04-04 DIAGNOSIS — Z1231 Encounter for screening mammogram for malignant neoplasm of breast: Secondary | ICD-10-CM

## 2019-05-07 ENCOUNTER — Encounter: Payer: Self-pay | Admitting: Gynecology

## 2019-05-22 ENCOUNTER — Telehealth: Payer: Self-pay | Admitting: Gastroenterology

## 2019-05-22 NOTE — Telephone Encounter (Signed)
Pt left vm she has a procedure on 05/30/19 with  Dr. Bonna Gains she has switched insurance company and has obtained the new authorization number to give to you for her procedure

## 2019-05-27 ENCOUNTER — Other Ambulatory Visit
Admission: RE | Admit: 2019-05-27 | Discharge: 2019-05-27 | Disposition: A | Discharge status: Home / Self Care | Payer: PRIVATE HEALTH INSURANCE | Source: Ambulatory Visit | Attending: Gastroenterology | Admitting: Gastroenterology

## 2019-05-27 ENCOUNTER — Other Ambulatory Visit: Payer: Self-pay

## 2019-05-27 DIAGNOSIS — Z20828 Contact with and (suspected) exposure to other viral communicable diseases: Secondary | ICD-10-CM | POA: Insufficient documentation

## 2019-05-27 DIAGNOSIS — Z01812 Encounter for preprocedural laboratory examination: Secondary | ICD-10-CM | POA: Insufficient documentation

## 2019-05-27 LAB — SARS CORONAVIRUS 2 (TAT 6-24 HRS): SARS Coronavirus 2: NEGATIVE

## 2019-05-29 ENCOUNTER — Encounter: Payer: Self-pay | Admitting: *Deleted

## 2019-05-29 ENCOUNTER — Other Ambulatory Visit: Payer: Self-pay | Admitting: Pediatrics

## 2019-05-30 ENCOUNTER — Encounter
Admission: RE | Disposition: A | Discharge status: Home / Self Care | Payer: Self-pay | Source: Home / Self Care | Attending: Gastroenterology

## 2019-05-30 ENCOUNTER — Ambulatory Visit
Admission: RE | Admit: 2019-05-30 | Discharge: 2019-05-30 | Disposition: A | Discharge status: Home / Self Care | Payer: PRIVATE HEALTH INSURANCE | Attending: Gastroenterology | Admitting: Gastroenterology

## 2019-05-30 ENCOUNTER — Ambulatory Visit: Payer: PRIVATE HEALTH INSURANCE | Admitting: Certified Registered"

## 2019-05-30 ENCOUNTER — Other Ambulatory Visit: Payer: Self-pay

## 2019-05-30 DIAGNOSIS — F419 Anxiety disorder, unspecified: Secondary | ICD-10-CM | POA: Insufficient documentation

## 2019-05-30 DIAGNOSIS — D125 Benign neoplasm of sigmoid colon: Secondary | ICD-10-CM | POA: Diagnosis not present

## 2019-05-30 DIAGNOSIS — D175 Benign lipomatous neoplasm of intra-abdominal organs: Secondary | ICD-10-CM | POA: Diagnosis not present

## 2019-05-30 DIAGNOSIS — K6289 Other specified diseases of anus and rectum: Secondary | ICD-10-CM | POA: Insufficient documentation

## 2019-05-30 DIAGNOSIS — Z79890 Hormone replacement therapy (postmenopausal): Secondary | ICD-10-CM | POA: Insufficient documentation

## 2019-05-30 DIAGNOSIS — K635 Polyp of colon: Secondary | ICD-10-CM

## 2019-05-30 DIAGNOSIS — Z1211 Encounter for screening for malignant neoplasm of colon: Secondary | ICD-10-CM | POA: Diagnosis not present

## 2019-05-30 DIAGNOSIS — Z87891 Personal history of nicotine dependence: Secondary | ICD-10-CM | POA: Diagnosis not present

## 2019-05-30 DIAGNOSIS — J45909 Unspecified asthma, uncomplicated: Secondary | ICD-10-CM | POA: Insufficient documentation

## 2019-05-30 DIAGNOSIS — Z79899 Other long term (current) drug therapy: Secondary | ICD-10-CM | POA: Insufficient documentation

## 2019-05-30 DIAGNOSIS — Z86010 Personal history of colonic polyps: Secondary | ICD-10-CM | POA: Diagnosis present

## 2019-05-30 HISTORY — PX: COLONOSCOPY WITH PROPOFOL: SHX5780

## 2019-05-30 SURGERY — COLONOSCOPY WITH PROPOFOL
Anesthesia: General

## 2019-05-30 MED ORDER — PROPOFOL 10 MG/ML IV BOLUS
INTRAVENOUS | Status: DC | PRN
  Administered 2019-05-30: 30 mg via INTRAVENOUS
  Administered 2019-05-30: 20 mg via INTRAVENOUS
  Administered 2019-05-30: 50 mg via INTRAVENOUS
  Administered 2019-05-30: 20 mg via INTRAVENOUS
  Administered 2019-05-30: 30 mg via INTRAVENOUS

## 2019-05-30 MED ORDER — MIDAZOLAM HCL 2 MG/2ML IJ SOLN
INTRAMUSCULAR | Status: AC
  Filled 2019-05-30: qty 2

## 2019-05-30 MED ORDER — GLYCOPYRROLATE 0.2 MG/ML IJ SOLN
INTRAMUSCULAR | Status: DC | PRN
  Administered 2019-05-30: 0.2 mg via INTRAVENOUS

## 2019-05-30 MED ORDER — MIDAZOLAM HCL 2 MG/2ML IJ SOLN
INTRAMUSCULAR | Status: DC | PRN
  Administered 2019-05-30: 2 mg via INTRAVENOUS

## 2019-05-30 MED ORDER — LIDOCAINE HCL (CARDIAC) PF 100 MG/5ML IV SOSY
PREFILLED_SYRINGE | INTRAVENOUS | Status: DC | PRN
  Administered 2019-05-30: 100 mg via INTRATRACHEAL

## 2019-05-30 MED ORDER — PROPOFOL 500 MG/50ML IV EMUL
INTRAVENOUS | Status: DC | PRN
  Administered 2019-05-30: 140 ug/kg/min via INTRAVENOUS

## 2019-05-30 MED ORDER — SODIUM CHLORIDE 0.9 % IV SOLN
INTRAVENOUS | Status: DC
  Administered 2019-05-30 (×3): via INTRAVENOUS

## 2019-05-30 NOTE — H&P (Signed)
Hannah Antigua, MD [address], [place], [place], [place], [address] [address], [place], [place], [place], [address] Phone: [phone]  Fax: [phone]  Primary Care Physician:  Barbaraann Boys, MD   Pre-Procedure History & Physical: HPI:  HARLYM [name] is a 58 y.o. female is here for a colonoscopy.   Past Medical History:  Diagnosis Date  . Anxiety   . Asthma    allergy induced asthma  . Depression   . Environmental allergies   . Hernia of abdominal wall   . Herpes labialis   . History of hiatal hernia   . History of laparoscopic cholecystectomy    2017  . History of myomectomy   . Lumbar herniated disc   . Panic attacks   . Urinary incontinence   . Urticaria     Past Surgical History:  Procedure Laterality Date  . ABDOMINAL SURGERY  2001   MYOMECTOMY   . BLADDER SUSPENSION  2003   [facility] transobturator pubovaginal sling Dr Gaynelle Arabian  . BREAST BIOPSY Right 12/21/2010   [place]  . CHOLECYSTECTOMY N/A 11/10/2015   Procedure: LAPAROSCOPIC CHOLECYSTECTOMY;  Surgeon: Florene Glen, MD;  Location: [facility] ORS;  Service: General;  Laterality: N/A;  . COLONOSCOPY  2015   Dr Regino Bellow  . DILATION AND CURETTAGE OF UTERUS    . ESOPHAGOGASTRODUODENOSCOPY (EGD) WITH PROPOFOL N/A 01/23/2019   Procedure: ESOPHAGOGASTRODUODENOSCOPY (EGD) WITH PROPOFOL;  Surgeon: Virgel Manifold, MD;  Location: [facility] END[facility]COPY;  Service: Endoscopy;  Laterality: N/A;  . HYSTEROSCOPY     D&C, HYST X 3  . VENTRAL HERNIA REPAIR N/A 12/26/2016   Procedure: HERNIA REPAIR VENTRAL ADULT;  Surgeon: Florene Glen, MD;  Location: [facility] ORS;  Service: General;  Laterality: N/A;    Prior to Admission medications   Medication Sig Start Date End Date Taking? Authorizing Provider  acyclovir (ZOVIRAX) 200 MG/5ML suspension Take 5 mLs (200 mg total) by mouth 5 (five) times daily. 11/27/18  Yes Fontaine, Belinda Block, MD  albuterol (VENTOLIN HFA) 108 (90 Base) MCG/ACT inhaler INHALE 2 PUFFS  BY MOUTH EVERY 4 HOURS AS NEEDED FOR WHEEZING OR COUGHING SPELLS 02/13/19  Yes Bardelas, Jose A, MD  ALPRAZolam (XANAX) 1 MG tablet Take 0.5-1 mg by mouth 3 (three) times daily as needed for anxiety.    Yes [provider]  cetirizine (ZYRTEC) 10 MG tablet Take 10 mg by mouth at bedtime.    Yes [provider]  Cholecalciferol (VITAMIN D3) 3000 units TABS Take 3,000 Units by mouth daily.   Yes [provider]  fexofenadine (ALLEGRA) 180 MG tablet Take 180 mg by mouth daily.   Yes [provider]  medroxyPROGESTERone (PROVERA) 2.5 MG tablet Take 1 tablet (2.5 mg total) by mouth daily. 03/25/19  Yes Fontaine, Belinda Block, MD  omeprazole (PRILOSEC) 40 MG capsule Take 40 mg by mouth 2 (two) times a day.   Yes [provider]  EPINEPHrine (EPIPEN 2-PAK) 0.3 mg/0.3 mL IJ SOAJ injection Use as directed for severe allergic reaction. 02/11/19   Charlies Silvers, MD  estradiol (VIVELLE-DOT) 0.1 MG/24HR patch APPLY 1 PATCH(0.1 MG) EXTERNALLY TO THE SKIN 2 TIMES A WEEK 01/31/19   Fontaine, Belinda Block, MD  ipratropium-albuterol (DUONEB) 0.5-2.5 (3) MG/3ML SOLN Take 3 mLs by nebulization every 6 (six) hours as needed. 10/29/18   Coral Spikes, DO  megestrol (MEGACE) 20 MG tablet Take 1 tablet (20 mg total) by mouth daily. Patient not taking: Reported on 05/30/2019 03/05/19  Fontaine, Belinda Block, MD  mometasone (NASONEX) 50 MCG/ACT nasal spray mometasone 50 mcg/actuation nasal spray  U 2 SPRAYS IEN ONCE A DAY FOR NASAL CONGESTION OR DRAINAGE    [provider]    Allergies as of 02/17/2019 - Review Complete 01/24/2019  Allergen Reaction Noted  . Latex Hives, Itching, and Rash 06/02/2015  . Clonazepam Other (See Comments) 06/09/2017  . Escitalopram Swelling 01/01/2018  . Other  09/19/2018  . Codeine Nausea And Vomiting 12/23/2010  . Montelukast Other (See Comments) 05/17/2015    Family History  Problem Relation Age of Onset  . Cancer Maternal Uncle        2  MATERNAL UNCLES W COLON CA  . Heart disease Maternal Grandmother   . Hypertension Maternal Grandmother   . Cancer Maternal Grandfather        Lung  . Cancer Paternal Grandfather        Lung  . Myasthenia gravis Father   . Alzheimer's disease Father   . Healthy Mother   . Allergic rhinitis Neg Hx   . Angioedema Neg Hx   . Asthma Neg Hx   . Immunodeficiency Neg Hx   . Eczema Neg Hx   . Urticaria Neg Hx     Social History   Socioeconomic History  . Marital status: Married    Spouse name: Not on file  . Number of children: Not on file  . Years of education: Not on file  . Highest education level: Not on file  Occupational History  . Not on file  Social Needs  . Financial resource strain: Not on file  . Food insecurity    Worry: Not on file    Inability: Not on file  . Transportation needs    Medical: Not on file    Non-medical: Not on file  Tobacco Use  . Smoking status: Former Smoker    Packs/day: 1.00    Types: Cigarettes    Quit date: 08/13/2012    Years since quitting: 6.7  . Smokeless tobacco: Never Used  Substance and Sexual Activity  . Alcohol use: Yes    Comment: Wine daily  . Drug use: No  . Sexual activity: Yes    Partners: Male    Comment: VASECTOMY-1st intercourse 58 yo-Fewer than 5 partners  Lifestyle  . Physical activity    Days per week: Not on file    Minutes per session: Not on file  . Stress: Not on file  Relationships  . Social Herbalist on phone: Not on file    Gets together: Not on file    Attends religious service: Not on file    Active member of club or organization: Not on file    Attends meetings of clubs or organizations: Not on file    Relationship status: Not on file  . Intimate partner violence    Fear of current or ex partner: Not on file    Emotionally abused: Not on file    Physically abused: Not on file    Forced sexual activity: Not on file  Other Topics Concern  . Not on file  Social History Narrative  .  Not on file    Review of Systems: See HPI, otherwise negative ROS  Physical Exam: BP (!) 133/95   Pulse (S) (!) 115 Comment: pt took albuterol inhaler this am.  Temp 98.4 F (36.9 C) (Oral)   Resp 18   Ht 5\' 4"  (1.626 m)   Wt 74.4  kg   LMP 04/12/2012   SpO2 100%   BMI 28.15 kg/m  General:   Alert,  pleasant and cooperative in NAD Head:  Normocephalic and atraumatic. Neck:  Supple; no masses or thyromegaly. Lungs:  Clear throughout to auscultation, normal respiratory effort.    Heart:  +S1, +S2, Regular rate and rhythm, No edema. Abdomen:  Soft, nontender and nondistended. Normal bowel sounds, without guarding, and without rebound.   Neurologic:  Alert and  oriented x4;  grossly normal neurologically.  Impression/Plan: Mattye L Dvorkin is here for a colonoscopy to be performed for polyp surveillance  Risks, benefits, limitations, and alternatives regarding  colonoscopy have been reviewed with the patient.  Questions have been answered.  All parties agreeable.   Virgel Manifold, MD  05/30/2019, 9:07 AM

## 2019-05-30 NOTE — Anesthesia Postprocedure Evaluation (Signed)
Anesthesia Post Note  Patient: Peniel L Gorter  Procedure(s) Performed: COLONOSCOPY WITH PROPOFOL (N/A )  Patient location during evaluation: Endoscopy Anesthesia Type: General Level of consciousness: awake and alert Pain management: pain level controlled Vital Signs Assessment: post-procedure vital signs reviewed and stable Respiratory status: spontaneous breathing, nonlabored ventilation, respiratory function stable and patient connected to nasal cannula oxygen Cardiovascular status: blood pressure returned to baseline and stable Postop Assessment: no apparent nausea or vomiting Anesthetic complications: no     Last Vitals:  Vitals:   05/30/19 1012 05/30/19 1018  BP: 115/66 106/74  Pulse: 94   Resp: (!) 21   Temp:    SpO2: 100%     Last Pain:  Vitals:   05/30/19 1018  TempSrc:   PainSc: 0-No pain                 Martha Clan

## 2019-05-30 NOTE — Anesthesia Post-op Follow-up Note (Signed)
Anesthesia QCDR form completed.        

## 2019-05-30 NOTE — Transfer of Care (Signed)
Immediate Anesthesia Transfer of Care Note  Patient: Hannah [name]  Procedure(s) Performed: COLONOSCOPY WITH PROPOFOL (N/A )  Patient Location: Endoscopy Unit  Anesthesia Type:General  Level of Consciousness: awake, alert , oriented and patient cooperative  Airway & Oxygen Therapy: Patient Spontanous Breathing  Post-op Assessment: Report given to RN and Post -op Vital signs reviewed and stable  Post vital signs: Reviewed and stable  Last Vitals:  Vitals Value Taken Time  BP 104/85 05/30/19 0950  Temp 36.6 C 05/30/19 0950  Pulse 116 05/30/19 0951  Resp 29 05/30/19 0951  SpO2 100 % 05/30/19 0951  Vitals shown include unvalidated device data.  Last Pain:  Vitals:   05/30/19 0950  TempSrc: Tympanic  PainSc: 0-No pain         Complications: No apparent anesthesia complications

## 2019-05-30 NOTE — Op Note (Signed)
[facility]astroenterology Patient Name: Hannah [name] Procedure Date: 05/30/2019 9:17 AM MRN: [id number] Account #: [id number] Date of Birth: 1961/04/25 Admit Type: Outpatient Age: 58 Room: [facility] ENDO ROOM 3 Gender: Female Note Status: Finalized Procedure:            Colonoscopy Indications:          High risk colon cancer surveillance: Personal history                        of colonic polyps Providers:            Damere Brandenburg B. Bonna Gains MD, MD Referring MD:         Ane Payment, MD (Referring MD) Medicines:            Monitored Anesthesia Care Complications:        No immediate complications. Procedure:            Pre-Anesthesia Assessment:                       - ASA Grade Assessment: II - A patient with mild                        systemic disease.                       - Prior to the procedure, a History and Physical was                        performed, and patient medications, allergies and                        sensitivities were reviewed. The patient's tolerance of                        previous anesthesia was reviewed.                       - The risks and benefits of the procedure and the                        sedation options and risks were discussed with the                        patient. All questions were answered and informed                        consent was obtained.                       - Patient identification and proposed procedure were                        verified prior to the procedure by the physician, the                        nurse, the anesthesiologist, the anesthetist and the                        technician. The procedure was verified in the procedure  room.                       After obtaining informed consent, the colonoscope was                        passed under direct vision. Throughout the procedure,                        the patient's blood pressure, pulse, and oxygen        saturations were monitored continuously. The                        Colonoscope was introduced through the anus and                        advanced to the the cecum, identified by appendiceal                        orifice and ileocecal valve. The colonoscopy was                        performed with ease. The patient tolerated the                        procedure well. The quality of the bowel preparation                        was good. Findings:      The perianal and digital rectal examinations were normal.      There was a small lipoma, at the hepatic flexure. Biopsies were taken       with a cold forceps for histology.      Two sessile polyps were found in the sigmoid colon. The polyps were 4 to       6 mm in size. These polyps were removed with a cold snare. Resection and       retrieval were complete.      The exam was otherwise without abnormality.      The rectum, sigmoid colon, descending colon, transverse colon, ascending       colon and cecum appeared normal.      The retroflexed view of the distal rectum and anal verge was normal and       showed no anal or rectal abnormalities.      Anal papilla(e) were hypertrophied. Impression:           - Small lipoma at the hepatic flexure. Biopsied.                       - Two 4 to 6 mm polyps in the sigmoid colon, removed                        with a cold snare. Resected and retrieved.                       - The examination was otherwise normal.                       - The rectum, sigmoid colon, descending colon,  transverse colon, ascending colon and cecum are normal.                       - The distal rectum and anal verge are normal on                        retroflexion view.                       - Anal papilla(e) were hypertrophied. Recommendation:       - Discharge patient to home (with escort).                       - Advance diet as tolerated.                       - Continue present  medications.                       - Await pathology results.                       - Repeat colonoscopy in 5 years.                       - The findings and recommendations were discussed with                        the patient.                       - The findings and recommendations were discussed with                        the patient's family.                       - Return to primary care physician as previously                        scheduled. Procedure Code(s):    --- Professional ---                       [phone], Colonoscopy, flexible; with removal of tumor(s),                        polyp(s), or other lesion(s) by snare technique                       [id number], 71, Colonoscopy, flexible; with biopsy, single                        or multiple Diagnosis Code(s):    --- Professional ---                       Z86.010, Personal history of colonic polyps                       D17.5, Benign lipomatous neoplasm of intra-abdominal                        organs  K63.5, Polyp of colon                       K62.89, Other specified diseases of anus and rectum CPT copyright 2019 American Medical Association. All rights reserved. The codes documented in this report are preliminary and upon coder review may  be revised to meet current compliance requirements.  Vonda Antigua, MD Margretta Sidle B. Bonna Gains MD, MD 05/30/2019 9:56:27 AM This report has been signed electronically. Number of Addenda: 0 Note Initiated On: 05/30/2019 9:17 AM Scope Withdrawal Time: 0 hours 16 minutes 59 seconds  Total Procedure Duration: 0 hours 19 minutes 21 seconds  Estimated Blood Loss: Estimated blood loss: none.      [facility]

## 2019-05-30 NOTE — Anesthesia Preprocedure Evaluation (Signed)
Anesthesia Evaluation  Patient identified by MRN, date of birth, ID band Patient awake    Reviewed: Allergy & Precautions, H&P , NPO status , reviewed documented beta blocker date and time   History of Anesthesia Complications Negative for: history of anesthetic complications  Airway Mallampati: II  TM Distance: >3 FB Neck ROM: full    Dental  (+) Caps, Teeth Intact   Pulmonary neg shortness of breath, asthma , neg recent URI, former smoker,  Mild asthma, used inhaler prior to procedure   Pulmonary exam normal        Cardiovascular (-) hypertension(-) angina+ CAD  (-) Past MI Normal cardiovascular exam(-) dysrhythmias (-) Valvular Problems/Murmurs  Negative cardiac w/u years ago   Neuro/Psych neg Seizures PSYCHIATRIC DISORDERS Anxiety Depression  Neuromuscular disease    GI/Hepatic Neg liver ROS, hiatal hernia, GERD  ,  Endo/Other  negative endocrine ROS  Renal/GU negative Renal ROS     Musculoskeletal   Abdominal   Peds  Hematology   Anesthesia Other Findings Past Medical History: No date: Anxiety No date: Asthma     Comment:  allergy induced asthma No date: Depression No date: Environmental allergies No date: Hernia of abdominal wall No date: Herpes labialis No date: History of hiatal hernia No date: History of laparoscopic cholecystectomy     Comment:  2017 No date: History of myomectomy No date: Lumbar herniated disc No date: Panic attacks No date: Urinary incontinence No date: Urticaria  Past Surgical History: 2001: ABDOMINAL SURGERY     Comment:  MYOMECTOMY  2003: BLADDER SUSPENSION     Comment:  [place] transobturator pubovaginal sling Dr Gaynelle Arabian 12/21/2010: BREAST BIOPSY; Right     Comment:  [place] 11/10/2015: CHOLECYSTECTOMY; N/A     Comment:  Procedure: LAPAROSCOPIC CHOLECYSTECTOMY;  Surgeon:               Florene Glen, MD;  Location: [facility] ORS;  Service:               General;   Laterality: N/A; No date: DILATION AND CURETTAGE OF UTERUS No date: HYSTEROSCOPY     Comment:  D&C, HYST X 3 12/26/2016: VENTRAL HERNIA REPAIR; N/A     Comment:  Procedure: HERNIA REPAIR VENTRAL ADULT;  Surgeon:               Florene Glen, MD;  Location: [facility] ORS;  Service:               General;  Laterality: N/A;     Reproductive/Obstetrics                             Anesthesia Physical  Anesthesia Plan  ASA: II  Anesthesia Plan: General   Post-op Pain Management:    Induction: Intravenous  PONV Risk Score and Plan: 3 and Treatment may vary due to age or medical condition, TIVA and Propofol infusion  Airway Management Planned: Nasal Cannula and Natural Airway  Additional Equipment:   Intra-op Plan:   Post-operative Plan:   Informed Consent: I have reviewed the patients History and Physical, chart, labs and discussed the procedure including the risks, benefits and alternatives for the proposed anesthesia with the patient or authorized representative who has indicated his/her understanding and acceptance.     Dental Advisory Given  Plan Discussed with: CRNA  Anesthesia Plan Comments:         Anesthesia Quick Evaluation

## 2019-06-02 ENCOUNTER — Encounter: Payer: Self-pay | Admitting: Gastroenterology

## 2019-06-02 LAB — SURGICAL PATHOLOGY

## 2019-06-05 DIAGNOSIS — F1320 Sedative, hypnotic or anxiolytic dependence, uncomplicated: Secondary | ICD-10-CM | POA: Insufficient documentation

## 2019-07-08 ENCOUNTER — Other Ambulatory Visit: Payer: Self-pay | Admitting: Gynecology

## 2019-07-09 ENCOUNTER — Other Ambulatory Visit: Payer: Self-pay | Admitting: Gynecology

## 2019-07-09 ENCOUNTER — Encounter: Payer: Self-pay | Admitting: Gynecology

## 2019-07-11 NOTE — Telephone Encounter (Signed)
Okay for Prometrium 100 mg nightly #30 with refills through next annual exam.

## 2019-07-14 MED ORDER — PROGESTERONE MICRONIZED 100 MG PO CAPS: 100.0000 mg | ORAL_CAPSULE | Freq: Every day | ORAL | 1 refills | Status: DC

## 2019-08-20 ENCOUNTER — Ambulatory Visit: Payer: Self-pay | Admitting: Surgery

## 2019-11-05 ENCOUNTER — Encounter: Payer: Self-pay | Admitting: *Deleted

## 2019-11-05 ENCOUNTER — Telehealth: Payer: Self-pay | Admitting: General Practice

## 2019-11-05 NOTE — Telephone Encounter (Signed)
Received a request to schedule patient to establish with our office. I left a voicemail asking her to call the office regarding this request.

## 2019-11-20 ENCOUNTER — Encounter: Payer: Self-pay | Admitting: Emergency Medicine

## 2019-11-20 ENCOUNTER — Ambulatory Visit
Admission: EM | Admit: 2019-11-20 | Discharge: 2019-11-20 | Disposition: A | Discharge status: Home / Self Care | Payer: PRIVATE HEALTH INSURANCE | Attending: Family Medicine | Admitting: Family Medicine

## 2019-11-20 ENCOUNTER — Other Ambulatory Visit: Payer: Self-pay

## 2019-11-20 DIAGNOSIS — J029 Acute pharyngitis, unspecified: Secondary | ICD-10-CM | POA: Diagnosis present

## 2019-11-20 HISTORY — DX: Dysphagia, unspecified: R13.10

## 2019-11-20 LAB — GROUP A STREP BY PCR: Group A Strep by PCR: NOT DETECTED

## 2019-11-20 NOTE — Discharge Instructions (Addendum)
Tylenol/Ibuprofen as needed.  Warm salt water gargles.  OTC Nasal steroid.  We will call if the strep is positive.  Okay to get vaccine.  Take care  Dr. Lacinda Axon

## 2019-11-20 NOTE — ED Triage Notes (Signed)
Patient in today c/o sore throat and fatigue x 4 days. Patient states she has real bad allergies. Patient states she had hives on her face 2 weeks ago. Patient is scheduled to get her 2nd covid vaccine on 11/23/19.

## 2019-11-20 NOTE — ED Provider Notes (Signed)
[facility]    CSN: [id number] Arrival date & time: 11/20/19  0809      History   Chief Complaint Chief Complaint  Patient presents with  . Sore Throat  . Fatigue   HPI  59 year old [name] presents with the above complaints.  Patient reports ongoing allergies.  She suffers from chronic allergies and chronic urticaria.  She states that for the past 4 days she has had ongoing sore throat.  She initially attributed to her allergies and postnasal drip.  However, she states that it seems to be worsening.  She also reports ongoing fatigue but is unsure of this is related to her allergies or not.  No documented fever.  No reported sick contacts.  No relieving factors.  She is followed closely by allergy.  She rates her pain as 4/10 in severity.  No other associated symptoms.  No other complaints.  Past Medical History:  Diagnosis Date  . Anxiety   . Asthma    allergy induced asthma  . Depression   . Dysphagia   . Environmental allergies   . Hernia of abdominal wall   . Herpes labialis   . History of hiatal hernia   . History of laparoscopic cholecystectomy    2017  . History of myomectomy   . Lumbar herniated disc   . Panic attacks   . Urinary incontinence   . Urticaria     Patient Active Problem List   Diagnosis Date Noted  . Esophageal dysphagia   . Hiatal hernia   . Anxiety 11/08/2018  . Seasonal and perennial allergic rhinitis 02/26/2018  . Displacement of lumbar intervertebral disc without myelopathy 12/28/2016  . Low back pain 12/28/2016  . Ventral incisional hernia without gangrene   . Viral pharyngitis 05/05/2016  . Sore throat 05/04/2016  . Strep throat 04/19/2016  . Chronic urticaria 01/17/2016  . Acute cholecystitis 11/10/2015  . Cholecystitis   . History of colon polyps 10/15/2015  . Chronic anxiety 10/05/2015  . Diaphragmatic hernia 10/05/2015  . Insomnia 10/05/2015  . 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 Prinzmetal  angina ([place]) 10/05/2015  . Recurrent major depressive disorder ([place]) 10/05/2015  . Chronic idiopathic urticaria 07/12/2015  . Mild intermittent asthma without complication [id number]  . Detrusor muscle hypertonia 06/02/2015  . Vitamin D deficiency 06/02/2015  . Mild persistent asthma 05/17/2015  . Other allergic rhinitis 05/17/2015  . Idiopathic urticaria 05/17/2015  . GERD (gastroesophageal reflux disease) 05/17/2015  . Gastroesophageal reflux disease without esophagitis 05/17/2015    Past Surgical History:  Procedure Laterality Date  . ABDOMINAL SURGERY  2001   MYOMECTOMY   . BLADDER SUSPENSION  2003   [place] transobturator pubovaginal sling Dr Gaynelle Arabian  . BREAST BIOPSY Right 12/21/2010   [place]  . CHOLECYSTECTOMY N/A 11/10/2015   Procedure: LAPAROSCOPIC CHOLECYSTECTOMY;  Surgeon: Florene Glen, MD;  Location: [facility];  Service: General;  Laterality: N/A;  . COLONOSCOPY  2015   Dr Regino Bellow  . COLONOSCOPY WITH PROPOFOL N/A 05/30/2019   Procedure: COLONOSCOPY WITH PROPOFOL;  Surgeon: Virgel Manifold, MD;  Location: [facility];  Service: Endoscopy;  Laterality: N/A;  . DILATION AND CURETTAGE OF UTERUS    . ESOPHAGOGASTRODUODENOSCOPY (EGD) WITH PROPOFOL N/A 01/23/2019   Procedure: ESOPHAGOGASTRODUODENOSCOPY (EGD) WITH PROPOFOL;  Surgeon: Virgel Manifold, MD;  Location: [facility];  Service: Endoscopy;  Laterality: N/A;  . HYSTEROSCOPY     D&C, HYST X 3  . VENTRAL HERNIA REPAIR N/A 5/Hannah/2018  Procedure: HERNIA REPAIR VENTRAL ADULT;  Surgeon: Florene Glen, MD;  Location: [facility];  Service: General;  Laterality: N/A;    OB History    Gravida  3   Para  1   Term  1   Preterm      AB  2   Living  1     SAB  2   TAB      Ectopic      Multiple      Live Births  1            Home Medications    Prior to Admission medications   Medication Sig Start Date End Date Taking? Authorizing Provider  acyclovir (ZOVIRAX) 200 MG/5ML  suspension Take 5 mLs (200 mg total) by mouth 5 (five) times daily. 4/Hannah/20  Yes Fontaine, Belinda Block, MD  albuterol (VENTOLIN HFA) 108 (90 Base) MCG/ACT inhaler INHALE 2 PUFFS BY MOUTH EVERY 4 HOURS AS NEEDED FOR WHEEZING OR COUGHING SPELLS 02/13/19  Yes Bardelas, Jose A, MD  ALPRAZolam (XANAX) 1 MG tablet Take 0.5-1 mg by mouth 3 (three) times daily as needed for anxiety.    Yes [provider]  cetirizine (ZYRTEC) 10 MG tablet Take 10 mg by mouth at bedtime.    Yes [provider]  Cholecalciferol (VITAMIN D3) 3000 units TABS Take 3,000 Units by mouth daily.   Yes [provider]  EPINEPHrine (EPIPEN 2-PAK) 0.3 mg/0.3 mL IJ SOAJ injection Use as directed for severe allergic reaction. 02/11/19  Yes Bardelas, Jose A, MD  estradiol (VIVELLE-DOT) 0.1 MG/24HR patch APPLY 1 PATCH(0.1 MG) EXTERNALLY TO THE SKIN 2 TIMES A WEEK 01/31/19  Yes Fontaine, Belinda Block, MD  famotidine (PEPCID) 10 MG tablet Take 10 mg by mouth 2 (two) times daily.   Yes [provider]  fexofenadine (ALLEGRA) 180 MG tablet Take 180 mg by mouth daily.   Yes [provider]  guaiFENesin (ROBITUSSIN) 100 MG/5ML SOLN Take 20 mLs by mouth 2 (two) times daily as needed for cough or to loosen phlegm.   Yes [provider]  ipratropium-albuterol (DUONEB) 0.5-2.5 (3) MG/3ML SOLN Take 3 mLs by nebulization every 6 (six) hours as needed. 10/29/18  Yes Zola Runion G, DO  omeprazole (PRILOSEC) 40 MG capsule Take 40 mg by mouth 2 (two) times a day.   Yes [provider]  progesterone (PROMETRIUM) 100 MG capsule Take 1 capsule (100 mg total) by mouth daily. 07/14/19  Yes Fontaine, Belinda Block, MD  medroxyPROGESTERone (PROVERA) 2.5 MG tablet Take 1 tablet (2.5 mg total) by mouth daily. 03/25/19 11/20/19 Yes Fontaine, Belinda Block, MD    Family History Family History  Problem Relation Age of Onset  . Cancer Maternal Uncle        2 MATERNAL UNCLES W COLON CA  . Heart disease Maternal Grandmother    . Hypertension Maternal Grandmother   . Cancer Maternal Grandfather        Lung  . Cancer Paternal Grandfather        Lung  . Myasthenia gravis Father   . Alzheimer's disease Father   . Healthy Mother   . Allergic rhinitis Neg Hx   . Angioedema Neg Hx   . Asthma Neg Hx   . Immunodeficiency Neg Hx   . Eczema Neg Hx   . Urticaria Neg Hx     Social History Social History   Tobacco Use  . Smoking status: Former Smoker    Packs/day: 1.00  Types: Cigarettes    Quit date: 08/13/2012    Years since quitting: 7.2  . Smokeless tobacco: Never Used  Substance Use Topics  . Alcohol use: Yes    Comment: Wine daily  . Drug use: No     Allergies   Latex, Clonazepam, Escitalopram, Other, Codeine, and Montelukast   Review of Systems Review of Systems  Constitutional: Negative for fever.  HENT: Positive for postnasal drip and sore throat.    Physical Exam Triage Vital Signs ED Triage Vitals  Enc Vitals Group     BP 11/20/19 0826 139/82     Pulse Rate 11/20/19 0826 98     Resp 11/20/19 0826 18     Temp 11/20/19 0826 98.2 F (36.8 C)     Temp Source 11/20/19 0826 Oral     SpO2 11/20/19 0826 99 %     Weight 11/20/19 0822 170 lb (77.1 kg)     Height 11/20/19 0822 5\' 4"  (1.626 m)     Head Circumference --      Peak Flow --      Pain Score 11/20/19 0821 4     Pain Loc --      Pain Edu? --      Excl. in [place]? --    Updated Vital Signs BP 139/82 (BP Location: Left Arm)   Pulse 98   Temp 98.2 F (36.8 C) (Oral)   Resp 18   Ht 5\' 4"  (1.626 m)   Wt 77.1 kg   LMP 04/12/2012   SpO2 99%   BMI 29.18 kg/m   Visual Acuity Right Eye Distance:   Left Eye Distance:   Bilateral Distance:    Right Eye Near:   Left Eye Near:    Bilateral Near:     Physical Exam Vitals and nursing note reviewed.  Constitutional:      General: She is not in acute distress.    Appearance: Normal appearance. She is not ill-appearing.  HENT:     Head: Normocephalic and atraumatic.      Mouth/Throat:     Comments: Oropharynx with mild erythema.  Right tonsil stone noted.  No exudate. Eyes:     General:        Right eye: No discharge.        Left eye: No discharge.     Conjunctiva/sclera: Conjunctivae normal.  Cardiovascular:     Rate and Rhythm: Normal rate and regular rhythm.     Heart sounds: No murmur.  Pulmonary:     Effort: Pulmonary effort is normal.     Breath sounds: Normal breath sounds. No wheezing, rhonchi or rales.  Neurological:     Mental Status: She is alert.  Psychiatric:        Mood and Affect: Mood normal.        Behavior: Behavior normal.    UC Treatments / Results  Labs (all labs ordered are listed, but only abnormal results are displayed) Labs Reviewed  GROUP A STREP BY PCR    EKG   Radiology No results found.  Procedures Procedures (including critical care time)  Medications Ordered in UC Medications - No data to display  Initial Impression / Assessment and Plan / UC Course  I have reviewed the triage vital signs and the nursing notes.  Pertinent labs & imaging results that were available during my care of the patient were reviewed by me and considered in my medical decision making (see chart for details).    59 year old [name]  presents with pharyngitis.  Awaiting strep PCR.  I suspect that this is related to allergies or its viral in nature.  Tylenol and ibuprofen as needed.  Warm salt water gargles.  Over-the-counter nasal steroid.  Supportive care.  Final Clinical Impressions(s) / UC Diagnoses   Final diagnoses:  Pharyngitis, unspecified etiology     Discharge Instructions     Tylenol/Ibuprofen as needed.  Warm salt water gargles.  OTC Nasal steroid.  We will call if the strep is positive.  Okay to get vaccine.  Take care  Dr. Lacinda Axon   ED Prescriptions    None     PDMP not reviewed this encounter.   Thersa Salt [place], [place] 11/20/19 [phone]

## 2019-11-25 ENCOUNTER — Ambulatory Visit (INDEPENDENT_AMBULATORY_CARE_PROVIDER_SITE_OTHER): Payer: PRIVATE HEALTH INSURANCE | Admitting: Pediatrics

## 2019-11-25 ENCOUNTER — Other Ambulatory Visit: Payer: Self-pay

## 2019-11-25 ENCOUNTER — Encounter: Payer: Self-pay | Admitting: Pediatrics

## 2019-11-25 VITALS — BP 118/68 | HR 92 | Temp 98.3°F | Resp 16 | Ht 64.5 in | Wt 181.0 lb

## 2019-11-25 DIAGNOSIS — L501 Idiopathic urticaria: Secondary | ICD-10-CM

## 2019-11-25 DIAGNOSIS — K219 Gastro-esophageal reflux disease without esophagitis: Secondary | ICD-10-CM | POA: Diagnosis not present

## 2019-11-25 DIAGNOSIS — J3089 Other allergic rhinitis: Secondary | ICD-10-CM

## 2019-11-25 DIAGNOSIS — J302 Other seasonal allergic rhinitis: Secondary | ICD-10-CM

## 2019-11-25 DIAGNOSIS — T63481D Toxic effect of venom of other arthropod, accidental (unintentional), subsequent encounter: Secondary | ICD-10-CM

## 2019-11-25 DIAGNOSIS — J4520 Mild intermittent asthma, uncomplicated: Secondary | ICD-10-CM | POA: Diagnosis not present

## 2019-11-25 MED ORDER — HYDROCORTISONE 2.5 % EX CREA: TOPICAL_CREAM | CUTANEOUS | 3 refills | Status: DC

## 2019-11-25 MED ORDER — AZELASTINE HCL 0.05 % OP SOLN: 1.0000 [drp] | Freq: Two times a day (BID) | OPHTHALMIC | 5 refills | Status: DC | PRN

## 2019-11-25 MED ORDER — ALBUTEROL SULFATE HFA 108 (90 BASE) MCG/ACT IN AERS: INHALATION_SPRAY | RESPIRATORY_TRACT | 2 refills | Status: DC

## 2019-11-25 NOTE — Progress Notes (Signed)
[address] [place] [address] Dept: [phone]  FOLLOW UP NOTE  Patient ID: Hannah [name], female    DOB: 1960/12/01  Age: 59 y.o. MRN: [id number] Date of Office Visit: 11/25/2019  Assessment  Chief Complaint: Allergies  HPI Shahla L [place] presents for follow-up of chronic urticaria, allergic rhinitis, asthma and an insect sting allergy.  Her chronic urticaria is has been controlled by fexofenadine 180 mg in the morning and cetirizine 10 mg at night and famotidine 20 mg once a day.  During the past 3 weeks she increased famotidine to 20 mg twice a day because she had more itching. Her asthma is well controlled and she very rarely needs Ventolin Her allergic conjunctivitis is controlled by azelastine 0.05% - 1 drop twice a day if needed Her allergic rhinitis is controlled by Nasonex 2 sprays per nostril twice a day if needed   Drug Allergies:  Allergies  Allergen Reactions  . Latex Hives, Itching and Rash    no breathing issues   . Clonazepam Other (See Comments)    Numb  . Escitalopram Swelling  . Other   . Codeine Nausea And Vomiting    SENSITIVE  . Montelukast Other (See Comments)    nightmares    Physical Exam: BP 118/68   Pulse 92   Temp 98.3 F (36.8 C) (Temporal)   Resp 16   Ht 5' 4.5" (1.638 m)   Wt 181 lb (82.1 kg)   LMP 04/12/2012   SpO2 96%   BMI 30.59 kg/m    Physical Exam Vitals reviewed.  Constitutional:      Appearance: Normal appearance. She is normal weight.  HENT:     Head:     Comments: Eyes normal.  Ears normal.  Nose normal.  Pharynx normal. Cardiovascular:     Rate and Rhythm: Normal rate and regular rhythm.     Comments: S1-S2 normal no murmurs Pulmonary:     Comments: Clear to percussion and auscultation Musculoskeletal:     Cervical back: Neck supple.  Lymphadenopathy:     Cervical: No cervical adenopathy.  Skin:    Comments: Clear  Neurological:     General: No focal deficit present.     Mental Status:  She is alert and oriented to person, place, and time. Mental status is at baseline.  Psychiatric:        Mood and Affect: Mood normal.        Behavior: Behavior normal.        Thought Content: Thought content normal.        Judgment: Judgment normal.     Diagnostics: FVC 3.16 L FEV1 2.63 L.  Predicted FVC 3.43 L predicted FEV1 2.68 L-the spirometry is in the normal range  Assessment and Plan: 1. Mild intermittent asthma without complication   2. Chronic idiopathic urticaria   3. Gastroesophageal reflux disease without esophagitis   4. Seasonal and perennial allergic rhinitis   5. Allergic reaction to insect sting, accidental or unintentional, subsequent encounter     Meds ordered this encounter  Medications  . albuterol (VENTOLIN HFA) 108 (90 Base) MCG/ACT inhaler    Sig: INHALE 2 PUFFS BY MOUTH EVERY 4 HOURS AS NEEDED FOR WHEEZING OR COUGHING SPELLS    Dispense:  8.5 g    Refill:  2    On hold, pt will call.  Marland Kitchen azelastine (OPTIVAR) 0.05 % ophthalmic solution    Sig: Place 1 drop into both eyes 2 (two) times daily as needed (  for itchy eyes).    Dispense:  6 mL    Refill:  5    On hold, pt will call.  . hydrocortisone 2.5 % cream    Sig: Use twice a day if needed to red itchy areas    Dispense:  30 g    Refill:  3    On hold, pt will call.    Patient Instructions  Fexofenadine 180 mg in the morning for itching and Zyrtec 10 mg at night for runny nose or itching Nasonex 2 sprays per nostril once a day if needed for stuffy nose Famotidine 20 mg-take 1 tablet twice a day if needed for hives  Ventolin 2 puffs every 4 hours if needed for wheezing or coughing spells.  You may use Ventolin 2 puffs 5 to 15 minutes before exercise  Azelastine 0.05% - 1 drop in each eye twice a day if needed for itchy eyes  Hydrocortisone 2.5% cream twice a day if needed to red itchy areas  Continue avoiding stinging insects.  If you have an allergic reaction take Benadryl 50 mg every 4 hours  and if you have life-threatening symptoms inject with EpiPen 0.3 mg  Continue on your other medications Call us if you are not doing well on this treatment plan   Return in about 1 year (around 11/24/2020).    Thank you for the opportunity to care for this patient.  Please do not hesitate to contact me with questions.  Penne Lash, M.D.  [facility] [address] [place], [place] [address] [phone]

## 2019-11-25 NOTE — Patient Instructions (Addendum)
Fexofenadine 180 mg in the morning for itching and Zyrtec 10 mg at night for runny nose or itching Nasonex 2 sprays per nostril once a day if needed for stuffy nose Famotidine 20 mg-take 1 tablet twice a day if needed for hives  Ventolin 2 puffs every 4 hours if needed for wheezing or coughing spells.  You may use Ventolin 2 puffs 5 to 15 minutes before exercise  Azelastine 0.05% - 1 drop in each eye twice a day if needed for itchy eyes  Hydrocortisone 2.5% cream twice a day if needed to red itchy areas  Continue avoiding stinging insects.  If you have an allergic reaction take Benadryl 50 mg every 4 hours and if you have life-threatening symptoms inject with EpiPen 0.3 mg  Continue on your other medications Call us if you are not doing well on this treatment plan

## 2019-12-11 ENCOUNTER — Other Ambulatory Visit: Payer: Self-pay

## 2019-12-11 MED ORDER — ACYCLOVIR 200 MG/5ML PO SUSP: ORAL | 0 refills | Status: DC

## 2020-01-20 ENCOUNTER — Other Ambulatory Visit: Payer: Self-pay | Admitting: *Deleted

## 2020-01-20 ENCOUNTER — Encounter: Payer: Self-pay | Admitting: Obstetrics and Gynecology

## 2020-01-21 MED ORDER — CLINDAMYCIN PHOSPHATE 2 % VA CREA: TOPICAL_CREAM | VAGINAL | 0 refills | Status: DC

## 2020-01-21 MED ORDER — FLUCONAZOLE 150 MG PO TABS: 150.0000 mg | ORAL_TABLET | Freq: Once | ORAL | 0 refills | Status: AC

## 2020-01-21 NOTE — Telephone Encounter (Signed)
That's fine you can refill both the Diflucan and Cleocin as mentioned in TF note. If not helping she needs to be seen. Thanks.

## 2020-01-21 NOTE — Telephone Encounter (Signed)
Dr. Oletta Cohn the Rx's had been denied refill with a note she should schedule appointment.  At visit last May 2020 Dr. Loetta Rough wrote "Assessment/Plan:  59 y.o. [id number] with history of vaginal burning.  No significant discharge or odor.  We discussed the differential to include both yeast and bacterial vaginosis.  Not classic for either 2 diagnoses.  Has continued on her HRT and doing well from that standpoint.  Presents for management reviewed and ultimately I recommended Diflucan 150 mg daily x2 days to cover for yeast and Cleocin vaginal cream nightly x7 nights to cover for BV.  She will follow-up with office visit if her symptoms would persist, worsen or change."  She has CE scheduled 02/03/20 with you.  Do you want to refill either or have her come for RG to check infection?

## 2020-01-29 ENCOUNTER — Other Ambulatory Visit: Payer: Self-pay

## 2020-01-29 MED ORDER — FLUCONAZOLE 150 MG PO TABS: 150.0000 mg | ORAL_TABLET | Freq: Once | ORAL | 0 refills | Status: AC

## 2020-01-29 NOTE — Telephone Encounter (Signed)
She probably needs to be seen if still having these symptoms after using what was already prescribed. Perhaps a vaginal yeast culture (if that is what is going on?)

## 2020-01-29 NOTE — Telephone Encounter (Signed)
Trying to make sure we are treating the right thing and doing my due diligence to do so.

## 2020-02-02 ENCOUNTER — Other Ambulatory Visit: Payer: Self-pay

## 2020-02-03 ENCOUNTER — Encounter: Payer: Self-pay | Admitting: Obstetrics and Gynecology

## 2020-02-03 ENCOUNTER — Ambulatory Visit (INDEPENDENT_AMBULATORY_CARE_PROVIDER_SITE_OTHER): Payer: PRIVATE HEALTH INSURANCE | Admitting: Obstetrics and Gynecology

## 2020-02-03 VITALS — BP 122/76 | Ht 64.0 in | Wt 177.0 lb

## 2020-02-03 DIAGNOSIS — Z780 Asymptomatic menopausal state: Secondary | ICD-10-CM

## 2020-02-03 DIAGNOSIS — N898 Other specified noninflammatory disorders of vagina: Secondary | ICD-10-CM | POA: Diagnosis not present

## 2020-02-03 DIAGNOSIS — Z01419 Encounter for gynecological examination (general) (routine) without abnormal findings: Secondary | ICD-10-CM | POA: Diagnosis not present

## 2020-02-03 DIAGNOSIS — Z13220 Encounter for screening for lipoid disorders: Secondary | ICD-10-CM | POA: Diagnosis not present

## 2020-02-03 DIAGNOSIS — Z1329 Encounter for screening for other suspected endocrine disorder: Secondary | ICD-10-CM | POA: Diagnosis not present

## 2020-02-03 MED ORDER — PROGESTERONE MICRONIZED 100 MG PO CAPS: 100.0000 mg | ORAL_CAPSULE | Freq: Every day | ORAL | 4 refills | Status: DC

## 2020-02-03 MED ORDER — ESTRADIOL 0.1 MG/24HR TD PTTW: MEDICATED_PATCH | TRANSDERMAL | 4 refills | Status: DC

## 2020-02-03 NOTE — Progress Notes (Signed)
MARIALUISA BASARA 28-Oct-1960 [id number]  SUBJECTIVE:  59 y.o. [id number] female for annual routine gynecologic exam. She has been having some vaginal itching and she was out of town and she was remotely prescribed Cleocin gel and fluconazole.  Symptoms improved for about a week and then returned.  She has been spending a lot of time at the pool since she recently had one installed, and more recently she has been at [employer].  Current Outpatient Medications  Medication Sig Dispense Refill  . albuterol (VENTOLIN HFA) 108 (90 Base) MCG/ACT inhaler INHALE 2 PUFFS BY MOUTH EVERY 4 HOURS AS NEEDED FOR WHEEZING OR COUGHING SPELLS 8.5 g 2  . ALPRAZolam (XANAX) 1 MG tablet Take 0.5-1 mg by mouth 3 (three) times daily as needed for anxiety.     Marland Kitchen azelastine (OPTIVAR) 0.05 % ophthalmic solution Place 1 drop into both eyes 2 (two) times daily as needed (for itchy eyes). 6 mL 5  . cetirizine (ZYRTEC) 10 MG tablet Take 10 mg by mouth at bedtime.     . Cholecalciferol (VITAMIN D3) 3000 units TABS Take 3,000 Units by mouth daily.    Marland Kitchen EPINEPHrine (EPIPEN 2-PAK) 0.3 mg/0.3 mL IJ SOAJ injection Use as directed for severe allergic reaction. 2 each 0  . estradiol (VIVELLE-DOT) 0.1 MG/24HR patch APPLY 1 PATCH(0.1 MG) EXTERNALLY TO THE SKIN 2 TIMES A WEEK 8 patch 11  . famotidine (PEPCID) 10 MG tablet Take 10 mg by mouth 2 (two) times daily.    . famotidine (PEPCID) 20 MG tablet Take 20 mg by mouth 2 (two) times daily.    . fexofenadine (ALLEGRA) 180 MG tablet Take 180 mg by mouth daily.    . hydrocortisone 2.5 % cream Use twice a day if needed to red itchy areas 30 g 3  . ipratropium-albuterol (DUONEB) 0.5-2.5 (3) MG/3ML SOLN Take 3 mLs by nebulization every 6 (six) hours as needed. 90 mL 3  . omeprazole (PRILOSEC) 40 MG capsule Take 40 mg by mouth daily.     . progesterone (PROMETRIUM) 100 MG capsule Take 1 capsule (100 mg total) by mouth daily. 90 capsule 1  . valACYclovir (VALTREX) 500 MG tablet Take 500 mg by  mouth 2 (two) times daily.     No current facility-administered medications for this visit.   Allergies: Latex, Clonazepam, Escitalopram, Other, Codeine, and Montelukast  Patient's last menstrual period was 04/12/2012.  Past medical history,surgical history, problem list, medications, allergies, family history and social history were all reviewed and documented as reviewed in the EPIC chart.  ROS:  Feeling well. No dyspnea or chest pain on exertion.  No abdominal pain, change in bowel habits, black or bloody stools.  No urinary tract symptoms. GYN ROS: no abnormal bleeding, pelvic pain or discharge, no breast pain or new or enlarging lumps on self exam. No neurological complaints.   OBJECTIVE:  BP 122/76   Ht 5\' 4"  (1.626 m)   Wt 177 lb (80.3 kg)   LMP 04/12/2012   BMI 30.38 kg/m  The patient appears well, alert, oriented x 3, in no distress. ENT normal.  Neck supple. No cervical or supraclavicular adenopathy or thyromegaly.  Lungs are clear, good air entry, no wheezes, rhonchi or rales. S1 and S2 normal, no murmurs, regular rate and rhythm.  Abdomen soft without tenderness, guarding, mass or organomegaly.  Neurological is normal, no focal findings.  BREAST EXAM: breasts appear normal, no suspicious masses, no skin or nipple changes or axillary nodes  PELVIC EXAM: VULVA:  normal appearing vulva with no masses, tenderness or lesions, VAGINA: normal appearing vagina with normal color and minimal discharge, no lesions, CERVIX: normal appearing cervix without discharge or lesions, UTERUS: uterus is normal size, shape, consistency and nontender, ADNEXA: normal adnexa in size, nontender and no masses  Vaginitis panel PCR sure swab is obtained  Chaperone: [name] present during the examination  ASSESSMENT:  59 y.o. [id number] here for annual gynecologic exam  PLAN:   1. Postmenopausal.  Hot flashes and night sweats well managed with HRT.  She would like to continue for the time being  understanding the risks to include thrombotic disease such as heart attack, stroke, DVT, PE, breast cancer and endometrial cancer risks.  Refill x1 year provided for the estradiol patch and the Prometrium 100 mg nightly. 2.  Vaginal itching.  She has been empirically treated for bacterial vaginosis and vaginal candidiasis but still has some symptoms.  Vaginitis panel PCR is collected today to see if there is anything we are missing.  May need to consider vaginal fungal culture if yeast is positive.  If yeast is present would consider terconazole treatment this time.  Highly encouraged her to not wear wet bathing suits and to avoid moisture in the vulvar/vaginal area. 3. Pap smear/HPV 12/2017.  No significant history of abnormal Pap smears.  Next Pap smear due 2024 following the current guidelines recommending the 5 year interval. 4. Mammogram 03/2019.  Normal breast exam today.  She is reminded to schedule an annual mammogram this year when due. 5. Colonoscopy 2020.  Follow up at the recommended interval.  6. DEXA recommended approaching age 50. 7. Health maintenance.  She will proceed to lab today for routine screening blood work (lipids, CBC, CMP, TSH, vitamin D level).  Return annually or sooner, prn.  Joseph Pierini MD 02/03/20

## 2020-02-04 LAB — CBC
HCT: 42.7 % (ref 35.0–45.0)
Hemoglobin: 14.2 g/dL (ref 11.7–15.5)
MCH: 30.4 pg (ref 27.0–33.0)
MCHC: 33.3 g/dL (ref 32.0–36.0)
MCV: 91.4 fL (ref 80.0–100.0)
MPV: 11.1 fL (ref 7.5–12.5)
Platelets: 371 10*3/uL (ref 140–400)
RBC: 4.67 10*6/uL (ref 3.80–5.10)
RDW: 12.4 % (ref 11.0–15.0)
WBC: 9.6 10*3/uL (ref 3.8–10.8)

## 2020-02-04 LAB — LIPID PANEL
Cholesterol: 263 mg/dL — ABNORMAL HIGH (ref <200)
HDL: 63 mg/dL (ref >=50)
Non-HDL Cholesterol (Calc): 200 mg/dL (calc) — ABNORMAL HIGH (ref <130)
Total CHOL/HDL Ratio: 4.2 (calc) (ref <5.0)
Triglycerides: 441 mg/dL — ABNORMAL HIGH (ref <150)

## 2020-02-04 LAB — COMPREHENSIVE METABOLIC PANEL
AG Ratio: 1.7 (calc) (ref 1.0–2.5)
ALT: 22 U/L (ref 6–29)
AST: 20 U/L (ref 10–35)
Albumin: 4.6 g/dL (ref 3.6–5.1)
Alkaline phosphatase (APISO): 89 U/L (ref 37–153)
BUN: 13 mg/dL (ref 7–25)
CO2: 21 mmol/L (ref 20–32)
Calcium: 9.6 mg/dL (ref 8.6–10.4)
Chloride: 99 mmol/L (ref 98–110)
Creat: 0.84 mg/dL (ref 0.50–1.05)
Globulin: 2.7 g/dL (calc) (ref 1.9–3.7)
Glucose, Bld: 92 mg/dL (ref 65–99)
Potassium: 4.2 mmol/L (ref 3.5–5.3)
Sodium: 134 mmol/L — ABNORMAL LOW (ref 135–146)
Total Bilirubin: 0.6 mg/dL (ref 0.2–1.2)
Total Protein: 7.3 g/dL (ref 6.1–8.1)

## 2020-02-04 LAB — VITAMIN D 25 HYDROXY (VIT D DEFICIENCY, FRACTURES): Vit D, 25-Hydroxy: 21 ng/mL — ABNORMAL LOW (ref 30–100)

## 2020-02-04 LAB — TSH: TSH: 1.71 mIU/L (ref 0.40–4.50)

## 2020-02-06 ENCOUNTER — Encounter: Payer: Self-pay | Admitting: Obstetrics and Gynecology

## 2020-02-06 DIAGNOSIS — E559 Vitamin D deficiency, unspecified: Secondary | ICD-10-CM

## 2020-02-06 NOTE — Telephone Encounter (Signed)
Dr.Kendall patient sent 2 different message attached to a old message.   I did tell patient "The culture has not returned yet,I checked with the lab and was told this specific culture takes a little longer to come back because it needs time for the bacteria to grow on it. I will check with Dr.Kendall to see how much more vitamin d you should take and get back with you.  Sometimes the doctors recommend increasing to extra 1,000 units to whatever your are taking."  She replied "Please ask him for any suggestions that he has for some relief until the culture returns"

## 2020-02-06 NOTE — Telephone Encounter (Signed)
3000-4000 IU vitamin D per day and recheck level in 3 months Unless I know what I am treating I have to guess (since the swab test result has not returned).  Perhaps she could try Monistat 7 day course. Would also give thought to a vaginal estrogen cream (which will not cure symptoms right away), although she is already on the estradiol patch so the double dose of estrogen would potentially increase some of the blood clot risks. Those are the best 2 options to consider at this time.

## 2020-02-07 LAB — SURESWAB BACTERIAL VAGINOSIS/ITIS
Atopobium vaginae: NOT DETECTED Log (cells/mL)
C. albicans, DNA: NOT DETECTED
C. glabrata, DNA: NOT DETECTED
C. parapsilosis, DNA: DETECTED — AB
C. tropicalis, DNA: NOT DETECTED
Gardnerella vaginalis: NOT DETECTED Log (cells/mL)
LACTOBACILLUS SPECIES: DETECTED Log (cells/mL)
MEGASPHAERA SPECIES: NOT DETECTED Log (cells/mL)
Trichomonas vaginalis RNA: NOT DETECTED

## 2020-02-10 ENCOUNTER — Other Ambulatory Visit: Payer: Self-pay

## 2020-02-10 MED ORDER — FLUCONAZOLE 150 MG PO TABS: ORAL_TABLET | ORAL | 0 refills | Status: DC

## 2020-02-10 NOTE — Progress Notes (Signed)
Fluconazole + boric acid is option 1 Option 2 terazol 7 Thanks for clarifying

## 2020-02-23 DIAGNOSIS — R009 Unspecified abnormalities of heart beat: Secondary | ICD-10-CM | POA: Insufficient documentation

## 2020-03-01 ENCOUNTER — Other Ambulatory Visit: Payer: Self-pay

## 2020-03-01 ENCOUNTER — Encounter: Payer: Self-pay | Admitting: Allergy & Immunology

## 2020-03-01 ENCOUNTER — Ambulatory Visit (INDEPENDENT_AMBULATORY_CARE_PROVIDER_SITE_OTHER): Payer: PRIVATE HEALTH INSURANCE | Admitting: Allergy & Immunology

## 2020-03-01 VITALS — BP 128/80 | HR 97 | Resp 16 | Ht 64.0 in | Wt 179.0 lb

## 2020-03-01 DIAGNOSIS — J302 Other seasonal allergic rhinitis: Secondary | ICD-10-CM

## 2020-03-01 DIAGNOSIS — L501 Idiopathic urticaria: Secondary | ICD-10-CM

## 2020-03-01 DIAGNOSIS — T63481D Toxic effect of venom of other arthropod, accidental (unintentional), subsequent encounter: Secondary | ICD-10-CM | POA: Diagnosis not present

## 2020-03-01 DIAGNOSIS — J4520 Mild intermittent asthma, uncomplicated: Secondary | ICD-10-CM

## 2020-03-01 DIAGNOSIS — K219 Gastro-esophageal reflux disease without esophagitis: Secondary | ICD-10-CM | POA: Diagnosis not present

## 2020-03-01 DIAGNOSIS — J3089 Other allergic rhinitis: Secondary | ICD-10-CM

## 2020-03-01 MED ORDER — EUCRISA 2 % EX OINT: 1.0000 "application " | TOPICAL_OINTMENT | Freq: Two times a day (BID) | CUTANEOUS | 5 refills | Status: DC

## 2020-03-01 NOTE — Patient Instructions (Addendum)
1. Chronic idiopathic urticaria - We are going to submit for Xolair. - Information on Xolair provided. - Tammy will reach out to you to discuss the approval process and the copay program. - In the interim, continue with Zyrtec and increase to one tablet twice daily. - Continue with famotidine 20mg  daily. - Add on Eucrisa twice daily as needed for rash/urticaria (safe to use on the face). Hannah [name] does burn, so place in the fridge to cool it down (samples provided).   2. Allergic reaction to insect sting - EpiPen is up to date.  4. Mild intermittent asthma without complication - Lung testing deterred. - Continue with albuterol as needed.   5. Seasonal and perennial allergic rhinitis - Continue with the allergy medications, as above.  6. Return in about 3 months (around 06/01/2020). This can be an in-person, a virtual Webex or a telephone follow up visit.   Please inform us of any Emergency Department visits, hospitalizations, or changes in symptoms. Call us before going to the ED for breathing or allergy symptoms since we might be able to fit you in for a sick visit. Feel free to contact us anytime with any questions, problems, or concerns.  It was a pleasure to meet you today!  Websites that have reliable patient information: 1. American Academy of Asthma, Allergy, and Immunology: www.aaaai.org 2. Food Allergy Research and Education (FARE): foodallergy.org 3. Mothers of Asthmatics: http://www.asthmacommunitynetwork.org 4. American College of Allergy, Asthma, and Immunology: www.acaai.org   COVID-19 Vaccine Information can be found at: ShippingScam.co.uk For questions related to vaccine distribution or appointments, please email [email or web address] or call [phone].     "Like" [place] on Facebook and Instagram for our latest updates!        Make sure you are registered to vote! If you have moved or changed any of  your contact information, you will need to get this updated before voting!  In some cases, you MAY be able to register to vote online: [email or web address]

## 2020-03-01 NOTE — Progress Notes (Signed)
FOLLOW UP  Date of Service/Encounter:  03/01/20   Assessment:   Chronic idiopathic urticaria - needing 4 or 5 courses of prednisone annually for control  Gastroesophageal reflux disease  Allergic reaction to insect sting - unclear whether this is confirmed with testing or not  Mild intermittent asthma without complication  Seasonal and perennial allergic rhinitis  Plan/Recommendations:   1. Chronic idiopathic urticaria - We are going to submit for Xolair. - Information on Xolair provided. - Tammy will reach out to you to discuss the approval process and the copay program. - In the interim, continue with Zyrtec and increase to one tablet twice daily. - Continue with famotidine 20mg  daily. - Add on Eucrisa twice daily as needed for rash/urticaria (safe to use on the face). Georga Hacking does burn, so place in the fridge to cool it down (samples provided).   2. Allergic reaction to insect sting - EpiPen is up to date.  4. Mild intermittent asthma without complication - Lung testing deterred. - Continue with albuterol as needed.   5. Seasonal and perennial allergic rhinitis - Continue with the allergy medications, as above.  6. Return in about 3 months (around 06/01/2020). This can be an in-person, a virtual Webex or a telephone follow up visit.   Subjective:   Hannah [name] is a 59 y.o. female presenting today for follow up of  Chief Complaint  Patient presents with  . Asthma  . Urticaria    Hannah [name] has a history of the following: Patient Active Problem List   Diagnosis Date Noted  . Esophageal dysphagia   . Hiatal hernia   . Anxiety 11/08/2018  . Seasonal and perennial allergic rhinitis 02/26/2018  . Displacement of lumbar intervertebral disc without myelopathy 12/28/2016  . Low [name] pain 12/28/2016  . Ventral incisional hernia without gangrene   . Viral pharyngitis 05/05/2016  . Sore throat 05/04/2016  . Strep throat 04/19/2016  . Chronic  urticaria 01/17/2016  . Acute cholecystitis 11/10/2015  . Cholecystitis   . History of colon polyps 10/15/2015  . Chronic anxiety 10/05/2015  . Diaphragmatic hernia 10/05/2015  . Insomnia 10/05/2015  . 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 Prinzmetal angina ([place]) 10/05/2015  . Recurrent major depressive disorder ([place]) 10/05/2015  . Chronic idiopathic urticaria 07/12/2015  . Mild intermittent asthma without complication 28/41/3244  . Detrusor muscle hypertonia 06/02/2015  . Vitamin D deficiency 06/02/2015  . Mild persistent asthma 05/17/2015  . Other allergic rhinitis 05/17/2015  . Idiopathic urticaria 05/17/2015  . GERD (gastroesophageal reflux disease) 05/17/2015  . Gastroesophageal reflux disease without esophagitis 05/17/2015    History obtained from: chart review and patient.  Hannah [name] is a 59 y.o. female presenting for a follow up visit.  She was last seen in April 2021 by Dr. Shaune Leeks.  At that time, she was continued on fexofenadine 180 mg in the morning and Zyrtec 10 mg at night as well as Nasonex 2 sprays per nostril daily and famotidine 20 mg twice daily.   Since the last visit, she has done fairly well.  However, she did have an urticarial outbreak in the last couple weeks that required a course of prednisone from her primary care provider.  In total, she estimates that she gets urticaria requiring prednisone around 4-5 times per year.  She calls this "good control".  Before Dr. Shaune Leeks saw her and treated her, she was having prednisone quite a bit more often.  Asthma/Respiratory Symptom History: Asthma is nearly  non existent. She notes that if it rains a lot, her symptoms will flare. She does not need prednisone "too often". She got some last Friday, but this was due to the hives. Marlaya's asthma has been well controlled. She has not required rescue medication, experienced nocturnal awakenings due to lower respiratory symptoms, nor have activities of daily  living been limited. She has required no Emergency Department or Urgent Care visits for her asthma. She has required zero courses of systemic steroids for asthma exacerbations since the last visit. ACT score today is 25, indicating excellent asthma symptom control.    Allergic Rhinitis Symptom History: She has never been on allergy shots.  Overall, her environmental allergies are none her main concern today.  She is more concerned of the urticaria.  She has not needed antibiotics for sinus infections in quite some time.  Urticaria Symptom History: She is on the liquid Allegra twice daily (Children's formula). She felt that the tablets were "too strong" or sometimes did not wokr at all.  She is also on the cetirizine 10mg  daily and famotidine.   She was allergies to a tone of foods when she was younger but she outgrew it all. In 2006 or 2007, she was bit by "something" which caused her "world to flip upside down" (I assume that she means anaphylaxis). There is no discussion of her stinging insect allergy in her past notes from what I can tell. She has never had blood testing done to confirm this.   Otherwise, there have been no changes to her past medical history, surgical history, family history, or social history.    Review of Systems  Constitutional: Negative.  Negative for chills, fever, malaise/fatigue and weight loss.  HENT: Negative.  Negative for congestion, ear discharge and ear pain.   Eyes: Negative for pain, discharge and redness.  Respiratory: Negative for cough, sputum production, shortness of breath and wheezing.   Cardiovascular: Negative.  Negative for chest pain and palpitations.  Gastrointestinal: Negative for abdominal pain, constipation, diarrhea, heartburn, nausea and vomiting.  Skin: Positive for itching and rash.  Neurological: Negative for dizziness and headaches.  Endo/Heme/Allergies: Negative for environmental allergies. Does not bruise/bleed easily.        Objective:   Blood pressure 128/80, pulse 97, resp. rate 16, height 5\' 4"  (1.626 m), weight 179 lb (81.2 kg), last menstrual period 04/12/2012, SpO2 99 %. Body mass index is 30.73 kg/m.   Physical Exam:  Physical Exam Constitutional:      Appearance: She is well-developed.     Comments: Pleasant female. Cooperative with the exam.   HENT:     Head: Normocephalic and atraumatic.     Right Ear: Tympanic membrane, ear canal and external ear normal.     Left Ear: Tympanic membrane, ear canal and external ear normal.     Nose: No nasal deformity, septal deviation, mucosal edema or rhinorrhea.     Right Turbinates: Enlarged. Not swollen or pale.     Left Turbinates: Enlarged. Not swollen or pale.     Right Sinus: No maxillary sinus tenderness or frontal sinus tenderness.     Left Sinus: No maxillary sinus tenderness or frontal sinus tenderness.     Mouth/Throat:     Mouth: Mucous membranes are not pale and not dry.     Pharynx: Uvula midline.     Comments: No cobblestoning present.  Eyes:     General:        Right eye: No discharge.  Left eye: No discharge.     Conjunctiva/sclera: Conjunctivae normal.     Right eye: Right conjunctiva is not injected. No chemosis.    Left eye: Left conjunctiva is not injected. No chemosis.    Pupils: Pupils are equal, round, and reactive to light.  Cardiovascular:     Rate and Rhythm: Normal rate and regular rhythm.     Heart sounds: Normal heart sounds.  Pulmonary:     Effort: Pulmonary effort is normal. No tachypnea, accessory muscle usage or respiratory distress.     Breath sounds: Normal breath sounds. No wheezing, rhonchi or rales.     Comments: Moving air well in all lung fields. No increased work of breathing noted.  Chest:     Chest wall: No tenderness.  Lymphadenopathy:     Cervical: No cervical adenopathy.  Skin:    Coloration: Skin is not pale.     Findings: No abrasion, erythema, petechiae or rash. Rash is not papular,  urticarial or vesicular.     Comments: No urticaria. No eczematous lesions noted. No excoriations.   Neurological:     Mental Status: She is alert.      Diagnostic studies: none     Salvatore Marvel, MD  All[facility] and [place] [facility] [place]

## 2020-03-02 ENCOUNTER — Encounter: Payer: Self-pay | Admitting: Allergy & Immunology

## 2020-03-04 ENCOUNTER — Telehealth: Payer: Self-pay | Admitting: *Deleted

## 2020-03-04 NOTE — Telephone Encounter (Signed)
-----   Message from Valentina Shaggy, MD sent at 03/02/2020 10:47 AM EDT ----- Possible new Xolair start for [facility]. She has some questions. Can you call to discuss?

## 2020-03-04 NOTE — Telephone Encounter (Signed)
Thanks for reaching out to the patient!   Salvatore Marvel, MD Allergy and [facility] [place]

## 2020-03-04 NOTE — Telephone Encounter (Signed)
Reached out to patient to discuss starting Xolair for her urticaria.  Patient advised she has info and wants more time to decide on whether to start therapy.  I advised her whens he decided she can reach back out to me.

## 2020-03-05 ENCOUNTER — Telehealth: Payer: Self-pay

## 2020-03-05 NOTE — Telephone Encounter (Signed)
contact [place] [facility]. at [phone] be initiate for [place]

## 2020-03-08 NOTE — Telephone Encounter (Signed)
Prior [employer] form for Eucrisa 2% was sent from [employer]. Faxed completed information to pharmacy at [phone] for pending approval.

## 2020-03-08 NOTE — Telephone Encounter (Signed)
Spoke with pharmacy and they are faxing over a form to be completed.

## 2020-03-08 NOTE — Telephone Encounter (Signed)
Contacted pharmacy was advise to contact number [phone]

## 2020-03-17 ENCOUNTER — Other Ambulatory Visit: Payer: Self-pay | Admitting: Obstetrics and Gynecology

## 2020-03-17 DIAGNOSIS — Z1231 Encounter for screening mammogram for malignant neoplasm of breast: Secondary | ICD-10-CM

## 2020-03-19 ENCOUNTER — Ambulatory Visit: Payer: Self-pay

## 2020-04-05 ENCOUNTER — Ambulatory Visit
Admission: RE | Admit: 2020-04-05 | Discharge: 2020-04-05 | Disposition: A | Discharge status: Home / Self Care | Payer: PRIVATE HEALTH INSURANCE | Source: Ambulatory Visit | Attending: Obstetrics and Gynecology | Admitting: Obstetrics and Gynecology

## 2020-04-05 ENCOUNTER — Other Ambulatory Visit: Payer: Self-pay

## 2020-04-05 DIAGNOSIS — Z1231 Encounter for screening mammogram for malignant neoplasm of breast: Secondary | ICD-10-CM

## 2020-04-07 ENCOUNTER — Other Ambulatory Visit: Payer: Self-pay | Admitting: Obstetrics and Gynecology

## 2020-06-03 ENCOUNTER — Ambulatory Visit (INDEPENDENT_AMBULATORY_CARE_PROVIDER_SITE_OTHER): Payer: PRIVATE HEALTH INSURANCE | Admitting: Allergy & Immunology

## 2020-06-03 ENCOUNTER — Encounter: Payer: Self-pay | Admitting: Allergy & Immunology

## 2020-06-03 ENCOUNTER — Other Ambulatory Visit: Payer: Self-pay

## 2020-06-03 VITALS — BP 118/70 | HR 87 | Temp 99.1°F | Resp 14 | Ht 63.25 in | Wt 179.8 lb

## 2020-06-03 DIAGNOSIS — T63481D Toxic effect of venom of other arthropod, accidental (unintentional), subsequent encounter: Secondary | ICD-10-CM | POA: Diagnosis not present

## 2020-06-03 DIAGNOSIS — K219 Gastro-esophageal reflux disease without esophagitis: Secondary | ICD-10-CM

## 2020-06-03 DIAGNOSIS — L501 Idiopathic urticaria: Secondary | ICD-10-CM

## 2020-06-03 DIAGNOSIS — J4520 Mild intermittent asthma, uncomplicated: Secondary | ICD-10-CM | POA: Diagnosis not present

## 2020-06-03 MED ORDER — FAMOTIDINE 20 MG PO TABS: 20.0000 mg | ORAL_TABLET | Freq: Two times a day (BID) | ORAL | 5 refills | Status: DC

## 2020-06-03 MED ORDER — CETIRIZINE HCL 10 MG PO TABS: 10.0000 mg | ORAL_TABLET | Freq: Every day | ORAL | 5 refills | Status: DC

## 2020-06-03 MED ORDER — IPRATROPIUM-ALBUTEROL 0.5-2.5 (3) MG/3ML IN SOLN: 3.0000 mL | Freq: Four times a day (QID) | RESPIRATORY_TRACT | 3 refills | Status: DC | PRN

## 2020-06-03 MED ORDER — FEXOFENADINE HCL 180 MG PO TABS: 180.0000 mg | ORAL_TABLET | Freq: Every day | ORAL | 5 refills | Status: DC

## 2020-06-03 MED ORDER — ALBUTEROL SULFATE HFA 108 (90 BASE) MCG/ACT IN AERS: INHALATION_SPRAY | RESPIRATORY_TRACT | 2 refills | Status: DC

## 2020-06-03 MED ORDER — AZELASTINE HCL 0.05 % OP SOLN: 1.0000 [drp] | Freq: Two times a day (BID) | OPHTHALMIC | 5 refills | Status: DC | PRN

## 2020-06-03 NOTE — Patient Instructions (Addendum)
1. Chronic idiopathic urticaria - Consider starting Xolair. - This will help prevent future outbreaks. - Continue with Zyrtec and increase to one tablet twice daily. - Continue with famotidine 20mg  daily.  2. Allergic reaction to insect sting - EpiPen is up to date.  4. Mild intermittent asthma without complication - Lung testing looks awesome. - Continue with albuterol as needed.   5. Seasonal and perennial allergic rhinitis - Continue with the allergy medications, as above.  6. Return in about 6 months (around 12/02/2020).    Please inform us of any Emergency Department visits, hospitalizations, or changes in symptoms. Call us before going to the ED for breathing or allergy symptoms since we might be able to fit you in for a sick visit. Feel free to contact us anytime with any questions, problems, or concerns.  It was a pleasure to see you again today!  Websites that have reliable patient information: 1. American Academy of Asthma, Allergy, and Immunology: www.aaaai.org 2. Food Allergy Research and Education (FARE): foodallergy.org 3. Mothers of Asthmatics: http://www.asthmacommunitynetwork.org 4. American College of Allergy, Asthma, and Immunology: www.acaai.org   COVID-19 Vaccine Information can be found at: ShippingScam.co.uk For questions related to vaccine distribution or appointments, please email [email or web address] or call [phone].     "Like" [place] on Facebook and Instagram for our latest updates!     HAPPY FALL!     Make sure you are registered to vote! If you have moved or changed any of your contact information, you will need to get this updated before voting!  In some cases, you MAY be able to register to vote online: [email or web address]

## 2020-06-03 NOTE — Progress Notes (Signed)
FOLLOW UP  Date of Service/Encounter:  06/03/20   Assessment:   Chronic idiopathic urticaria - needing 4 or 5 courses of prednisone annually for control  Gastroesophageal reflux disease  Allergic reaction to insect sting - unclear whether this is confirmed with testing or not  Mild intermittent asthma without complication  Seasonal and perennial allergic rhinitis  Plan/Recommendations:   1. Chronic idiopathic urticaria - Consider starting Xolair. - This will help prevent future outbreaks. - Continue with Zyrtec and increase to one tablet twice daily. - Continue with famotidine 20mg  daily.  2. Allergic reaction to insect sting - EpiPen is up to date.  4. Mild intermittent asthma without complication - Lung testing looks awesome. - Continue with albuterol as needed.   5. Seasonal and perennial allergic rhinitis - Continue with the allergy medications, as above.  6. Return in about 6 months (around 12/02/2020).    Subjective:   Hannah [name] is a 59 y.o. female presenting today for follow up of  Chief Complaint  Patient presents with  . Allergic Rhinitis     Pretty good.  . Asthma    Says she went walking at a complex and started having a slight reaction and used rescue inhaler    Hannah [name] has a history of the following: Patient Active Problem List   Diagnosis Date Noted  . Esophageal dysphagia   . Hiatal hernia   . Anxiety 11/08/2018  . Seasonal and perennial allergic rhinitis 02/26/2018  . Displacement of lumbar intervertebral disc without myelopathy 12/28/2016  . Low back pain 12/28/2016  . Ventral incisional hernia without gangrene   . Viral pharyngitis 05/05/2016  . Sore throat 05/04/2016  . Strep throat 04/19/2016  . Chronic urticaria 01/17/2016  . Acute cholecystitis 11/10/2015  . Cholecystitis   . History of colon polyps 10/15/2015  . Chronic anxiety 10/05/2015  . Diaphragmatic hernia 10/05/2015  . Insomnia 10/05/2015  .  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 Prinzmetal angina ([place]) 10/05/2015  . Recurrent major depressive disorder ([place]) 10/05/2015  . Chronic idiopathic urticaria 07/12/2015  . Mild intermittent asthma without complication 24/04/7352  . Detrusor muscle hypertonia 06/02/2015  . Vitamin D deficiency 06/02/2015  . Mild persistent asthma 05/17/2015  . Other allergic rhinitis 05/17/2015  . Idiopathic urticaria 05/17/2015  . GERD (gastroesophageal reflux disease) 05/17/2015  . Gastroesophageal reflux disease without esophagitis 05/17/2015    History obtained from: chart review and patient.  Hannah [name] is a 59 y.o. female presenting for a follow up visit. She was last seen in July 2021. At that time, we made the decision to start Xolair.  In the interim, we continue with Zyrtec twice daily as well as famotidine twice daily.  We did add on Eucrisa twice daily as needed.  We made sure her EpiPen was up-to-date.  Her lung testing was deferred.  We continue with albuterol as needed.  For her allergic rhinitis, we continued with the antihistamines.  Since last visit, she has really done well.   Asthma/Respiratory Symptom History: She did have an asthma episode yesterday. She knows that she is mostly allergy induced. She is in the middle of an adqusition and she was walking 11 acres. She was gasping a lot after this. She used her rescue inhaler with improvement in her symptoms. She even used the DUoNeb to treat this. She tries starting with her MDI inhaler to see if this helps. Then she will do on to the DuoNeb if needed.  Allergic Rhinitis Symptom History: She is using antihistamines daily. She has not been using any nasal sprays at all. He has not been using any nasal saline rinses.   Urticaria Symptom History: She does have intermittent outbreaks around her left eye. These occur around every couple of weeks. They occur over hte course of a few hours and are extremely pruritic.    Otherwise, there have been no changes to her past medical history, surgical history, family history, or social history.    Review of Systems  Constitutional: Negative.  Negative for chills, fever, malaise/fatigue and weight loss.  HENT: Negative.  Negative for congestion, ear discharge, ear pain and sore throat.   Eyes: Negative for pain, discharge and redness.  Respiratory: Negative for cough, sputum production, shortness of breath and wheezing.   Cardiovascular: Negative.  Negative for chest pain and palpitations.  Gastrointestinal: Negative for abdominal pain, constipation, diarrhea, heartburn, nausea and vomiting.  Skin: Positive for itching and rash.  Neurological: Negative for dizziness and headaches.  Endo/Heme/Allergies: Negative for environmental allergies. Does not bruise/bleed easily.       Objective:   Blood pressure 118/70, pulse 87, temperature 99.1 F (37.3 C), resp. rate 14, height 5' 3.25" (1.607 m), weight 179 lb 12.8 oz (81.6 kg), last menstrual period 04/12/2012, SpO2 97 %. Body mass index is 31.6 kg/m.   Physical Exam:  Physical Exam Constitutional:      Appearance: She is well-developed.     Comments: Pleasant talkative female. Very cooperative with the exam.   HENT:     Head: Normocephalic and atraumatic.     Right Ear: Tympanic membrane, ear canal and external ear normal.     Left Ear: Tympanic membrane, ear canal and external ear normal.     Nose: No nasal deformity, septal deviation, mucosal edema or rhinorrhea.     Right Turbinates: Enlarged and swollen.     Left Turbinates: Enlarged and swollen.     Right Sinus: No maxillary sinus tenderness or frontal sinus tenderness.     Left Sinus: No maxillary sinus tenderness or frontal sinus tenderness.     Mouth/Throat:     Mouth: Mucous membranes are not pale and not dry.     Pharynx: Uvula midline.  Eyes:     General:        Right eye: No discharge.        Left eye: No discharge.      Conjunctiva/sclera: Conjunctivae normal.     Right eye: Right conjunctiva is not injected. No chemosis.    Left eye: Left conjunctiva is not injected. No chemosis.    Pupils: Pupils are equal, round, and reactive to light.  Cardiovascular:     Rate and Rhythm: Normal rate and regular rhythm.     Heart sounds: Normal heart sounds.  Pulmonary:     Effort: Pulmonary effort is normal. No tachypnea, accessory muscle usage or respiratory distress.     Breath sounds: Normal breath sounds. No wheezing, rhonchi or rales.  Chest:     Chest wall: No tenderness.  Lymphadenopathy:     Cervical: No cervical adenopathy.  Skin:    Coloration: Skin is not pale.     Findings: No abrasion, erythema, petechiae or rash. Rash is not papular, urticarial or vesicular.     Comments: No eczematous lesions or urticaria lesions noted today.   Neurological:     Mental Status: She is alert.  Psychiatric:        Behavior: Behavior is cooperative.  Diagnostic studies:    Spirometry: results normal (FEV1: 2.57/100%, FVC: 3.28/100%, FEV1/FVC: 78%).    Spirometry consistent with normal pattern.   Allergy Studies: none      Salvatore Marvel, MD  Allergy and [facility] [place]

## 2020-07-02 ENCOUNTER — Other Ambulatory Visit: Payer: Self-pay

## 2020-07-02 ENCOUNTER — Ambulatory Visit
Admission: RE | Admit: 2020-07-02 | Discharge: 2020-07-02 | Disposition: A | Discharge status: Home / Self Care | Payer: PRIVATE HEALTH INSURANCE | Source: Ambulatory Visit | Attending: Family Medicine | Admitting: Family Medicine

## 2020-07-02 VITALS — BP 142/99 | HR 88 | Temp 98.2°F | Resp 18

## 2020-07-02 DIAGNOSIS — J029 Acute pharyngitis, unspecified: Secondary | ICD-10-CM | POA: Diagnosis not present

## 2020-07-02 LAB — GROUP A STREP BY PCR: Group A Strep by PCR: NOT DETECTED

## 2020-07-02 NOTE — ED Triage Notes (Signed)
Pt is here with a sore throat that started 2 days ago, pt has not taken any meds to relieve discomfort only her allergy meds.

## 2020-07-02 NOTE — Discharge Instructions (Signed)
Warm salt water gargles.  Tylenol/Ibuprofen as needed.  We will call with results.  Take care  Dr. Lacinda Axon

## 2020-07-02 NOTE — ED Provider Notes (Signed)
[facility]    CSN: [id number] Arrival date & time: 07/02/20  3354  History   Chief Complaint Chief Complaint  Patient presents with   (APPT 9AM) Sore throat   HPI  59 year old female presents with sore throat.  Started 2 days ago.  Patient reports associated fatigue.  No other respiratory symptoms.  No fever.  No relieving factors.  Patient states that she will be traveling and wanted to make sure she did have strep throat.  Would like a test today.  No other associated symptoms.  No other complaints.  Past Medical History:  Diagnosis Date   Anxiety    Asthma    allergy induced asthma   Depression    Dysphagia    Environmental allergies    Hernia of abdominal wall    Herpes labialis    History of hiatal hernia    Lumbar herniated disc    Panic attacks    Urinary incontinence    Urticaria     Patient Active Problem List   Diagnosis Date Noted   Esophageal dysphagia    Hiatal hernia    Anxiety 11/08/2018   Seasonal and perennial allergic rhinitis 02/26/2018   Displacement of lumbar intervertebral disc without myelopathy 12/28/2016   Low back pain 12/28/2016   Ventral incisional hernia without gangrene    Viral pharyngitis 05/05/2016   Sore throat 05/04/2016   Strep throat 04/19/2016   Chronic urticaria 01/17/2016   Acute cholecystitis 11/10/2015   Cholecystitis    History of colon polyps 10/15/2015   Chronic anxiety 10/05/2015   Diaphragmatic hernia 10/05/2015   Insomnia 10/05/2015   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 10/05/2015   Prinzmetal angina ([place]) 10/05/2015   Recurrent major depressive disorder ([place]) 10/05/2015   Chronic idiopathic urticaria 07/12/2015   Mild intermittent asthma without complication 56/25/6389   Detrusor muscle hypertonia 06/02/2015   Vitamin D deficiency 06/02/2015   Mild persistent asthma 05/17/2015   Other allergic rhinitis 05/17/2015   Idiopathic urticaria  05/17/2015   GERD (gastroesophageal reflux disease) 05/17/2015   Gastroesophageal reflux disease without esophagitis 05/17/2015    Past Surgical History:  Procedure Laterality Date   ABDOMINAL SURGERY  2001   MYOMECTOMY    BLADDER SUSPENSION  2003   [place] transobturator pubovaginal sling Dr Gaynelle Arabian   BREAST BIOPSY Right 12/21/2010   [place]   CHOLECYSTECTOMY N/A 11/10/2015   Procedure: LAPAROSCOPIC CHOLECYSTECTOMY;  Surgeon: Florene Glen, MD;  Location: [facility];  Service: General;  Laterality: N/A;   COLONOSCOPY  2015   Dr Dorrene German [place]   COLONOSCOPY WITH PROPOFOL N/A 05/30/2019   Procedure: COLONOSCOPY WITH PROPOFOL;  Surgeon: Virgel Manifold, MD;  Location: [facility];  Service: Endoscopy;  Laterality: N/A;   DILATION AND CURETTAGE OF UTERUS     ESOPHAGOGASTRODUODENOSCOPY (EGD) WITH PROPOFOL N/A 01/23/2019   Procedure: ESOPHAGOGASTRODUODENOSCOPY (EGD) WITH PROPOFOL;  Surgeon: Virgel Manifold, MD;  Location: [facility];  Service: Endoscopy;  Laterality: N/A;   HYSTEROSCOPY     D&C, HYST X 3   VENTRAL HERNIA REPAIR N/A 12/26/2016   Procedure: HERNIA REPAIR VENTRAL ADULT;  Surgeon: Florene Glen, MD;  Location: [facility];  Service: General;  Laterality: N/A;    OB History    Gravida  3   Para  1   Term  1   Preterm      AB  2   Living  1     SAB  2   TAB  Ectopic      Multiple      Live Births  1            Home Medications    Prior to Admission medications   Medication Sig Start Date End Date Taking? Authorizing Provider  acyclovir (ZOVIRAX) 200 MG/5ML suspension SHAKE LIQUID AND TAKE 5 ML(200 MG) BY MOUTH FIVE TIMES DAILY Patient not taking: Reported on 06/03/2020 04/07/20   Joseph Pierini, MD  albuterol (VENTOLIN HFA) 108 (90 Base) MCG/ACT inhaler INHALE 2 PUFFS BY MOUTH EVERY 4 HOURS AS NEEDED FOR WHEEZING OR COUGHING SPELLS 06/03/20   Valentina Shaggy, MD  ALPRAZolam Duanne Moron) 1 MG tablet Take 0.5-1 mg  by mouth 3 (three) times daily as needed for anxiety.     [provider]  azelastine (OPTIVAR) 0.05 % ophthalmic solution Place 1 drop into both eyes 2 (two) times daily as needed (for itchy eyes). 06/03/20   Valentina Shaggy, MD  cetirizine (ZYRTEC) 10 MG tablet Take 1 tablet (10 mg total) by mouth at bedtime. 06/03/20   Valentina Shaggy, MD  Cholecalciferol (VITAMIN D3) 3000 units TABS Take 3,000 Units by mouth daily.    [provider]  Crisaborole (EUCRISA) 2 % OINT Apply 1 application topically in the morning and at bedtime. Patient not taking: Reported on 06/03/2020 03/01/20   Valentina Shaggy, MD  EPINEPHrine (EPIPEN 2-PAK) 0.3 mg/0.3 mL IJ SOAJ injection Use as directed for severe allergic reaction. 02/11/19   Charlies Silvers, MD  estradiol (VIVELLE-DOT) 0.1 MG/24HR patch APPLY 1 PATCH(0.1 MG) EXTERNALLY TO THE SKIN 2 TIMES A WEEK 02/03/20   Joseph Pierini, MD  famotidine (PEPCID) 20 MG tablet Take 1 tablet (20 mg total) by mouth 2 (two) times daily. 06/03/20   Valentina Shaggy, MD  fexofenadine (ALLEGRA) 180 MG tablet Take 1 tablet (180 mg total) by mouth daily. 06/03/20   Valentina Shaggy, MD  fluconazole (DIFLUCAN) 150 MG tablet Take one tab po weekly x one month. Patient not taking: Reported on 03/01/2020 02/10/20   Joseph Pierini, MD  Folic Acid-Cholecalciferol 08-2498 MG-UNIT TABS Take by mouth. Patient not taking: Reported on 06/03/2020    [provider]  hydrocortisone 2.5 % cream Use twice a day if needed to red itchy areas 11/25/19   Charlies Silvers, MD  hydrOXYzine (ATARAX/VISTARIL) 25 MG tablet Take by mouth. Patient not taking: Reported on 03/01/2020 02/20/20   [provider]  ipratropium-albuterol (DUONEB) 0.5-2.5 (3) MG/3ML SOLN Take 3 mLs by nebulization every 6 (six) hours as needed. 06/03/20   Valentina Shaggy, MD  omeprazole (PRILOSEC) 40 MG capsule Take 40 mg by mouth daily.     [provider]    progesterone (PROMETRIUM) 100 MG capsule Take 1 capsule (100 mg total) by mouth daily. 07/14/19   Fontaine, Belinda Block, MD  valACYclovir (VALTREX) 500 MG tablet Take 500 mg by mouth 2 (two) times daily.    [provider]  medroxyPROGESTERone (PROVERA) 2.5 MG tablet Take 1 tablet (2.5 mg total) by mouth daily. 03/25/19 11/20/19  Fontaine, Belinda Block, MD    Family History Family History  Problem Relation Age of Onset   Cancer Maternal Uncle        2 MATERNAL UNCLES W COLON CA   Heart disease Maternal Grandmother    Hypertension Maternal Grandmother    Cancer Maternal Grandfather        Lung   Cancer Paternal Grandfather  Lung   Myasthenia gravis Father    Alzheimer's disease Father    Healthy Mother    Allergic rhinitis Neg Hx    Angioedema Neg Hx    Asthma Neg Hx    Immunodeficiency Neg Hx    Eczema Neg Hx    Urticaria Neg Hx     Social History Social History   Tobacco Use   Smoking status: Former Smoker    Packs/day: 1.00    Types: Cigarettes    Quit date: 08/13/2012    Years since quitting: 7.8   Smokeless tobacco: Never Used  Vaping Use   Vaping Use: Never used  Substance Use Topics   Alcohol use: Yes    Comment: Wine daily   Drug use: No     Allergies   Latex, Clonazepam, Escitalopram, Other, Codeine, and Montelukast   Review of Systems Review of Systems  Constitutional: Positive for fatigue.  HENT: Positive for sore throat.    Physical Exam Triage Vital Signs ED Triage Vitals  Enc Vitals Group     BP 07/02/20 0923 (!) 142/99     Pulse Rate 07/02/20 0923 88     Resp 07/02/20 0923 18     Temp 07/02/20 0923 98.2 F (36.8 C)     Temp Source 07/02/20 0923 Oral     SpO2 07/02/20 0923 100 %     Weight --      Height --      Head Circumference --      Peak Flow --      Pain Score 07/02/20 0921 0     Pain Loc --      Pain Edu? --      Excl. in [place]? --    Updated Vital Signs BP (!) 142/99 (BP Location: Left Arm)     Pulse 88    Temp 98.2 F (36.8 C) (Oral)    Resp 18    LMP 04/12/2012    SpO2 100%   Visual Acuity Right Eye Distance:   Left Eye Distance:   Bilateral Distance:    Right Eye Near:   Left Eye Near:    Bilateral Near:     Physical Exam Vitals and nursing note reviewed.  Constitutional:      General: She is not in acute distress.    Appearance: Normal appearance. She is not ill-appearing.  HENT:     Head: Normocephalic and atraumatic.     Right Ear: Tympanic membrane normal.     Left Ear: Tympanic membrane normal.     Mouth/Throat:     Pharynx: Posterior oropharyngeal erythema present. No oropharyngeal exudate.  Cardiovascular:     Rate and Rhythm: Normal rate and regular rhythm.     Heart sounds: No murmur heard.   Neurological:     Mental Status: She is alert.  Psychiatric:        Mood and Affect: Mood normal.        Behavior: Behavior normal.    UC Treatments / Results  Labs (all labs ordered are listed, but only abnormal results are displayed) Labs Reviewed  GROUP A STREP BY PCR    EKG   Radiology No results found.  Procedures Procedures (including critical care time)  Medications Ordered in UC Medications - No data to display  Initial Impression / Assessment and Plan / UC Course  I have reviewed the triage vital signs and the nursing notes.  Pertinent labs & imaging results that were available during my care  of the patient were reviewed by me and considered in my medical decision making (see chart for details).    59 year old female presents with viral pharyngitis.  Strep negative.  Advised warm salt water gargles, Tylenol ibuprofen.  Supportive care.  Final Clinical Impressions(s) / UC Diagnoses   Final diagnoses:  Acute pharyngitis, unspecified etiology     Discharge Instructions     Warm salt water gargles.  Tylenol/Ibuprofen as needed.  We will call with results.  Take care  Dr. Lacinda Axon    ED Prescriptions    None     PDMP not  reviewed this encounter.   Coral Spikes, [place] 07/02/20 1007

## 2020-08-20 ENCOUNTER — Encounter: Payer: Self-pay | Admitting: Allergy & Immunology

## 2020-09-16 ENCOUNTER — Telehealth: Payer: Self-pay | Admitting: *Deleted

## 2020-09-16 NOTE — Telephone Encounter (Signed)
L./m for patient to contact me so I can advise Ins approval, copay card and submit if patient wants to proceed

## 2020-09-17 NOTE — Telephone Encounter (Signed)
Patient returned call and I advised approval and copay card and submit to [facility]

## 2020-09-24 NOTE — Telephone Encounter (Signed)
Patient wanted to know when she could schedule an appointment for her Xolair. Informed patient the medication has not been ordered yet and was unsure how long it would take to reach the office and someone will reach out to her to schedule probably next week.   Please advise.

## 2020-09-27 NOTE — Telephone Encounter (Signed)
Called patient and advised submitted to [employer] and awaiting them to contact her for ok to ship and number given

## 2020-10-04 ENCOUNTER — Other Ambulatory Visit: Payer: Self-pay

## 2020-10-04 ENCOUNTER — Ambulatory Visit (INDEPENDENT_AMBULATORY_CARE_PROVIDER_SITE_OTHER): Payer: PRIVATE HEALTH INSURANCE

## 2020-10-04 DIAGNOSIS — L501 Idiopathic urticaria: Secondary | ICD-10-CM

## 2020-10-04 MED ORDER — OMALIZUMAB 150 MG ~~LOC~~ SOLR
300.0000 mg | SUBCUTANEOUS | Status: DC
  Administered 2020-10-04 – 2021-07-26 (×9): 300 mg via SUBCUTANEOUS

## 2020-11-01 ENCOUNTER — Other Ambulatory Visit: Payer: Self-pay

## 2020-11-01 ENCOUNTER — Ambulatory Visit (INDEPENDENT_AMBULATORY_CARE_PROVIDER_SITE_OTHER): Payer: PRIVATE HEALTH INSURANCE | Admitting: *Deleted

## 2020-11-01 DIAGNOSIS — L501 Idiopathic urticaria: Secondary | ICD-10-CM | POA: Diagnosis not present

## 2020-11-29 ENCOUNTER — Ambulatory Visit (INDEPENDENT_AMBULATORY_CARE_PROVIDER_SITE_OTHER): Payer: PRIVATE HEALTH INSURANCE | Admitting: *Deleted

## 2020-11-29 ENCOUNTER — Other Ambulatory Visit: Payer: Self-pay

## 2020-11-29 DIAGNOSIS — L501 Idiopathic urticaria: Secondary | ICD-10-CM | POA: Diagnosis not present

## 2020-12-02 ENCOUNTER — Ambulatory Visit: Payer: PRIVATE HEALTH INSURANCE | Admitting: Allergy & Immunology

## 2020-12-08 ENCOUNTER — Encounter: Payer: Self-pay | Admitting: Allergy & Immunology

## 2020-12-14 ENCOUNTER — Ambulatory Visit: Payer: PRIVATE HEALTH INSURANCE | Admitting: Allergy & Immunology

## 2020-12-27 ENCOUNTER — Ambulatory Visit: Payer: Self-pay

## 2021-01-03 ENCOUNTER — Ambulatory Visit (INDEPENDENT_AMBULATORY_CARE_PROVIDER_SITE_OTHER): Payer: PRIVATE HEALTH INSURANCE

## 2021-01-03 ENCOUNTER — Other Ambulatory Visit: Payer: Self-pay

## 2021-01-03 DIAGNOSIS — L501 Idiopathic urticaria: Secondary | ICD-10-CM

## 2021-01-25 ENCOUNTER — Ambulatory Visit (INDEPENDENT_AMBULATORY_CARE_PROVIDER_SITE_OTHER): Payer: PRIVATE HEALTH INSURANCE | Admitting: Allergy & Immunology

## 2021-01-25 ENCOUNTER — Other Ambulatory Visit: Payer: Self-pay

## 2021-01-25 ENCOUNTER — Encounter: Payer: Self-pay | Admitting: Allergy & Immunology

## 2021-01-25 VITALS — BP 138/88 | HR 89 | Temp 98.7°F

## 2021-01-25 DIAGNOSIS — J4520 Mild intermittent asthma, uncomplicated: Secondary | ICD-10-CM | POA: Diagnosis not present

## 2021-01-25 DIAGNOSIS — J3089 Other allergic rhinitis: Secondary | ICD-10-CM | POA: Diagnosis not present

## 2021-01-25 DIAGNOSIS — T63481D Toxic effect of venom of other arthropod, accidental (unintentional), subsequent encounter: Secondary | ICD-10-CM

## 2021-01-25 DIAGNOSIS — K219 Gastro-esophageal reflux disease without esophagitis: Secondary | ICD-10-CM | POA: Diagnosis not present

## 2021-01-25 DIAGNOSIS — J302 Other seasonal allergic rhinitis: Secondary | ICD-10-CM

## 2021-01-25 DIAGNOSIS — L501 Idiopathic urticaria: Secondary | ICD-10-CM | POA: Diagnosis not present

## 2021-01-25 MED ORDER — ALBUTEROL SULFATE HFA 108 (90 BASE) MCG/ACT IN AERS: INHALATION_SPRAY | RESPIRATORY_TRACT | 2 refills | Status: DC

## 2021-01-25 MED ORDER — IPRATROPIUM-ALBUTEROL 0.5-2.5 (3) MG/3ML IN SOLN: 3.0000 mL | Freq: Four times a day (QID) | RESPIRATORY_TRACT | 3 refills | Status: DC | PRN

## 2021-01-25 MED ORDER — EPINEPHRINE 0.3 MG/0.3ML IJ SOAJ: INTRAMUSCULAR | 1 refills | Status: DC

## 2021-01-25 NOTE — Patient Instructions (Addendum)
1. Chronic idiopathic urticaria - Continue with Xolair every 28 weeks.  - Stop the Pepcid (famotidine) since this has the LEAST amount of data to support its use in hives.  - Continue with Allegra in the morning and Zyrtec at night.  - You can wait another month or two and stop one of the other antihistamines (stop Allegra maybe since it is the most expensive).   2. Allergic reaction to insect sting - EpiPen is up to date.  4. Mild intermittent asthma without complication - Lung testing looks awesome. - Continue with albuterol as needed.   5. Seasonal and perennial allergic rhinitis - Continue with the allergy medications, as above.  6. Return in about 1 year (around 01/25/2022).    Please inform us of any Emergency Department visits, hospitalizations, or changes in symptoms. Call us before going to the ED for breathing or allergy symptoms since we might be able to fit you in for a sick visit. Feel free to contact us anytime with any questions, problems, or concerns.  It was a pleasure to see you again today!  Websites that have reliable patient information: 1. American Academy of Asthma, Allergy, and Immunology: www.aaaai.org 2. Food Allergy Research and Education (FARE): foodallergy.org 3. Mothers of Asthmatics: http://www.asthmacommunitynetwork.org 4. American College of Allergy, Asthma, and Immunology: www.acaai.org   COVID-19 Vaccine Information can be found at: ShippingScam.co.uk For questions related to vaccine distribution or appointments, please email vaccine@[email or web address] [email or web address] or call [phone].   We realize that you might be concerned about having an allergic reaction to the COVID19 vaccines. To help with that concern, WE ARE OFFERING THE COVID19 VACCINES IN OUR OFFICE! Ask the front desk for dates!     "Like" [place] on Facebook and Instagram for our latest updates!      A healthy democracy works best when [employer] participate! Make sure you are registered to vote! If you have moved or changed any of your contact information, you will need to get this updated before voting!  In some cases, you MAY be able to register to vote online: [email or web address]

## 2021-01-25 NOTE — Progress Notes (Signed)
FOLLOW UP  Date of Service/Encounter:  01/25/21   Assessment:   Chronic idiopathic urticaria - needing 4 or 5 courses of prednisone annually for control   Gastroesophageal reflux disease   Allergic reaction to insect sting - unclear whether this is confirmed with testing or not   Mild intermittent asthma without complication - did not do any pulmonary function testing today   Seasonal and perennial allergic rhinitis  Plan/Recommendations:   1. Chronic idiopathic urticaria - Continue with Xolair every 28 weeks.  - Stop the Pepcid (famotidine) since this has the LEAST amount of data to support its use in hives.  - Continue with Allegra in the morning and Zyrtec at night.  - You can wait another month or two and stop one of the other antihistamines (stop Allegra maybe since it is the most expensive).   2. Allergic reaction to insect sting - EpiPen is up to date.  4. Mild intermittent asthma without complication - Lung testing not done all.  - Continue with Ventolin as needed.   5. Seasonal and perennial allergic rhinitis - Continue with the allergy medications.  6. Return in about 1 year (around 01/25/2022).   Subjective:   Hannah [name] is a 60 y.o. female presenting today for follow up of  Chief Complaint  Patient presents with   Asthma    Hannah [name] has a history of the following: Patient Active Problem List   Diagnosis Date Noted   Esophageal dysphagia    Hiatal hernia    Anxiety 11/08/2018   Seasonal and perennial allergic rhinitis 02/26/2018   Displacement of lumbar intervertebral disc without myelopathy 12/28/2016   Low back pain 12/28/2016   Ventral incisional hernia without gangrene    Viral pharyngitis 05/05/2016   Sore throat 05/04/2016   Strep throat 04/19/2016   Chronic urticaria 01/17/2016   Acute cholecystitis 11/10/2015   Cholecystitis    History of colon polyps 10/15/2015   Chronic anxiety 10/05/2015   Diaphragmatic hernia  10/05/2015   Insomnia 10/05/2015   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  Hannah [name] is a 60 y.o. female presenting for a follow up visit.  She was last seen in October 2021.  At that time, we talked about starting Xolair for her idiopathic urticaria.  We continue with the Zyrtec and decrease to 1 tablet twice daily and continue with famotidine daily.  For her asthma, her lung testing looked good. P/SAR was well controlled with allergy medications.  Since the last visit, she has started Xolair. 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 It took her until her third injection until it worked completely. She remains on Allegra and famotidine in the morning and Zyrtec at night.   Asthma/Respiratory Symptom History: She is doing well with her asthma. [name]'s asthma has been well controlled. She has not required rescue medication, experienced nocturnal awakenings due to lower respiratory symptoms, nor have activities of daily living been limited. She has required no Emergency Department or  Urgent Care visits for her asthma. She has required zero courses of systemic steroids for asthma exacerbations since the last visit. ACT score today is 25, indicating excellent asthma symptom control.   Allergic Rhinitis Symptom History: Her allergic rhinitis symptoms are under good control with antihistamines.  She does not regularly use a nose spray.   She has not needed any antibiotics for sinusitis or ear infections.  Eczema Symptom History: Her Xolair has been phenomenal for her urticaria.  She is getting it every 28 days.  She does get it in our office.  She is due for her injection next Monday.  She would like to get it today since she is here.  She lives in [place] and takes her about an hour to get here. She does not come to [facility] for anything else.  Otherwise, there have been no changes to her past medical history, surgical history, family history, or social history.    Review of Systems  Constitutional: Negative.  Negative for chills, fever, malaise/fatigue and weight loss.  HENT: Negative.  Negative for congestion, ear discharge and ear pain.   Eyes:  Negative for pain, discharge and redness.  Respiratory:  Negative for cough, sputum production, shortness of breath and wheezing.   Cardiovascular: Negative.  Negative for chest pain and palpitations.  Gastrointestinal:  Negative for abdominal pain, constipation, diarrhea, heartburn, nausea and vomiting.  Skin: Negative.  Negative for itching and rash.  Neurological:  Negative for dizziness and headaches.  Endo/Heme/Allergies:  Negative for environmental allergies. Does not bruise/bleed easily.      Objective:   Last menstrual period 04/12/2012. There is no height or weight on file to calculate BMI.   Physical Exam:  Physical Exam Constitutional:      Appearance: She is well-developed.     Comments: Very thankful.  HENT:     Head: Normocephalic and atraumatic.     Right Ear: Tympanic membrane, ear canal and external ear normal.     Left Ear: Tympanic membrane, ear canal and external ear normal.     Nose: No nasal deformity, septal deviation, mucosal edema or rhinorrhea.     Right Turbinates: Not enlarged or swollen.     Left Turbinates: Not enlarged or swollen.     Right Sinus: No maxillary sinus tenderness or frontal sinus tenderness.     Left Sinus: No  maxillary sinus tenderness or frontal sinus tenderness.     Mouth/Throat:     Mouth: Mucous membranes are not pale and not dry.     Pharynx: Uvula midline.  Eyes:     General: Lids are normal. No allergic shiner.       Right eye: No discharge.        Left eye: No discharge.     Conjunctiva/sclera: Conjunctivae normal.     Right eye: Right conjunctiva is not injected. No chemosis.    Left eye: Left conjunctiva is not injected. No chemosis.    Pupils: Pupils are equal, round, and reactive to light.  Cardiovascular:     Rate and Rhythm: Normal rate and regular rhythm.     Heart sounds: Normal heart sounds.  Pulmonary:     Effort: Pulmonary effort is normal. No tachypnea, accessory muscle usage or respiratory distress.     Breath sounds: Normal breath sounds. No wheezing, rhonchi or rales.     Comments: Moving air well in all lung fields.   Chest:     Chest wall: No tenderness.  Lymphadenopathy:  Cervical: No cervical adenopathy.  Skin:    General: Skin is warm.     Capillary Refill: Capillary refill takes less than 2 seconds.     Coloration: Skin is not pale.     Findings: No abrasion, erythema, petechiae or rash. Rash is not papular, urticarial or vesicular.     Comments: No eczematous or urticarial lesions noted.   Neurological:     Mental Status: She is alert.  Psychiatric:        Behavior: Behavior is cooperative.     Diagnostic studies: none      Salvatore Marvel, MD  Allergy and [employer]

## 2021-01-26 ENCOUNTER — Encounter: Payer: Self-pay | Admitting: Allergy & Immunology

## 2021-01-30 ENCOUNTER — Encounter: Payer: Self-pay | Admitting: Allergy & Immunology

## 2021-01-31 ENCOUNTER — Other Ambulatory Visit: Payer: Self-pay | Admitting: *Deleted

## 2021-01-31 ENCOUNTER — Ambulatory Visit: Payer: Self-pay

## 2021-01-31 MED ORDER — ALBUTEROL SULFATE HFA 108 (90 BASE) MCG/ACT IN AERS: INHALATION_SPRAY | RESPIRATORY_TRACT | 2 refills | Status: DC

## 2021-02-07 ENCOUNTER — Encounter: Payer: Self-pay | Admitting: Allergy & Immunology

## 2021-02-08 ENCOUNTER — Other Ambulatory Visit: Payer: Self-pay | Admitting: *Deleted

## 2021-02-08 MED ORDER — CLOBETASOL PROPIONATE 0.05 % EX OINT: TOPICAL_OINTMENT | CUTANEOUS | 0 refills | Status: DC

## 2021-02-09 DIAGNOSIS — Z7689 Persons encountering health services in other specified circumstances: Secondary | ICD-10-CM | POA: Insufficient documentation

## 2021-02-18 ENCOUNTER — Other Ambulatory Visit: Payer: Self-pay | Admitting: *Deleted

## 2021-02-18 ENCOUNTER — Encounter: Payer: Self-pay | Admitting: Allergy & Immunology

## 2021-02-18 MED ORDER — PREDNISONE 10 MG PO TABS: ORAL_TABLET | ORAL | 0 refills | Status: DC

## 2021-02-28 ENCOUNTER — Ambulatory Visit (INDEPENDENT_AMBULATORY_CARE_PROVIDER_SITE_OTHER): Payer: PRIVATE HEALTH INSURANCE | Admitting: *Deleted

## 2021-02-28 ENCOUNTER — Other Ambulatory Visit: Payer: Self-pay

## 2021-02-28 DIAGNOSIS — L501 Idiopathic urticaria: Secondary | ICD-10-CM | POA: Diagnosis not present

## 2021-02-28 MED ORDER — OMALIZUMAB 150 MG/ML ~~LOC~~ SOSY
150.0000 mg | PREFILLED_SYRINGE | SUBCUTANEOUS | Status: DC
  Administered 2021-02-28: 150 mg via SUBCUTANEOUS

## 2021-03-17 ENCOUNTER — Other Ambulatory Visit: Payer: Self-pay | Admitting: Obstetrics & Gynecology

## 2021-03-17 DIAGNOSIS — Z1231 Encounter for screening mammogram for malignant neoplasm of breast: Secondary | ICD-10-CM

## 2021-03-28 ENCOUNTER — Other Ambulatory Visit: Payer: Self-pay

## 2021-03-28 ENCOUNTER — Ambulatory Visit (INDEPENDENT_AMBULATORY_CARE_PROVIDER_SITE_OTHER): Payer: PRIVATE HEALTH INSURANCE

## 2021-03-28 DIAGNOSIS — L501 Idiopathic urticaria: Secondary | ICD-10-CM | POA: Diagnosis not present

## 2021-03-29 DIAGNOSIS — U071 COVID-19: Secondary | ICD-10-CM | POA: Insufficient documentation

## 2021-04-01 ENCOUNTER — Other Ambulatory Visit (HOSPITAL_COMMUNITY): Payer: Self-pay

## 2021-04-01 ENCOUNTER — Ambulatory Visit (INDEPENDENT_AMBULATORY_CARE_PROVIDER_SITE_OTHER): Payer: PRIVATE HEALTH INSURANCE | Admitting: *Deleted

## 2021-04-01 DIAGNOSIS — U071 COVID-19: Secondary | ICD-10-CM | POA: Diagnosis not present

## 2021-04-01 DIAGNOSIS — J984 Other disorders of lung: Secondary | ICD-10-CM

## 2021-04-01 MED ORDER — SODIUM CHLORIDE 0.9 % IV SOLN: INTRAVENOUS | Status: DC | PRN

## 2021-04-01 MED ORDER — EPINEPHRINE 0.3 MG/0.3ML IJ SOAJ: 0.3000 mg | Freq: Once | INTRAMUSCULAR | Status: AC | PRN

## 2021-04-01 MED ORDER — ALBUTEROL SULFATE HFA 108 (90 BASE) MCG/ACT IN AERS: 2.0000 | INHALATION_SPRAY | Freq: Once | RESPIRATORY_TRACT | Status: AC | PRN

## 2021-04-01 MED ORDER — DIPHENHYDRAMINE HCL 50 MG/ML IJ SOLN: 50.0000 mg | Freq: Once | INTRAMUSCULAR | Status: AC | PRN

## 2021-04-01 MED ORDER — BEBTELOVIMAB 175 MG/2 ML IV (EUA)
175.0000 mg | Freq: Once | INTRAMUSCULAR | Status: AC
  Administered 2021-04-01: 175 mg via INTRAVENOUS

## 2021-04-01 MED ORDER — METHYLPREDNISOLONE SODIUM SUCC 125 MG IJ SOLR: 125.0000 mg | Freq: Once | INTRAMUSCULAR | Status: AC | PRN

## 2021-04-01 MED ORDER — FAMOTIDINE IN NACL 20-0.9 MG/50ML-% IV SOLN: 20.0000 mg | Freq: Once | INTRAVENOUS | Status: AC | PRN

## 2021-04-01 NOTE — Patient Instructions (Addendum)
10 Things You Can Do to Manage Your COVID-19 Symptoms at Home If you have possible or confirmed COVID-19 Stay home except to get medical care. Monitor your symptoms carefully. If your symptoms get worse, call your healthcare provider immediately. Get rest and stay hydrated. If you have a medical appointment, call the healthcare provider ahead of time and tell them that you have or may have COVID-19. For medical emergencies, call 911 and notify the dispatch personnel that you have or may have COVID-19. Cover your cough and sneezes with a tissue or use the inside of your elbow. Wash your hands often with soap and water for at least 20 seconds or clean your hands with an alcohol-based hand sanitizer that contains at least 60% alcohol. As much as possible, stay in a specific room and away from other people in your home. Also, you should use a separate bathroom, if available. If you need to be around other people in or outside of the home, wear a mask. Avoid sharing personal items with other people in your household, like dishes, towels, and bedding. Clean all surfaces that are touched often, like counters, tabletops, and doorknobs. Use household cleaning sprays or wipes according to the label instructions. cdc.gov/coronavirus What types of side effects do monoclonal antibody drugs cause?  Common side effects  In general, the more common side effects caused by monoclonal antibody drugs include: Allergic reactions, such as hives or itching Flu-like signs and symptoms, including chills, fatigue, fever, and muscle aches and pains Nausea, vomiting Diarrhea Skin rashes Low blood pressure   The CDC is recommending patients who receive monoclonal antibody treatments wait at least 90 days before being vaccinated.  Currently, there are no data on the safety and efficacy of mRNA COVID-19 vaccines in persons who received monoclonal antibodies or convalescent plasma as part of COVID-19 treatment. Based on  the estimated half-life of such therapies as well as evidence suggesting that reinfection is uncommon in the 90 days after initial infection, vaccination should be deferred for at least 90 days, as a precautionary measure until additional information becomes available, to avoid interference of the antibody treatment with vaccine-induced immune responses.  02/27/2020 This information is not intended to replace advice given to you by your health care provider. Make sure you discuss any questions you have with your healthcare provider. Document Revised: 09/17/2020 Document Reviewed: 09/17/2020 Elsevier Patient Education  2022 Elsevier Inc.  

## 2021-04-01 NOTE — Progress Notes (Signed)
Diagnosis: COVID  Provider:  Marshell Garfinkel, MD  Procedure: Infusion  IV Type: Peripheral, IV Location: R Forearm  Bebtelovimab, Dose: 175 mg  Infusion Start Time: 1354pm  Infusion Stop Time: 1355pm  Post Infusion IV Care: Observation period completed and Peripheral IV Discontinued  Discharge: Condition: Good, Destination: Home . AVS provided to patient.   Performed by:  Oren Beckmann, RN

## 2021-04-05 NOTE — Addendum Note (Signed)
Addended by: Carin Hock on: 04/05/2021 05:49 PM   Modules accepted: Orders

## 2021-04-07 ENCOUNTER — Ambulatory Visit: Payer: PRIVATE HEALTH INSURANCE | Admitting: Obstetrics & Gynecology

## 2021-04-15 ENCOUNTER — Ambulatory Visit
Admission: RE | Admit: 2021-04-15 | Discharge: 2021-04-15 | Disposition: A | Discharge status: Home / Self Care | Payer: PRIVATE HEALTH INSURANCE | Source: Ambulatory Visit | Attending: Obstetrics & Gynecology | Admitting: Obstetrics & Gynecology

## 2021-04-15 ENCOUNTER — Other Ambulatory Visit: Payer: Self-pay

## 2021-04-15 DIAGNOSIS — Z1231 Encounter for screening mammogram for malignant neoplasm of breast: Secondary | ICD-10-CM

## 2021-04-25 ENCOUNTER — Ambulatory Visit (INDEPENDENT_AMBULATORY_CARE_PROVIDER_SITE_OTHER): Payer: 59 | Admitting: *Deleted

## 2021-04-25 ENCOUNTER — Ambulatory Visit: Payer: PRIVATE HEALTH INSURANCE

## 2021-04-25 ENCOUNTER — Other Ambulatory Visit: Payer: Self-pay

## 2021-04-25 DIAGNOSIS — L501 Idiopathic urticaria: Secondary | ICD-10-CM

## 2021-05-27 ENCOUNTER — Other Ambulatory Visit: Payer: Self-pay

## 2021-05-27 ENCOUNTER — Ambulatory Visit (INDEPENDENT_AMBULATORY_CARE_PROVIDER_SITE_OTHER): Payer: 59 | Admitting: Obstetrics & Gynecology

## 2021-05-27 ENCOUNTER — Ambulatory Visit (INDEPENDENT_AMBULATORY_CARE_PROVIDER_SITE_OTHER): Payer: 59 | Admitting: *Deleted

## 2021-05-27 ENCOUNTER — Other Ambulatory Visit (HOSPITAL_COMMUNITY)
Admission: RE | Admit: 2021-05-27 | Discharge: 2021-05-27 | Disposition: A | Discharge status: Home / Self Care | Payer: PRIVATE HEALTH INSURANCE | Source: Ambulatory Visit | Attending: Obstetrics & Gynecology | Admitting: Obstetrics & Gynecology

## 2021-05-27 ENCOUNTER — Encounter: Payer: Self-pay | Admitting: Obstetrics & Gynecology

## 2021-05-27 VITALS — BP 110/78 | HR 90 | Resp 16 | Ht 63.75 in | Wt 163.0 lb

## 2021-05-27 DIAGNOSIS — Z79890 Hormone replacement therapy (postmenopausal): Secondary | ICD-10-CM | POA: Diagnosis not present

## 2021-05-27 DIAGNOSIS — N950 Postmenopausal bleeding: Secondary | ICD-10-CM | POA: Diagnosis not present

## 2021-05-27 DIAGNOSIS — L501 Idiopathic urticaria: Secondary | ICD-10-CM

## 2021-05-27 DIAGNOSIS — Z01419 Encounter for gynecological examination (general) (routine) without abnormal findings: Secondary | ICD-10-CM

## 2021-05-27 MED ORDER — ESTRADIOL 0.05 MG/24HR TD PTTW: 1.0000 | MEDICATED_PATCH | TRANSDERMAL | 4 refills | Status: DC

## 2021-05-27 MED ORDER — PROGESTERONE 200 MG PO CAPS: 200.0000 mg | ORAL_CAPSULE | Freq: Every day | ORAL | 4 refills | Status: DC

## 2021-05-27 NOTE — Progress Notes (Signed)
Hannah [name] [birth year] [id number]   History:    60 y.o. G3P1A2L1  RP:  Established patient presenting for annual gyn exam   HPI: Postmenopausal.  Hot flashes and night sweats well managed with HRT. Frequent PMB.  Last Pelvic US was in 2020. No pelvic pain.  Pap smear/HPV 12/2017 Neg.  Breasts normal. Mammogram 04/2021 Neg.  Colonoscopy 2020.  Health labs with Fam MD. BMI 28.2.  Past medical history,surgical history, family history and social history were all reviewed and documented in the EPIC chart.  Gynecologic History  OB History  Gravida Para Term Preterm AB Living  3 1 1   2 1   SAB IAB Ectopic Multiple Live Births  2       1    # Outcome Date GA Lbr Len/2nd Weight Sex Delivery Anes PTL Lv  3 SAB           2 SAB           1 Term     F Vag-Spont  N LIV     ROS: A ROS was performed and pertinent positives and negatives are included in the history.  GENERAL: No fevers or chills. HEENT: No change in vision, no earache, sore throat or sinus congestion. NECK: No pain or stiffness. CARDIOVASCULAR: No chest pain or pressure. No palpitations. PULMONARY: No shortness of breath, cough or wheeze. GASTROINTESTINAL: No abdominal pain, nausea, vomiting or diarrhea, melena or bright red blood per rectum. GENITOURINARY: No urinary frequency, urgency, hesitancy or dysuria. MUSCULOSKELETAL: No joint or muscle pain, no back pain, no recent trauma. DERMATOLOGIC: No rash, no itching, no lesions. ENDOCRINE: No polyuria, polydipsia, no heat or cold intolerance. No recent change in weight. HEMATOLOGICAL: No anemia or easy bruising or bleeding. NEUROLOGIC: No headache, seizures, numbness, tingling or weakness. PSYCHIATRIC: No depression, no loss of interest in normal activity or change in sleep pattern.     Exam:   BP 110/78   Pulse 90   Resp 16   Ht 5' 3.75" (1.619 m)   Wt 163 lb (73.9 kg)   LMP 04/12/2012 Comment: having spotting  BMI 28.20 kg/m   Body mass index is 28.2  kg/m.  General appearance : Well developed well nourished female. No acute distress HEENT: Eyes: no retinal hemorrhage or exudates,  Neck supple, trachea midline, no carotid bruits, no thyroidmegaly Lungs: Clear to auscultation, no rhonchi or wheezes, or rib retractions  Heart: Regular rate and rhythm, no murmurs or gallops Breast:Examined in sitting and supine position were symmetrical in appearance, no palpable masses or tenderness,  no skin retraction, no nipple inversion, no nipple discharge, no skin discoloration, no axillary or supraclavicular lymphadenopathy Abdomen: no palpable masses or tenderness, no rebound or guarding Extremities: no edema or skin discoloration or tenderness  Pelvic: Vulva: Normal             Vagina: No gross lesions or discharge  Cervix: No gross lesions or discharge  Dark blood present at [place].  Pap reflex done.  Uterus  AV, normal size, shape and consistency, non-tender and mobile  Adnexa  Without masses or tenderness  Anus: Normal   Assessment/Plan:  60 y.o. female for annual exam   1. Encounter for routine gynecological examination with Papanicolaou smear of cervix Normal gynecologic exam except for blood from the external os and in the vagina.  Pap reflex done.  Breast exam normal.  Screening mammogram September 2022 was negative.  Colonoscopy 2020.  Body mass index 28.2.  Recommend mild decrease in calories and carbs.  Fitness activities 5 times a week and light weightlifting every 2 days. - Cytology - PAP( [facility])  2. Postmenopausal hormone replacement therapy Well on the hormone replacement therapy for about 8 years.  Longstanding frequent postmenopausal bleeding.  We will repeat a pelvic ultrasound to investigate.  Decreased estradiol to 0.05 patch twice a week.  Will change progesterone to 200 mg day 1-12th of every month.  Both prescriptions sent to pharmacy.  3. Postmenopausal bleeding Hormone replacement therapy changed.  Follow-up  pelvic ultrasound to evaluate the endometrial lining. - US Transvaginal Non-OB; Future  Other orders - FLUoxetine (PROZAC) 10 MG capsule; Take 10 mg by mouth daily. - mometasone (NASONEX) 50 MCG/ACT nasal spray; Place 2 sprays into the nose daily. - estradiol (VIVELLE-DOT) 0.05 MG/24HR patch; Place 1 patch (0.05 mg total) onto the skin 2 (two) times a week. - progesterone (PROMETRIUM) 200 MG capsule; Take 1 capsule (200 mg total) by mouth at bedtime for 12 days. 1 caps PO HS x 12 days every month.   [name] MD, 3:13 PM 05/27/2021

## 2021-05-31 LAB — CYTOLOGY - PAP: Diagnosis: NEGATIVE

## 2021-06-16 ENCOUNTER — Other Ambulatory Visit: Payer: PRIVATE HEALTH INSURANCE | Admitting: Obstetrics & Gynecology

## 2021-06-16 ENCOUNTER — Other Ambulatory Visit: Payer: PRIVATE HEALTH INSURANCE

## 2021-06-26 ENCOUNTER — Ambulatory Visit
Admission: RE | Admit: 2021-06-26 | Discharge: 2021-06-26 | Disposition: A | Discharge status: Home / Self Care | Payer: 59 | Source: Ambulatory Visit | Attending: Physician Assistant | Admitting: Physician Assistant

## 2021-06-26 ENCOUNTER — Other Ambulatory Visit: Payer: Self-pay

## 2021-06-26 VITALS — BP 119/84 | HR 92 | Temp 97.9°F | Resp 14 | Ht 63.0 in | Wt 160.0 lb

## 2021-06-26 DIAGNOSIS — R059 Cough, unspecified: Secondary | ICD-10-CM | POA: Diagnosis present

## 2021-06-26 DIAGNOSIS — J069 Acute upper respiratory infection, unspecified: Secondary | ICD-10-CM | POA: Diagnosis not present

## 2021-06-26 DIAGNOSIS — J029 Acute pharyngitis, unspecified: Secondary | ICD-10-CM | POA: Diagnosis not present

## 2021-06-26 DIAGNOSIS — Z20822 Contact with and (suspected) exposure to covid-19: Secondary | ICD-10-CM | POA: Insufficient documentation

## 2021-06-26 LAB — RESP PANEL BY RT-PCR (FLU A&B, COVID) ARPGX2
Influenza A by PCR: NEGATIVE
Influenza B by PCR: NEGATIVE
SARS Coronavirus 2 by RT PCR: NEGATIVE

## 2021-06-26 MED ORDER — BENZONATATE 200 MG PO CAPS: 200.0000 mg | ORAL_CAPSULE | Freq: Three times a day (TID) | ORAL | 0 refills | Status: DC | PRN

## 2021-06-26 NOTE — ED Triage Notes (Signed)
Patient c/o headache, cough and chest congestion that started on Friday.  Patient denies fevers.

## 2021-06-26 NOTE — ED Provider Notes (Signed)
[facility]    CSN: [id number] Arrival date & time: 06/26/21  1044      History   Chief Complaint Chief Complaint  Patient presents with   Appointment   Headache   Cough    HPI Monai L Jagiello is a 60 y.o. female who presents with HA, cough, chest congestion x 2 days. She denies having a fever. Her asthma has been acting up and did one neb treatment yesterday am since it give her the gitter's. He appetite has been down. The cough is keeping her up at night. The cough is non productive. She is mostly here to make sure she does not have something contagious she could give her grand kids and she can get her Xolair injection tomorrow.     Past Medical History:  Diagnosis Date   Anxiety    Asthma    allergy induced asthma   Depression    Dysphagia    Environmental allergies    Hernia of abdominal wall    Herpes labialis    History of hiatal hernia    Lumbar herniated disc    Panic attacks    Urinary incontinence    Urticaria     Patient Active Problem List   Diagnosis Date Noted   Esophageal dysphagia    Hiatal hernia    Anxiety 11/08/2018   Seasonal and perennial allergic rhinitis 02/26/2018   Displacement of lumbar intervertebral disc without myelopathy 12/28/2016   Low back pain 12/28/2016   Ventral incisional hernia without gangrene    Viral pharyngitis 05/05/2016   Sore throat 05/04/2016   Strep throat 04/19/2016   Chronic urticaria 01/17/2016   Acute cholecystitis 11/10/2015   Cholecystitis    History of colon polyps 10/15/2015   Chronic anxiety 10/05/2015   Diaphragmatic hernia 10/05/2015   Insomnia 10/05/2015   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 10/05/2015   Prinzmetal angina ([place]) 10/05/2015   Recurrent major depressive disorder ([place]) 10/05/2015   Chronic idiopathic urticaria 07/12/2015   Mild intermittent asthma without complication 94/76/5465   Detrusor muscle hypertonia 06/02/2015   Vitamin D deficiency 06/02/2015   Mild  persistent asthma 05/17/2015   Other allergic rhinitis 05/17/2015   Idiopathic urticaria 05/17/2015   GERD (gastroesophageal reflux disease) 05/17/2015   Gastroesophageal reflux disease without esophagitis 05/17/2015    Past Surgical History:  Procedure Laterality Date   ABDOMINAL SURGERY  2001   MYOMECTOMY    BLADDER SUSPENSION  2003   [place] transobturator pubovaginal sling Dr Gaynelle Arabian   BREAST BIOPSY Right 12/21/2010   [place]   CHOLECYSTECTOMY N/A 11/10/2015   Procedure: LAPAROSCOPIC CHOLECYSTECTOMY;  Surgeon: Florene Glen, MD;  Location: [facility];  Service: General;  Laterality: N/A;   COLONOSCOPY  2015   Dr Dorrene German [place]   COLONOSCOPY WITH PROPOFOL N/A 05/30/2019   Procedure: COLONOSCOPY WITH PROPOFOL;  Surgeon: Virgel Manifold, MD;  Location: [facility];  Service: Endoscopy;  Laterality: N/A;   DILATION AND CURETTAGE OF UTERUS     ESOPHAGOGASTRODUODENOSCOPY (EGD) WITH PROPOFOL N/A 01/23/2019   Procedure: ESOPHAGOGASTRODUODENOSCOPY (EGD) WITH PROPOFOL;  Surgeon: Virgel Manifold, MD;  Location: [facility];  Service: Endoscopy;  Laterality: N/A;   HYSTEROSCOPY     D&C, HYST X 3   VENTRAL HERNIA REPAIR N/A 12/26/2016   Procedure: HERNIA REPAIR VENTRAL ADULT;  Surgeon: Florene Glen, MD;  Location: [facility];  Service: General;  Laterality: N/A;    OB History     Gravida  3  Para  1   Term  1   Preterm      AB  2   Living  1      SAB  2   IAB      Ectopic      Multiple      Live Births  1            Home Medications    Prior to Admission medications   Medication Sig Start Date End Date Taking? Authorizing Provider  albuterol (VENTOLIN HFA) 108 (90 Base) MCG/ACT inhaler INHALE 2 PUFFS BY MOUTH EVERY 4 HOURS AS NEEDED FOR WHEEZING OR COUGHING SPELLS 01/31/21   Valentina Shaggy, MD  ALPRAZolam Duanne Moron) 1 MG tablet Take 0.5-1 mg by mouth 3 (three) times daily as needed for anxiety.     [provider]  azelastine  (OPTIVAR) 0.05 % ophthalmic solution Place 1 drop into both eyes 2 (two) times daily as needed (for itchy eyes). 06/03/20   Valentina Shaggy, MD  cetirizine (ZYRTEC) 10 MG tablet Take 10 mg by mouth daily.    [provider]  Cholecalciferol (VITAMIN D3) 3000 units TABS Take 3,000 Units by mouth daily.    [provider]  estradiol (VIVELLE-DOT) 0.05 MG/24HR patch Place 1 patch (0.05 mg total) onto the skin 2 (two) times a week. 05/30/21   [name], MD  famotidine (PEPCID) 20 MG tablet Take 1 tablet (20 mg total) by mouth 2 (two) times daily. 06/03/20   Valentina Shaggy, MD  fexofenadine (ALLEGRA) 180 MG tablet Take 1 tablet (180 mg total) by mouth daily. 06/03/20   Valentina Shaggy, MD  FLUoxetine (PROZAC) 10 MG capsule Take 10 mg by mouth daily. 02/16/21   [provider]  hydrOXYzine (ATARAX/VISTARIL) 25 MG tablet Take by mouth. 02/20/20   [provider]  ipratropium-albuterol (DUONEB) 0.5-2.5 (3) MG/3ML SOLN Take 3 mLs by nebulization every 6 (six) hours as needed. 01/25/21   Valentina Shaggy, MD  mometasone (NASONEX) 50 MCG/ACT nasal spray Place 2 sprays into the nose daily.    [provider]  progesterone (PROMETRIUM) 200 MG capsule Take 1 capsule (200 mg total) by mouth at bedtime for 12 days. 1 caps PO HS x 12 days every month. 05/27/21 06/08/21  [name], MD  medroxyPROGESTERone (PROVERA) 2.5 MG tablet Take 1 tablet (2.5 mg total) by mouth daily. 03/25/19 11/20/19  Fontaine, Belinda Block, MD    Family History Family History  Problem Relation Age of Onset   Cancer Maternal Uncle        2 MATERNAL UNCLES W COLON CA   Heart disease Maternal Grandmother    Hypertension Maternal Grandmother    Cancer Maternal Grandfather        Lung   Cancer Paternal Grandfather        Lung   Myasthenia gravis Father    Alzheimer's disease Father    Healthy Mother    Allergic rhinitis Neg Hx    Angioedema Neg Hx    Asthma  Neg Hx    Immunodeficiency Neg Hx    Eczema Neg Hx    Urticaria Neg Hx     Social History Social History   Tobacco Use   Smoking status: Former    Packs/day: 1.00    Types: Cigarettes    Quit date: 08/13/2012    Years since quitting: 8.8   Smokeless tobacco: Never  Vaping Use   Vaping Use: Never used  Substance Use Topics  Alcohol use: Yes    Comment: Wine daily   Drug use: No     Allergies   Latex, Clonazepam, Escitalopram, Other, Codeine, and Montelukast   Review of Systems Review of Systems  Constitutional:  Positive for appetite change. Negative for chills, diaphoresis, fatigue and fever.  HENT:  Positive for congestion, postnasal drip and rhinorrhea. Negative for ear discharge, ear pain, sore throat and trouble swallowing.   Eyes:  Negative for discharge.  Respiratory:  Positive for cough.   Musculoskeletal:  Negative for myalgias.  Neurological:  Positive for headaches.    Physical Exam Triage Vital Signs ED Triage Vitals  Enc Vitals Group     BP 06/26/21 1053 119/84     Pulse Rate 06/26/21 1053 92     Resp 06/26/21 1053 14     Temp 06/26/21 1053 97.9 F (36.6 C)     Temp Source 06/26/21 1053 Oral     SpO2 06/26/21 1053 100 %     Weight 06/26/21 1049 160 lb (72.6 kg)     Height 06/26/21 1049 5\' 3"  (1.6 m)     Head Circumference --      Peak Flow --      Pain Score 06/26/21 1049 4     Pain Loc --      Pain Edu? --      Excl. in [place]? --    No data found.  Updated Vital Signs BP 119/84 (BP Location: Right Arm)   Pulse 92   Temp 97.9 F (36.6 C) (Oral)   Resp 14   Ht 5\' 3"  (1.6 m)   Wt 160 lb (72.6 kg)   LMP 04/12/2012 Comment: having spotting  SpO2 100%   BMI 28.34 kg/m   Visual Acuity Right Eye Distance:   Left Eye Distance:   Bilateral Distance:    Right Eye Near:   Left Eye Near:    Bilateral Near:     Physical Exam Physical Exam Vitals signs and nursing note reviewed.  Constitutional:      General: She is not in acute  distress.    Appearance: Normal appearance. She is not ill-appearing, toxic-appearing or diaphoretic.  HENT:     Head: Normocephalic.     Right Ear: Tympanic membrane, ear canal and external ear normal.     Left Ear: Tympanic membrane, ear canal and external ear normal.     Nose: Nose normal.     Mouth/Throat: clear    Mouth: Mucous membranes are moist.  Eyes:     General: No scleral icterus.       Right eye: No discharge.        Left eye: No discharge.     Conjunctiva/sclera: Conjunctivae normal.  Neck:     Musculoskeletal: Neck supple. No neck rigidity.  Cardiovascular:     Rate and Rhythm: Normal rate and regular rhythm.     Heart sounds: No murmur.  Pulmonary:     Effort: Pulmonary effort is normal.     Breath sounds: Normal breath sounds.   Musculoskeletal: Normal range of motion.  Lymphadenopathy:     Cervical: No cervical adenopathy.  Skin:    General: Skin is warm and dry.     Coloration: Skin is not jaundiced.     Findings: No rash.  Neurological:     Mental Status: She is alert and oriented to person, place, and time.     Gait: Gait normal.  Psychiatric:        Mood  and Affect: Mood normal.        Behavior: Behavior normal.        Thought Content: Thought content normal.        Judgment: Judgment normal.    UC Treatments / Results  Labs (all labs ordered are listed, but only abnormal results are displayed) Labs Reviewed  RESP PANEL BY RT-PCR (FLU A&B, COVID) [id number]    EKG   Radiology No results found.  Procedures Procedures (including critical care time)  Medications Ordered in UC Medications - No data to display  Initial Impression / Assessment and Plan / UC Course  I have reviewed the triage vital signs and the nursing notes. Pertinent labs  results that were available during my care of the patient were reviewed by me and considered in my medical decision making (see chart for details). URI She declined Tessalon or Tussionex for cough.  Tessalon has not helped in the past. Will try Delsym.     Final Clinical Impressions(s) / UC Diagnoses   Final diagnoses:  Upper respiratory tract infection, unspecified type     Discharge Instructions      Your flu and Covid test are negative. Use your inhaler as needed for cough and wheezing.      ED Prescriptions     Medication Sig Dispense Auth. Provider   benzonatate (TESSALON) 200 MG capsule  (Status: Discontinued) Take 1 capsule (200 mg total) by mouth 3 (three) times daily as needed for cough. 30 capsule Rodriguez-Southworth, Sunday Spillers, PA-C      PDMP not reviewed this encounter.   Shelby Mattocks, PA-C 06/26/21 1148

## 2021-06-26 NOTE — Discharge Instructions (Signed)
Your flu and Covid test are negative. Use your inhaler as needed for cough and wheezing.

## 2021-06-27 ENCOUNTER — Ambulatory Visit (INDEPENDENT_AMBULATORY_CARE_PROVIDER_SITE_OTHER): Payer: 59

## 2021-06-27 DIAGNOSIS — L501 Idiopathic urticaria: Secondary | ICD-10-CM | POA: Diagnosis not present

## 2021-07-19 ENCOUNTER — Encounter: Payer: Self-pay | Admitting: Allergy & Immunology

## 2021-07-19 ENCOUNTER — Other Ambulatory Visit: Payer: Self-pay | Admitting: Allergy & Immunology

## 2021-07-19 ENCOUNTER — Other Ambulatory Visit: Payer: Self-pay

## 2021-07-19 MED ORDER — ALBUTEROL SULFATE HFA 108 (90 BASE) MCG/ACT IN AERS: INHALATION_SPRAY | RESPIRATORY_TRACT | 1 refills | Status: DC

## 2021-07-22 ENCOUNTER — Other Ambulatory Visit: Payer: Self-pay | Admitting: *Deleted

## 2021-07-22 MED ORDER — VENTOLIN HFA 108 (90 BASE) MCG/ACT IN AERS: 2.0000 | INHALATION_SPRAY | RESPIRATORY_TRACT | 1 refills | Status: DC | PRN

## 2021-07-25 ENCOUNTER — Ambulatory Visit: Payer: 59

## 2021-07-26 ENCOUNTER — Telehealth: Payer: Self-pay | Admitting: *Deleted

## 2021-07-26 ENCOUNTER — Encounter: Payer: Self-pay | Admitting: Obstetrics & Gynecology

## 2021-07-26 ENCOUNTER — Ambulatory Visit (INDEPENDENT_AMBULATORY_CARE_PROVIDER_SITE_OTHER): Payer: 59 | Admitting: *Deleted

## 2021-07-26 ENCOUNTER — Ambulatory Visit (INDEPENDENT_AMBULATORY_CARE_PROVIDER_SITE_OTHER): Payer: 59 | Admitting: Obstetrics & Gynecology

## 2021-07-26 ENCOUNTER — Ambulatory Visit (INDEPENDENT_AMBULATORY_CARE_PROVIDER_SITE_OTHER): Payer: 59

## 2021-07-26 ENCOUNTER — Other Ambulatory Visit: Payer: Self-pay

## 2021-07-26 VITALS — BP 118/74

## 2021-07-26 DIAGNOSIS — Z79890 Hormone replacement therapy (postmenopausal): Secondary | ICD-10-CM

## 2021-07-26 DIAGNOSIS — N950 Postmenopausal bleeding: Secondary | ICD-10-CM | POA: Diagnosis not present

## 2021-07-26 DIAGNOSIS — L501 Idiopathic urticaria: Secondary | ICD-10-CM | POA: Diagnosis not present

## 2021-07-26 MED ORDER — PROGESTERONE MICRONIZED 100 MG PO CAPS: 100.0000 mg | ORAL_CAPSULE | Freq: Every day | ORAL | 4 refills | Status: DC

## 2021-07-26 NOTE — Telephone Encounter (Signed)
I think insurance should be fine with it. I am cool with it.   Salvatore Marvel, MD [facility]

## 2021-07-26 NOTE — Progress Notes (Signed)
° ° °  Hannah [name] 09/29/1960 [id number]        60 y.o.  G3P1A2L1   RP: PMB for Pelvic US  HPI: Frequent PMB until Estradiol was increased at 05/2021 visit.  No PMB since then.  No pelvic pain.   OB History  Gravida Para Term Preterm AB Living  3 1 1   2 1   SAB IAB Ectopic Multiple Live Births  2       1    # Outcome Date GA Lbr Len/2nd Weight Sex Delivery Anes PTL Lv  3 SAB           2 SAB           1 Term     F Vag-Spont  N LIV    Past medical history,surgical history, problem list, medications, allergies, family history and social history were all reviewed and documented in the EPIC chart.  Directed ROS with pertinent positives and negatives documented in the history of present illness/assessment and plan.  Exam:  Vitals:   07/26/21 1544  BP: 118/74   General appearance:  Normal  Pelvic US today: T/V images.  Anteverted uterus enlarged with normal shape measured at 11.19 x 6.78 x 5.82 cm.  2 fundal subserous fibroids noted the largest measuring 1.9 x 2.4 cm.  Thin symmetrical endometrial lining measured at 3.8 mm with no obvious mass or thickening seen.  Focal area in the lower uterine segment of the cavity noted with increased vascularity.  Right ovary is atrophic in appearance within normal limits.  Left ovary is not visualized vaginally or abdominally.  No adnexal mass.  Trace amount of free fluid in the right adnexa.   Assessment/Plan:  60 y.o. G3P1021   1. Postmenopausal bleeding Frequent PMB until Estradiol was increased at 05/2021 visit.  No PMB since then.  No pelvic pain.  Pelvic ultrasound findings reviewed with Hannah [name] currently.  Endometrial lining is thin at 3.8 mm with no obvious mass or thickening seen.  Patient informed of the focal area in the lower uterine segment with increased vascularity.  Will call back for further investigation if postmenopausal bleeding recurs.  2. Postmenopausal hormone replacement therapy Well on Estradiol patch 0.05 twice a  week with Prometrium 100 mg PO HS.  Other orders - fluticasone (FLONASE) 50 MCG/ACT nasal spray; Place 1 spray into both nostrils 2 (two) times daily. - omalizumab (XOLAIR) 150 MG injection; See admin instructions. - EPINEPHrine 0.3 mg/0.3 mL IJ SOAJ injection; Inject 0.3 mg into the muscle as needed for anaphylaxis. (Patient not taking: Reported on 07/26/2021) - progesterone (PROMETRIUM) 100 MG capsule; Take 1 capsule (100 mg total) by mouth at bedtime.  - Estradiol patch 0.05 twice a week  Hannah Bruins MD, 3:49 PM 07/26/2021

## 2021-07-26 NOTE — Telephone Encounter (Signed)
Patient receives 300mg  Xolair prefilled syringes and expressed interest in wanting to have her husband administer her injections at home. She plans to bring him in for the next injection so he can administer. Is it ok for the patient to self administer the Xolair at home? Just wanted to be sure for insurance purposes and everything.

## 2021-07-29 ENCOUNTER — Other Ambulatory Visit: Payer: Self-pay | Admitting: *Deleted

## 2021-07-29 MED ORDER — VENTOLIN HFA 108 (90 BASE) MCG/ACT IN AERS
2.0000 | INHALATION_SPRAY | RESPIRATORY_TRACT | 1 refills | Status: DC | PRN
Start: 2021-07-29 — End: 2022-02-09

## 2021-07-31 ENCOUNTER — Encounter: Payer: Self-pay | Admitting: Obstetrics & Gynecology

## 2021-08-03 NOTE — Telephone Encounter (Signed)
Called [employer] to arrange patient to get home delivery and advised patient to reorder one week prior to injections with storage and dosing, patient husband will be coming with her to learn how to admin

## 2021-08-23 ENCOUNTER — Ambulatory Visit: Payer: 59 | Admitting: *Deleted

## 2021-08-23 ENCOUNTER — Other Ambulatory Visit: Payer: Self-pay

## 2021-08-23 ENCOUNTER — Other Ambulatory Visit: Payer: Self-pay | Admitting: Allergy & Immunology

## 2021-08-23 DIAGNOSIS — L501 Idiopathic urticaria: Secondary | ICD-10-CM

## 2021-08-23 NOTE — Progress Notes (Signed)
Immunotherapy   Patient Details  Name: Hannah [name] MRN: [id number] Date of Birth: 1961/05/06  08/23/2021  [name]   Patients husband administered her Xolair into the [facility] and [place]. Patient left without waiting. Her husband will continue to administer her Xolair 300mg  at home every 4 weeks.    Tykera Skates Fernandez-[place] 08/23/2021, 2:38 PM

## 2021-11-15 ENCOUNTER — Other Ambulatory Visit: Payer: Self-pay | Admitting: *Deleted

## 2021-11-15 MED ORDER — ACYCLOVIR 200 MG/5ML PO SUSP: 200.0000 mg | Freq: Every day | ORAL | 0 refills | Status: AC

## 2021-11-15 NOTE — Telephone Encounter (Signed)
Last annual exam was 05/2021 

## 2021-11-16 DIAGNOSIS — L814 Other melanin hyperpigmentation: Secondary | ICD-10-CM | POA: Insufficient documentation

## 2021-11-16 DIAGNOSIS — Z87898 Personal history of other specified conditions: Secondary | ICD-10-CM | POA: Insufficient documentation

## 2021-11-16 DIAGNOSIS — D225 Melanocytic nevi of trunk: Secondary | ICD-10-CM | POA: Insufficient documentation

## 2021-11-16 DIAGNOSIS — L989 Disorder of the skin and subcutaneous tissue, unspecified: Secondary | ICD-10-CM | POA: Insufficient documentation

## 2021-11-16 DIAGNOSIS — M5136 Other intervertebral disc degeneration, lumbar region: Secondary | ICD-10-CM | POA: Insufficient documentation

## 2021-11-16 DIAGNOSIS — M51369 Other intervertebral disc degeneration, lumbar region without mention of lumbar back pain or lower extremity pain: Secondary | ICD-10-CM | POA: Insufficient documentation

## 2021-11-18 DIAGNOSIS — B009 Herpesviral infection, unspecified: Secondary | ICD-10-CM | POA: Insufficient documentation

## 2022-01-26 ENCOUNTER — Ambulatory Visit: Payer: PRIVATE HEALTH INSURANCE | Admitting: Allergy & Immunology

## 2022-02-09 ENCOUNTER — Ambulatory Visit: Payer: 59 | Admitting: Allergy & Immunology

## 2022-02-09 ENCOUNTER — Encounter: Payer: Self-pay | Admitting: Allergy & Immunology

## 2022-02-09 VITALS — BP 122/76 | HR 71 | Temp 98.6°F | Resp 16 | Ht 63.0 in | Wt 171.2 lb

## 2022-02-09 DIAGNOSIS — J4520 Mild intermittent asthma, uncomplicated: Secondary | ICD-10-CM

## 2022-02-09 DIAGNOSIS — K219 Gastro-esophageal reflux disease without esophagitis: Secondary | ICD-10-CM | POA: Diagnosis not present

## 2022-02-09 DIAGNOSIS — J3089 Other allergic rhinitis: Secondary | ICD-10-CM | POA: Diagnosis not present

## 2022-02-09 DIAGNOSIS — J302 Other seasonal allergic rhinitis: Secondary | ICD-10-CM

## 2022-02-09 DIAGNOSIS — L501 Idiopathic urticaria: Secondary | ICD-10-CM | POA: Diagnosis not present

## 2022-02-09 DIAGNOSIS — T63481D Toxic effect of venom of other arthropod, accidental (unintentional), subsequent encounter: Secondary | ICD-10-CM

## 2022-02-09 MED ORDER — EPINEPHRINE 0.3 MG/0.3ML IJ SOAJ: 0.3000 mg | INTRAMUSCULAR | 2 refills | Status: DC | PRN

## 2022-02-09 MED ORDER — VENTOLIN HFA 108 (90 BASE) MCG/ACT IN AERS
2.0000 | INHALATION_SPRAY | RESPIRATORY_TRACT | 1 refills | Status: DC | PRN
Start: 2022-02-09 — End: 2023-04-30

## 2022-02-09 MED ORDER — IPRATROPIUM-ALBUTEROL 0.5-2.5 (3) MG/3ML IN SOLN: 3.0000 mL | Freq: Four times a day (QID) | RESPIRATORY_TRACT | 3 refills | Status: DC | PRN

## 2022-02-09 NOTE — Progress Notes (Signed)
FOLLOW UP  Date of Service/Encounter:  02/09/22   Assessment:   Chronic idiopathic urticaria - needing 4 or 5 courses of prednisone annually for control   Gastroesophageal reflux disease   Allergic reaction to insect sting   Mild intermittent asthma without complication   Seasonal and perennial allergic rhinitis  Plan/Recommendations:    1. Chronic idiopathic urticaria - Continue with Xolair every 28 days (try spacing to every five weeks for a few doses and let us know how it goes).  - Continue with Allegra in the morning and Zyrtec at night.  - This is no big deal to stay on these antihistamines.   2. Allergic reaction to insect sting - EpiPen is up to date. - Continue to avoid stinging insects as tolerated.  4. Mild intermittent asthma without complication - Lung testing looks awesome. - Continue with albuterol as needed.  - Continue with DuoNeb as needed.  - You seem to have a good handle on your symptoms.   5. Seasonal and perennial allergic rhinitis - Continue with the allergy medications, as above.  6. Return in about 1 year (around 02/10/2023).    Subjective:   Hannah [name] is a 61 y.o. female presenting today for follow up of  Chief Complaint  Patient presents with   Follow-up    Hannah [name] has a history of the following: Patient Active Problem List   Diagnosis Date Noted   Esophageal dysphagia    Hiatal hernia    Anxiety 11/08/2018   Seasonal and perennial allergic rhinitis 02/26/2018   Displacement of lumbar intervertebral disc without myelopathy 12/28/2016   Low back pain 12/28/2016   Ventral incisional hernia without gangrene    Viral pharyngitis 05/05/2016   Sore throat 05/04/2016   Strep throat 04/19/2016   Chronic urticaria 01/17/2016   Acute cholecystitis 11/10/2015   Cholecystitis    History of colon polyps 10/15/2015   Chronic anxiety 10/05/2015   Diaphragmatic hernia 10/05/2015   Insomnia 10/05/2015   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  Hannah [name] is a 61 y.o. female presenting for a follow up visit.  She was last seen in June 2022.  At that time, we continue with Xolair every 28 weeks.  We stopped her Pepcid and continue with Allegra in the morning and Zyrtec at night.  She continued to avoid stinging insects.  EpiPen is up-to-date.  Asthma was controlled with Ventolin as needed.  For her allergic rhinitis, her allergy medications were helping with this as well as the hives.  Since last visit, she has done well.   Asthma/Respiratory Symptom History: Asthma is under good control with the current regimen. She has had some issues when she used her DuoNeb. She got sick in November 2022 and this caused some breathing issues. [place] and her breathing was a problem during the traveling.  She has not needed prednisone at all since the last visit and she has not been to the emergency room for her  symptoms.   Allergic Rhinitis Symptom History: Environmental allergies are well controlled with the antihistamines. 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   Skin Symptom History: She has had some breakthrough urticarial outbreaks. Her husband is giving the shots now at home and this has been relatively seamless. She had her husband do some training with [place] at the  office. She has to add on famotidine 1-2 times in the last 6 months. This is nothing like it was previously. It is no longer as awful as it used to be.  She is doing Allegra in the morning and Zyrtec at night.   Work has remained busy. She has continued to telework so that has been very helpful.   Otherwise, there have been no changes to her past medical history, surgical history, family history, or social history.    Review of Systems  Constitutional: Negative.  Negative for chills, fever, malaise/fatigue and weight loss.  HENT: Negative.  Negative for congestion, ear discharge, ear pain and sinus pain.   Eyes:  Negative for pain, discharge and redness.  Respiratory:  Negative for cough, sputum production, shortness of breath and wheezing.   Cardiovascular: Negative.  Negative for chest pain and palpitations.  Gastrointestinal:  Negative for abdominal pain, constipation, diarrhea, heartburn, nausea and vomiting.  Skin: Negative.  Negative for itching and rash.  Neurological:  Negative for dizziness and headaches.  Endo/Heme/Allergies:  Negative for environmental allergies. Does not bruise/bleed easily.       Objective:   Blood pressure 122/76, pulse 71, temperature 98.6 F (37 C), resp. rate 16, height '5\' 3"'$  (1.6 m), weight 171 lb 4 oz (77.7 kg), last menstrual period 04/12/2012, SpO2 95 %. Body mass index is 30.34 kg/m.    Physical Exam Constitutional:      Appearance: She is well-developed.     Comments: Very thankful.  HENT:     Head: Normocephalic and atraumatic.     Right Ear: Tympanic membrane, ear canal and external ear normal.     Left Ear: Tympanic membrane, ear canal and external ear normal.     Nose: No nasal deformity, septal deviation, mucosal edema or rhinorrhea.     Right Turbinates: Not enlarged or swollen.     Left Turbinates: Not enlarged or swollen.     Right Sinus: No maxillary sinus tenderness or frontal sinus tenderness.     Left Sinus: No maxillary  sinus tenderness or frontal sinus tenderness.     Mouth/Throat:     Mouth: Mucous membranes are not pale and not dry.     Pharynx: Uvula midline.  Eyes:     General: Lids are normal. No allergic shiner.       Right eye: No discharge.        Left eye: No discharge.     Conjunctiva/sclera: Conjunctivae normal.     Right eye: Right conjunctiva is not injected. No chemosis.    Left eye: Left conjunctiva is not injected. No chemosis.    Pupils: Pupils are equal, round, and reactive to light.  Cardiovascular:     Rate and Rhythm: Normal rate and regular rhythm.     Heart sounds: Normal heart sounds.  Pulmonary:     Effort: Pulmonary effort is normal. No tachypnea, accessory muscle usage or respiratory distress.     Breath sounds: Normal breath sounds. No wheezing, rhonchi or rales.  Comments: Moving air well in all lung fields.   Chest:     Chest wall: No tenderness.  Lymphadenopathy:     Cervical: No cervical adenopathy.  Skin:    General: Skin is warm.     Capillary Refill: Capillary refill takes less than 2 seconds.     Coloration: Skin is not pale.     Findings: No abrasion, erythema, petechiae or rash. Rash is not papular, urticarial or vesicular.     Comments: No eczematous or urticarial lesions noted.   Neurological:     Mental Status: She is alert.  Psychiatric:        Behavior: Behavior is cooperative.      Diagnostic studies:    Spirometry: results normal (FEV1: 2.29/90%, FVC: 2.89/90%, FEV1/FVC: 79%).    Spirometry consistent with normal pattern.    Allergy Studies: none        Salvatore Marvel, MD  [facility]

## 2022-02-09 NOTE — Patient Instructions (Addendum)
1. Chronic idiopathic urticaria - Continue with Xolair every 28 days (try spacing to every five weeks for a few doses and let us know how it goes).  - Continue with Allegra in the morning and Zyrtec at night.  - This is no big deal to stay on these antihistamines.   2. Allergic reaction to insect sting - EpiPen is up to date. - Continue to avoid stinging insects as tolerated.  4. Mild intermittent asthma without complication - Lung testing looks awesome. - Continue with albuterol as needed.  - Continue with DuoNeb as needed.  - You seem to have a good handle on your symptoms.   5. Seasonal and perennial allergic rhinitis - Continue with the allergy medications, as above.  6. Return in about 1 year (around 02/10/2023).    Please inform us of any Emergency Department visits, hospitalizations, or changes in symptoms. Call us before going to the ED for breathing or allergy symptoms since we might be able to fit you in for a sick visit. Feel free to contact us anytime with any questions, problems, or concerns.  It was a pleasure to see you again today!  Websites that have reliable patient information: 1. American Academy of Asthma, Allergy, and Immunology: www.aaaai.org 2. Food Allergy Research and Education (FARE): foodallergy.org 3. Mothers of Asthmatics: http://www.asthmacommunitynetwork.org 4. American College of Allergy, Asthma, and Immunology: www.acaai.org   COVID-19 Vaccine Information can be found at: ShippingScam.co.uk For questions related to vaccine distribution or appointments, please email [email or web address] or call [phone].   We realize that you might be concerned about having an allergic reaction to the COVID19 vaccines. To help with that concern, WE ARE OFFERING THE COVID19 VACCINES IN OUR OFFICE! Ask the front desk for dates!     "Like" [place] on Facebook and Instagram for our latest updates!      A  healthy democracy works best when [employer] participate! Make sure you are registered to vote! If you have moved or changed any of your contact information, you will need to get this updated before voting!  In some cases, you MAY be able to register to vote online: [email or web address]

## 2022-02-11 ENCOUNTER — Encounter: Payer: Self-pay | Admitting: Allergy & Immunology

## 2022-03-27 ENCOUNTER — Other Ambulatory Visit: Payer: Self-pay | Admitting: Obstetrics & Gynecology

## 2022-03-27 DIAGNOSIS — Z1231 Encounter for screening mammogram for malignant neoplasm of breast: Secondary | ICD-10-CM

## 2022-04-18 ENCOUNTER — Ambulatory Visit
Admission: RE | Admit: 2022-04-18 | Discharge: 2022-04-18 | Disposition: A | Discharge status: Home / Self Care | Payer: PRIVATE HEALTH INSURANCE | Source: Ambulatory Visit | Attending: Obstetrics & Gynecology | Admitting: Obstetrics & Gynecology

## 2022-04-18 DIAGNOSIS — Z1231 Encounter for screening mammogram for malignant neoplasm of breast: Secondary | ICD-10-CM

## 2022-05-17 ENCOUNTER — Encounter: Payer: Self-pay | Admitting: Allergy & Immunology

## 2022-05-18 ENCOUNTER — Other Ambulatory Visit: Payer: Self-pay | Admitting: *Deleted

## 2022-05-18 MED ORDER — OMALIZUMAB 150 MG/ML ~~LOC~~ SOSY: PREFILLED_SYRINGE | SUBCUTANEOUS | 11 refills | Status: DC

## 2022-05-18 NOTE — Telephone Encounter (Signed)
Thanks for taking care of that Tammy!  Salvatore Marvel, MD [facility]

## 2022-06-18 ENCOUNTER — Encounter: Payer: Self-pay | Admitting: Allergy & Immunology

## 2022-06-19 ENCOUNTER — Other Ambulatory Visit: Payer: Self-pay | Admitting: *Deleted

## 2022-06-19 NOTE — Telephone Encounter (Signed)
Is this ok to refill for the patient? I didn't see a mention of [facility] or a recent prescription.

## 2022-06-20 ENCOUNTER — Other Ambulatory Visit: Payer: Self-pay | Admitting: *Deleted

## 2022-06-20 MED ORDER — AZELASTINE HCL 0.05 % OP SOLN: 1.0000 [drp] | Freq: Two times a day (BID) | OPHTHALMIC | 5 refills | Status: DC | PRN

## 2022-06-23 ENCOUNTER — Ambulatory Visit: Payer: 59 | Admitting: Obstetrics & Gynecology

## 2022-06-30 DIAGNOSIS — E785 Hyperlipidemia, unspecified: Secondary | ICD-10-CM | POA: Insufficient documentation

## 2022-07-14 ENCOUNTER — Ambulatory Visit (INDEPENDENT_AMBULATORY_CARE_PROVIDER_SITE_OTHER): Payer: 59 | Admitting: Obstetrics & Gynecology

## 2022-07-14 ENCOUNTER — Encounter: Payer: Self-pay | Admitting: Obstetrics & Gynecology

## 2022-07-14 VITALS — BP 122/80 | HR 81 | Ht 63.25 in | Wt 167.0 lb

## 2022-07-14 DIAGNOSIS — N952 Postmenopausal atrophic vaginitis: Secondary | ICD-10-CM

## 2022-07-14 DIAGNOSIS — Z01419 Encounter for gynecological examination (general) (routine) without abnormal findings: Secondary | ICD-10-CM

## 2022-07-14 MED ORDER — ESTRADIOL 10 MCG VA TABS: 1.0000 | ORAL_TABLET | VAGINAL | 4 refills | Status: DC

## 2022-07-14 NOTE — Progress Notes (Signed)
JENNA ROUTZAHN November 08, 1960 [id number]   History:    61 y.o.  [id number]   RP:  Established patient presenting for annual gyn exam    HPI: Postmenopausal, well on Estradiol patch 0.05 and Prometrium 100 mg HS.  Hot flashes and night sweats well controled.  No PMB x last year 05/2021.  Pelvic US showed a thin normal endometrial line at 3.8 mm on 07/26/21.  No pelvic pain.  Pap smear Neg in 05/2021.  Breasts normal. Mammogram 04/2022 Neg. Colonoscopy 05/2019.  Health labs with Fam MD. BMI 29.35. Flu vaccine done at pharmacy  Past medical history,surgical history, family history and social history were all reviewed and documented in the EPIC chart.  Gynecologic History Patient's last menstrual period was 04/12/2012.  Obstetric History OB History  Gravida Para Term Preterm AB Living  '3 1 1   2 1  '$ SAB IAB Ectopic Multiple Live Births  2       1    # Outcome Date GA Lbr Len/2nd Weight Sex Delivery Anes PTL Lv  3 SAB           2 SAB           1 Term     F Vag-Spont  N LIV     ROS: A ROS was performed and pertinent positives and negatives are included in the history. GENERAL: No fevers or chills. HEENT: No change in vision, no earache, sore throat or sinus congestion. NECK: No pain or stiffness. CARDIOVASCULAR: No chest pain or pressure. No palpitations. PULMONARY: No shortness of breath, cough or wheeze. GASTROINTESTINAL: No abdominal pain, nausea, vomiting or diarrhea, melena or bright red blood per rectum. GENITOURINARY: No urinary frequency, urgency, hesitancy or dysuria. MUSCULOSKELETAL: No joint or muscle pain, no back pain, no recent trauma. DERMATOLOGIC: No rash, no itching, no lesions. ENDOCRINE: No polyuria, polydipsia, no heat or cold intolerance. No recent change in weight. HEMATOLOGICAL: No anemia or easy bruising or bleeding. NEUROLOGIC: No headache, seizures, numbness, tingling or weakness. PSYCHIATRIC: No depression, no loss of interest in normal activity or change in sleep  pattern.     Exam:   BP 122/80   Pulse 81   Ht 5' 3.25" (1.607 m)   Wt 167 lb (75.8 kg)   LMP 04/12/2012 Comment: having spotting  SpO2 98%   BMI 29.35 kg/m   Body mass index is 29.35 kg/m.  General appearance : Well developed well nourished female. No acute distress HEENT: Eyes: no retinal hemorrhage or exudates,  Neck supple, trachea midline, no carotid bruits, no thyroidmegaly Lungs: Clear to auscultation, no rhonchi or wheezes, or rib retractions  Heart: Regular rate and rhythm, no murmurs or gallops Breast:Examined in sitting and supine position were symmetrical in appearance, no palpable masses or tenderness,  no skin retraction, no nipple inversion, no nipple discharge, no skin discoloration, no axillary or supraclavicular lymphadenopathy Abdomen: no palpable masses or tenderness, no rebound or guarding Extremities: no edema or skin discoloration or tenderness  Pelvic: Vulva: Normal             Vagina: No gross lesions or discharge  Cervix: No gross lesions or discharge  Uterus  AV, mildly increased in size, hard from fibroids, non-tender and mobile  Adnexa  Without masses or tenderness  Anus: Normal   Assessment/Plan:  61 y.o. female for annual exam   1. Well female exam with routine gynecological exam Postmenopausal, well on Estradiol patch 0.05 and Prometrium 100 mg HS.  Hot flashes  and night sweats well controled.  No PMB x last year 05/2021.  Pelvic US showed a thin normal endometrial line at 3.8 mm on 07/26/21.  No pelvic pain.  Pap smear Neg in 05/2021.  Breasts normal. Mammogram 04/2022 Neg. Colonoscopy 05/2019.  Health labs with Fam MD. BMI 29.35. Flu vaccine done at pharmacy.  2. Postmenopausal atrophic vaginitis Postmenopausal, well on Estradiol patch 0.05 and Prometrium 100 mg HS.  Hot flashes and night sweats well controled.  No PMB x last year 05/2021.  Pelvic US showed a thin normal endometrial line at 3.8 mm on 07/26/21.  No pelvic pain. Decision to stop  systemic HRT.  Start on Vagifem treatment started, usage reviewed and prescription sent to pharmacy.  Other orders - Estradiol 10 MCG TABS vaginal tablet; Place 1 tablet (10 mcg total) vaginally 2 (two) times a week. May start with 1 tab vaginally HS x 2 weeks, then long term 1 tab vaginally twice a week.   [name] MD, 11:38 AM

## 2022-07-21 ENCOUNTER — Ambulatory Visit: Payer: 59 | Admitting: Obstetrics & Gynecology

## 2022-08-12 ENCOUNTER — Other Ambulatory Visit: Payer: Self-pay | Admitting: Obstetrics & Gynecology

## 2022-08-12 DIAGNOSIS — Z79890 Hormone replacement therapy (postmenopausal): Secondary | ICD-10-CM

## 2022-08-15 ENCOUNTER — Encounter: Payer: Self-pay | Admitting: Obstetrics & Gynecology

## 2022-08-15 NOTE — Telephone Encounter (Signed)
Last AEX 07/14/2022  Last mammo 04/18/2022-WNL  Pt started on vagifem after annual.  Please advise.

## 2022-08-15 NOTE — Telephone Encounter (Signed)
Refer to [place] encounter on 08/15/2022.

## 2022-08-18 NOTE — Telephone Encounter (Signed)
Per ML: " Please specify with patient what "these prescriptions" are

## 2022-08-21 NOTE — Telephone Encounter (Signed)
Refer to Rx refills on 08/15/22. Will close this encounter.

## 2022-08-23 ENCOUNTER — Ambulatory Visit
Admission: EM | Admit: 2022-08-23 | Discharge: 2022-08-23 | Disposition: A | Discharge status: Home / Self Care | Payer: Managed Care, Other (non HMO) | Attending: Family Medicine | Admitting: Family Medicine

## 2022-08-23 ENCOUNTER — Ambulatory Visit: Payer: Managed Care, Other (non HMO)

## 2022-08-23 DIAGNOSIS — I2089 Other forms of angina pectoris: Secondary | ICD-10-CM

## 2022-08-23 DIAGNOSIS — T7840XA Allergy, unspecified, initial encounter: Secondary | ICD-10-CM | POA: Insufficient documentation

## 2022-08-23 DIAGNOSIS — R0789 Other chest pain: Secondary | ICD-10-CM

## 2022-08-23 LAB — CBC WITH DIFFERENTIAL/PLATELET
Abs Immature Granulocytes: 0.04 10*3/uL (ref 0.00–0.07)
Basophils Absolute: 0.1 10*3/uL (ref 0.0–0.1)
Basophils Relative: 1 %
Eosinophils Absolute: 0.1 10*3/uL (ref 0.0–0.5)
Eosinophils Relative: 1 %
HCT: 43 % (ref 36.0–46.0)
Hemoglobin: 14.8 g/dL (ref 12.0–15.0)
Immature Granulocytes: 0 %
Lymphocytes Relative: 15 %
Lymphs Abs: 1.4 10*3/uL (ref 0.7–4.0)
MCH: 30.4 pg (ref 26.0–34.0)
MCHC: 34.4 g/dL (ref 30.0–36.0)
MCV: 88.3 fL (ref 80.0–100.0)
Monocytes Absolute: 0.6 10*3/uL (ref 0.1–1.0)
Monocytes Relative: 6 %
Neutro Abs: 7 10*3/uL (ref 1.7–7.7)
Neutrophils Relative %: 77 %
Platelets: 434 10*3/uL — ABNORMAL HIGH (ref 150–400)
RBC: 4.87 MIL/uL (ref 3.87–5.11)
RDW: 13.2 % (ref 11.5–15.5)
WBC: 9.2 10*3/uL (ref 4.0–10.5)
nRBC: 0 % (ref 0.0–0.2)

## 2022-08-23 LAB — BASIC METABOLIC PANEL
Anion gap: 8 (ref 5–15)
BUN: 18 mg/dL (ref 6–20)
CO2: 25 mmol/L (ref 22–32)
Calcium: 8.7 mg/dL — ABNORMAL LOW (ref 8.9–10.3)
Chloride: 103 mmol/L (ref 98–111)
Creatinine, Ser: 0.71 mg/dL (ref 0.44–1.00)
GFR, Estimated: >60 mL/min (ref >60)
Glucose, Bld: 90 mg/dL (ref 70–99)
Potassium: 4.5 mmol/L (ref 3.5–5.1)
Sodium: 136 mmol/L (ref 135–145)

## 2022-08-23 LAB — TROPONIN I (HIGH SENSITIVITY): Troponin I (High Sensitivity): <2 ng/L (ref <18)

## 2022-08-23 NOTE — ED Triage Notes (Signed)
Patient presents to UC for chest pain and shoulder blade pain since today about 1 hr ago. Has not taking any medications. States she has a hx of tachycardia, angina and sees cardiology for it. Reports dealing with a lot of stress lately and also reports attending [employer].   Denies SOB or n/v.

## 2022-08-23 NOTE — ED Provider Notes (Signed)
[facility]    CSN: [id number] Arrival date & time: 08/23/22  5625      History   Chief Complaint Chief Complaint  Patient presents with   Chest Pain   Arm Pain    HPI Hannah [name] is a 62 y.o. female.   HPI  Hannah [name] here for chest pain/discomfort since around 730 AM while in a stressful meeting at work. Reports being under a lot of stress recently.  Reports tightness in her anterior chest that radiated to her left shoulder and down her left arm.  Says she had some tingling in her left arm.  She had a lot of pain on the left posterior chest wall.  Chest pain has mostly resolved. There has been no vomiting, nausea, sweating, jaw or neck pain.   Notes that she does workout but she has been working out for the past year so no recent new activities or heavy lifting.  She does follow with a cardiologist Dr. Marlou Sa and sees him at least once a year.  She had an asthma issue yesterday and used her inhaler and it went away.  Denies any cold symptoms.  She has family history of heart disease.  Took her medications this morning.   Tobacco use: former      Past Medical History:  Diagnosis Date   Anxiety    Asthma    allergy induced asthma   Depression    Dysphagia    Environmental allergies    Hernia of abdominal wall    Herpes labialis    History of hiatal hernia    Lumbar herniated disc    Panic attacks    Urinary incontinence    Urticaria     Patient Active Problem List   Diagnosis Date Noted   Allergies 08/23/2022   Hyperlipidemia 06/30/2022   Herpes simplex 11/18/2021   Disorder of skin of trunk 11/16/2021   History of neoplasm 11/16/2021   Lentigo 11/16/2021   Melanocytic nevi of trunk 11/16/2021   Degeneration of lumbar intervertebral disc 11/16/2021   COVID 03/29/2021   Encounter to establish care 02/09/2021   Heart rate problem 02/23/2020   Benzodiazepine dependence ([place]) 06/05/2019   Esophageal dysphagia    Hiatal hernia    Anxiety 11/08/2018    Seasonal and perennial allergic rhinitis 02/26/2018   Displacement of lumbar intervertebral disc without myelopathy 12/28/2016   Low back pain 12/28/2016   Ventral incisional hernia without gangrene    Viral pharyngitis 05/05/2016   Sore throat 05/04/2016   Strep throat 04/19/2016   Chronic urticaria 01/17/2016   Acute cholecystitis 11/10/2015   Cholecystitis    History of colon polyps 10/15/2015   Chronic anxiety 10/05/2015   Diaphragmatic hernia 10/05/2015   Insomnia 10/05/2015   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 10/05/2015   Prinzmetal angina ([place]) 10/05/2015   Recurrent major depressive disorder ([place]) 10/05/2015   Chronic idiopathic urticaria 07/12/2015   Mild intermittent asthma without complication 63/89/3734   Detrusor muscle hypertonia 06/02/2015   Vitamin D deficiency 06/02/2015   Mild persistent asthma 05/17/2015   Other allergic rhinitis 05/17/2015   Idiopathic urticaria 05/17/2015   GERD (gastroesophageal reflux disease) 05/17/2015   Gastroesophageal reflux disease without esophagitis 05/17/2015    Past Surgical History:  Procedure Laterality Date   ABDOMINAL SURGERY  2001   MYOMECTOMY    BLADDER SUSPENSION  2003   [place] transobturator pubovaginal sling Dr Gaynelle Arabian   BREAST BIOPSY Right 12/21/2010   [place]   CHOLECYSTECTOMY N/A  11/10/2015   Procedure: LAPAROSCOPIC CHOLECYSTECTOMY;  Surgeon: Florene Glen, MD;  Location: [facility];  Service: General;  Laterality: N/A;   COLONOSCOPY  2015   Dr Dorrene German [place]   COLONOSCOPY WITH PROPOFOL N/A 05/30/2019   Procedure: COLONOSCOPY WITH PROPOFOL;  Surgeon: Virgel Manifold, MD;  Location: [facility];  Service: Endoscopy;  Laterality: N/A;   DILATION AND CURETTAGE OF UTERUS     ESOPHAGOGASTRODUODENOSCOPY (EGD) WITH PROPOFOL N/A 01/23/2019   Procedure: ESOPHAGOGASTRODUODENOSCOPY (EGD) WITH PROPOFOL;  Surgeon: Virgel Manifold, MD;  Location: [facility];  Service: Endoscopy;  Laterality:  N/A;   HYSTEROSCOPY     D&C, HYST X 3   VENTRAL HERNIA REPAIR N/A 12/26/2016   Procedure: HERNIA REPAIR VENTRAL ADULT;  Surgeon: Florene Glen, MD;  Location: [facility];  Service: General;  Laterality: N/A;    OB History     Gravida  3   Para  1   Term  1   Preterm      AB  2   Living  1      SAB  2   IAB      Ectopic      Multiple      Live Births  1            Home Medications    Prior to Admission medications   Medication Sig Start Date End Date Taking? Authorizing Provider  acyclovir (ZOVIRAX) 200 MG/5ML suspension Take 5 mLs (200 mg total) by mouth 5 (five) times daily. 11/15/21   [name], MD  albuterol (VENTOLIN HFA) 108 (90 Base) MCG/ACT inhaler INHALE 2 PUFFS EVERY 4 HOURS AS NEEDED FOR WHEEZING OR COUGHING SPELLS 07/19/21   Valentina Shaggy, MD  ALPRAZolam Duanne Moron) 1 MG tablet Take 0.5-1 mg by mouth 3 (three) times daily as needed for anxiety.     [provider]  azelastine (OPTIVAR) 0.05 % ophthalmic solution Place 1 drop into both eyes 2 (two) times daily as needed (for itchy eyes). 06/20/22   Valentina Shaggy, MD  cetirizine (ZYRTEC) 10 MG tablet Take 10 mg by mouth daily.    [provider]  Cholecalciferol (VITAMIN D3) 3000 units TABS Take 3,000 Units by mouth daily.    [provider]  EPINEPHrine 0.3 mg/0.3 mL IJ SOAJ injection Inject 0.3 mg into the muscle as needed for anaphylaxis. Patient not taking: Reported on 07/14/2022 02/09/22   Valentina Shaggy, MD  estradiol (VIVELLE-DOT) 0.05 MG/24HR patch UNWRAP AND APPLY 1 PATCH TO SKIN TWICE A WEEK 08/15/22   [name], MD  Estradiol 10 MCG TABS vaginal tablet Place 1 tablet (10 mcg total) vaginally 2 (two) times a week. May start with 1 tab vaginally HS x 2 weeks, then long term 1 tab vaginally twice a week. 07/17/22   [name], MD  famotidine (PEPCID) 20 MG tablet Take 1 tablet (20 mg total) by mouth 2 (two) times daily. 06/03/20    Valentina Shaggy, MD  fexofenadine (ALLEGRA) 180 MG tablet Take 1 tablet (180 mg total) by mouth daily. 06/03/20   Valentina Shaggy, MD  fluticasone [facility]) 50 MCG/ACT nasal spray Place 1 spray into both nostrils as needed. 07/12/21   [provider]  hydrOXYzine (ATARAX/VISTARIL) 25 MG tablet Take by mouth. 02/20/20   [provider]  ipratropium-albuterol (DUONEB) 0.5-2.5 (3) MG/3ML SOLN Take 3 mLs by nebulization every 6 (six) hours as needed. 02/09/22   Valentina Shaggy, MD  omalizumab Arvid Right) 150  MG/ML prefilled syringe INJECT 2 SYRINGES UNDER THE SKIN EVERY 4 WEEKS 05/18/22   Valentina Shaggy, MD  progesterone (PROMETRIUM) 100 MG capsule TAKE 1 CAPSULE(100 MG) BY MOUTH AT BEDTIME 08/15/22   [name], MD  VENTOLIN HFA 108 (90 Base) MCG/ACT inhaler Inhale 2 puffs into the lungs every 4 (four) hours as needed for wheezing or shortness of breath. 02/09/22   Valentina Shaggy, MD  medroxyPROGESTERone (PROVERA) 2.5 MG tablet Take 1 tablet (2.5 mg total) by mouth daily. 03/25/19 11/20/19  Fontaine, Belinda Block, MD    Family History Family History  Problem Relation Age of Onset   Healthy Mother    Myasthenia gravis Father    Alzheimer's disease Father    Myasthenia gravis Brother    Cancer Maternal Uncle        2 MATERNAL UNCLES W COLON CA   Heart disease Maternal Grandmother    Hypertension Maternal Grandmother    Cancer Maternal Grandfather        Lung   Cancer Paternal Grandfather        Lung   Asthma Neg Hx     Social History Social History   Tobacco Use   Smoking status: Former    Packs/day: 1.00    Types: Cigarettes    Quit date: 08/13/2012    Years since quitting: 10.0   Smokeless tobacco: Never  Vaping Use   Vaping Use: Never used  Substance Use Topics   Alcohol use: Not Currently   Drug use: No     Allergies   Dust mite extract, Latex, Clonazepam, Escitalopram, Other, Codeine, and Montelukast   Review of  Systems Review of Systems :negative unless otherwise stated in HPI.      Physical Exam Triage Vital Signs ED Triage Vitals  Enc Vitals Group     BP 08/23/22 0859 121/64     Pulse Rate 08/23/22 0859 87     Resp 08/23/22 0859 16     Temp 08/23/22 0859 97.7 F (36.5 C)     Temp Source 08/23/22 0859 Oral     SpO2 08/23/22 0859 100 %     Weight --      Height --      Head Circumference --      Peak Flow --      Pain Score 08/23/22 0855 6     Pain Loc --      Pain Edu? --      Excl. in [place]? --    No data found.  Updated Vital Signs BP 121/64 (BP Location: Right Arm)   Pulse 87   Temp 97.7 F (36.5 C) (Oral)   Resp 16   LMP 04/12/2012 Comment: having spotting  SpO2 100%   Visual Acuity Right Eye Distance:   Left Eye Distance:   Bilateral Distance:    Right Eye Near:   Left Eye Near:    Bilateral Near:     Physical Exam  GEN:  non-ill appearing female, in no acute distress  CV: regular rate and rhythm,  no murmurs, rubs or gallops  appreciated, no JVP  CHEST WALL: non-tender, no deformity, no overlying skin changes  RESP: no increased work of breathing, clear to ascultation bilaterally MSK: no LE edema SKIN: warm, dry, no rash on visible skin NEURO: alert, moves all extremities appropriately PSYCH: Normal affect, appropriate speech and behavior   UC Treatments / Results  Labs (all labs ordered are listed, but only abnormal results are displayed) Labs Reviewed  CBC WITH DIFFERENTIAL/PLATELET - Abnormal; Notable for the following components:      Result Value   Platelets 434 (*)    All other components within normal limits  BASIC METABOLIC PANEL - Abnormal; Notable for the following components:   Calcium 8.7 (*)    All other components within normal limits  TROPONIN I (HIGH SENSITIVITY)  TROPONIN I (HIGH SENSITIVITY)    EKG   Radiology DG Chest 2 View  Result Date: 08/23/2022 CLINICAL DATA:  Chest pain. EXAM: CHEST - 2 VIEW COMPARISON:  CT chest dated  March 04, 2019 FINDINGS: The heart size and mediastinal contours are within normal limits. Both lungs are clear. The visualized skeletal structures are unremarkable. IMPRESSION: No active cardiopulmonary disease. Electronically Signed   By: Keane Police D.O.   On: 08/23/2022 10:56    Procedures Procedures (including critical care time)  Medications Ordered in UC Medications - No data to display  Initial Impression / Assessment and Plan / UC Course  I have reviewed the triage vital signs and the nursing notes.  Pertinent labs & imaging results that were available during my care of the patient were reviewed by me and considered in my medical decision making (see chart for details).       Patient is a 62 y.o. female with history of HLD, hiatal hernia, GERD, anxiety, variant angina  who presents with acute anterior chest pain that radiated to her left upper back and shoulder down her arm while in a stressful meeting at work.   On chart review, she saw Dr. Marlou Sa her cardiologist in August 2023 where they discussed considering diltiazem for her tachycardia. However, pt never started this medication as her heart rate has been lower and she didn't want to add another medication. Hannah [name] has no history of PE.  Unable to [facility] pt out due to age.  EKG obtained and is without ST elevations or concern for ischemia, NSR.  CBC unremarkable for anemia.  There were no significant electrolyte abnormalitis seen on BMP.  Chest x-ray did not show bacterial pneumonia, pleural effusions or pneumothorax. Initial hsTrop was negative.  I do not think his chest pain is acute cardiopulmonary related. No GERD symptoms. Not likely illicit drug induced. No other anxiety signs or symptoms. Exam not concerning for costochondritis as pain  is not reproducible, on exam.  This does not appear to be MSK related. She does have history of variant angina listed but no notes containing discussion of this issue.  Advised pt to follow up with  her cardiologist.  She voiced understanding.   Discussed MDM, treatment plan and plan for follow-up with patient who agrees with plan.     Final Clinical Impressions(s) / UC Diagnoses   Final diagnoses:  Other chest pain  Angina at rest     Discharge Instructions      The heart marker for a heart attack was normal.  Additionally, you do not have any electrolyte abnormalities or anemia to suggest why you are having this pain.  The electrical tracing of your heart (EKG) was normal.  Your chest xray was also normal.   Be sure to follow-up with your cardiologist in the next 1 to 2 weeks.  Consider starting the diltiazem, scribed by your cardiologist as this will help with Prinzmeta/variant angina.  Go to ED for red flag symptoms, including; fevers you cannot reduce with Tylenol/Motrin, severe headaches, vision changes, numbness/weakness in part of the body, lethargy, confusion, intractable vomiting, severe dehydration, chest pain,  breathing difficulty, severe persistent abdominal or pelvic pain, signs of severe infection (increased redness, swelling of an area), feeling faint or passing out, dizziness, etc. You should especially go to the ED for sudden acute worsening of condition if you do not elect to go at this time.         ED Prescriptions   None    PDMP not reviewed this encounter.   Lyndee Hensen, DO 08/23/22 1655

## 2022-08-23 NOTE — Discharge Instructions (Addendum)
The heart marker for a heart attack was normal.  Additionally, you do not have any electrolyte abnormalities or anemia to suggest why you are having this pain.  The electrical tracing of your heart (EKG) was normal.  Your chest xray was also normal.   Be sure to follow-up with your cardiologist in the next 1 to 2 weeks.  Consider starting the diltiazem, scribed by your cardiologist as this will help with Prinzmeta/variant angina.  Go to ED for red flag symptoms, including; fevers you cannot reduce with Tylenol/Motrin, severe headaches, vision changes, numbness/weakness in part of the body, lethargy, confusion, intractable vomiting, severe dehydration, chest pain, breathing difficulty, severe persistent abdominal or pelvic pain, signs of severe infection (increased redness, swelling of an area), feeling faint or passing out, dizziness, etc. You should especially go to the ED for sudden acute worsening of condition if you do not elect to go at this time.

## 2023-03-05 ENCOUNTER — Other Ambulatory Visit: Payer: Self-pay | Admitting: Family Medicine

## 2023-03-05 DIAGNOSIS — Z1231 Encounter for screening mammogram for malignant neoplasm of breast: Secondary | ICD-10-CM

## 2023-03-20 ENCOUNTER — Other Ambulatory Visit: Payer: Self-pay | Admitting: Allergy & Immunology

## 2023-03-23 IMAGING — MG MM DIGITAL SCREENING BILAT W/ TOMO AND CAD
8 series · 8 of 24 positions shown · non-contrast
Comparison: Previous exam(s).

CLINICAL DATA: Screening.

EXAM:
DIGITAL SCREENING BILATERAL MAMMOGRAM WITH TOMOSYNTHESIS AND CAD
TECHNIQUE: Bilateral screening digital craniocaudal and mediolateral oblique
mammograms were obtained. Bilateral screening digital breast
tomosynthesis was performed. The images were evaluated with
computer-aided detection.

[L MLO synth-2D]
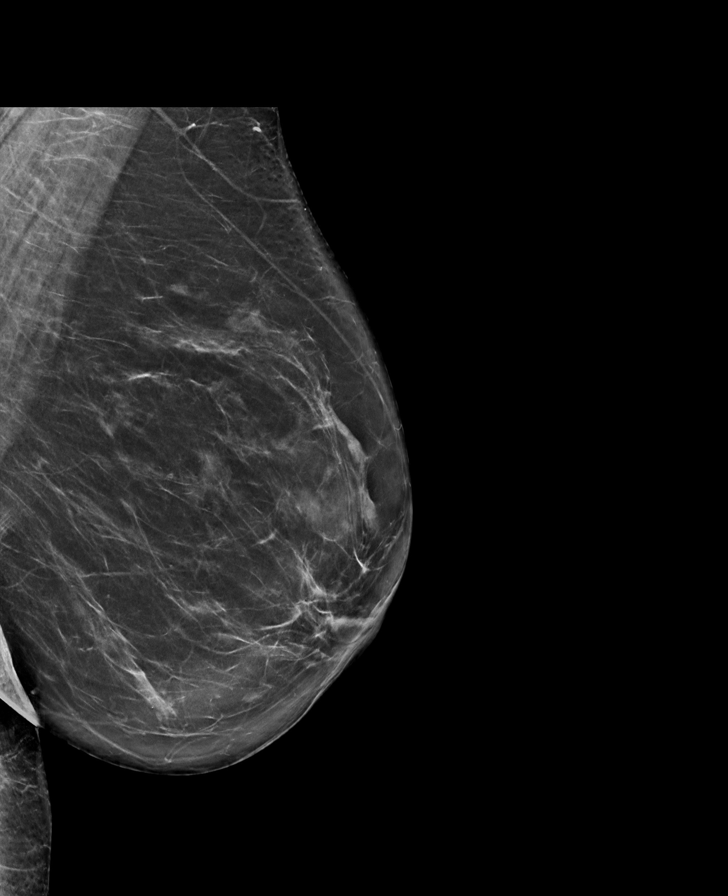

[R MLO synth-2D]
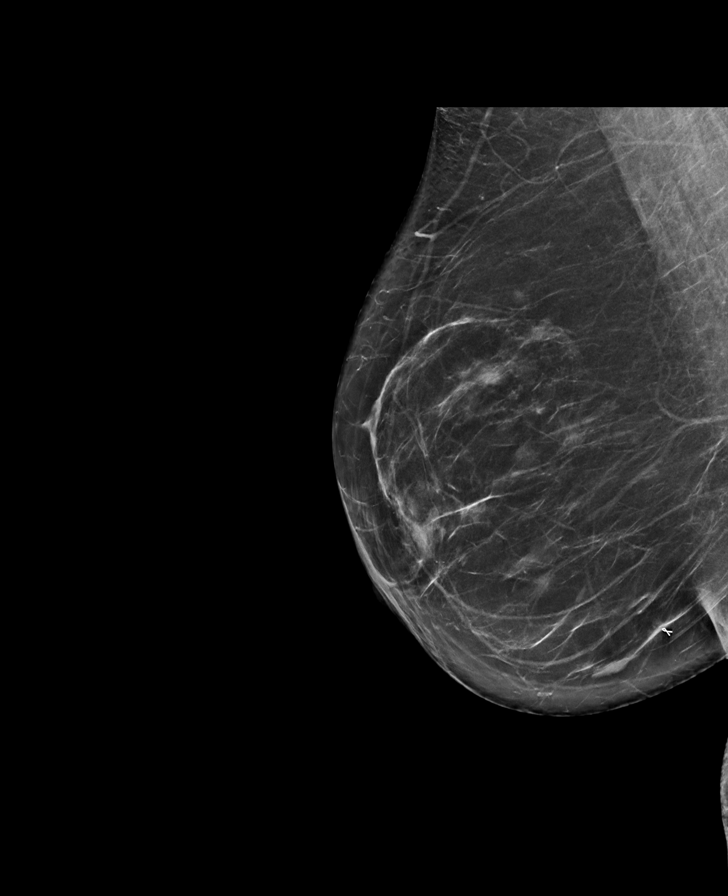

[L CC synth-2D]
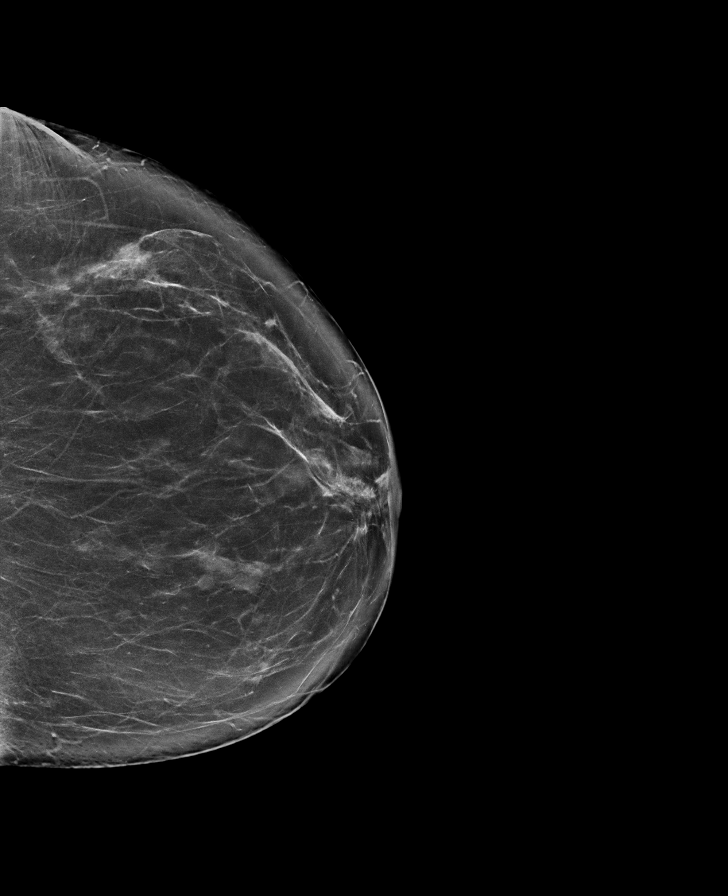

[R CC synth-2D]
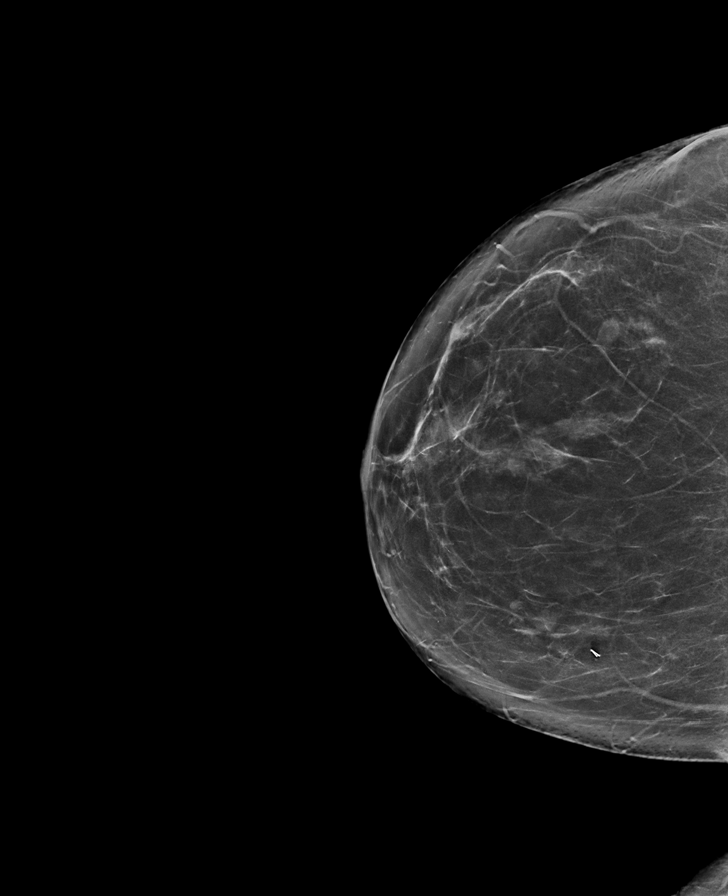

[L MLO tomo · tomo slice 43/85.0]
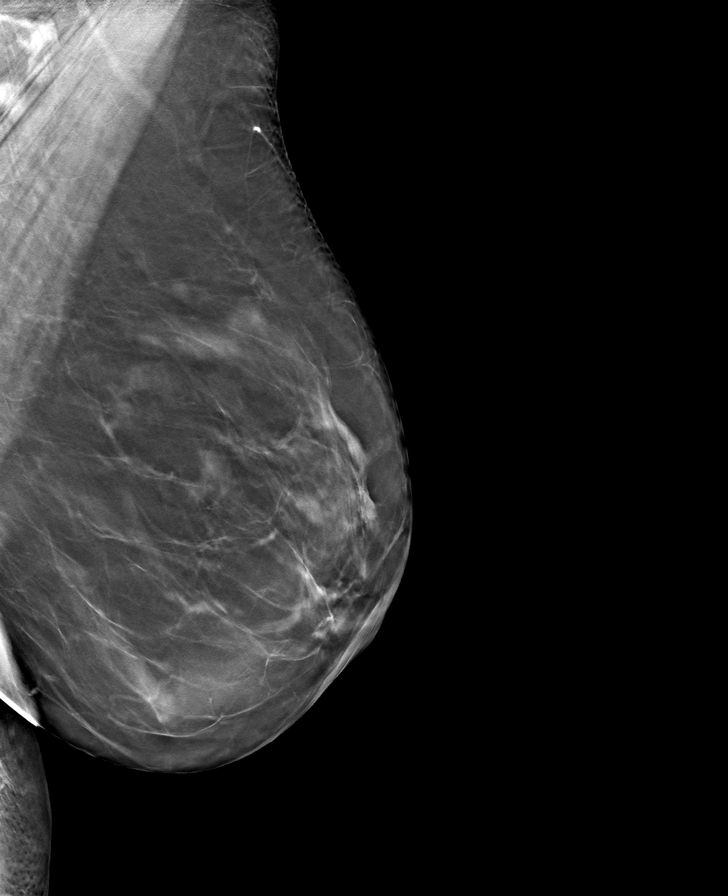

[L CC tomo · tomo slice 40/79.0]
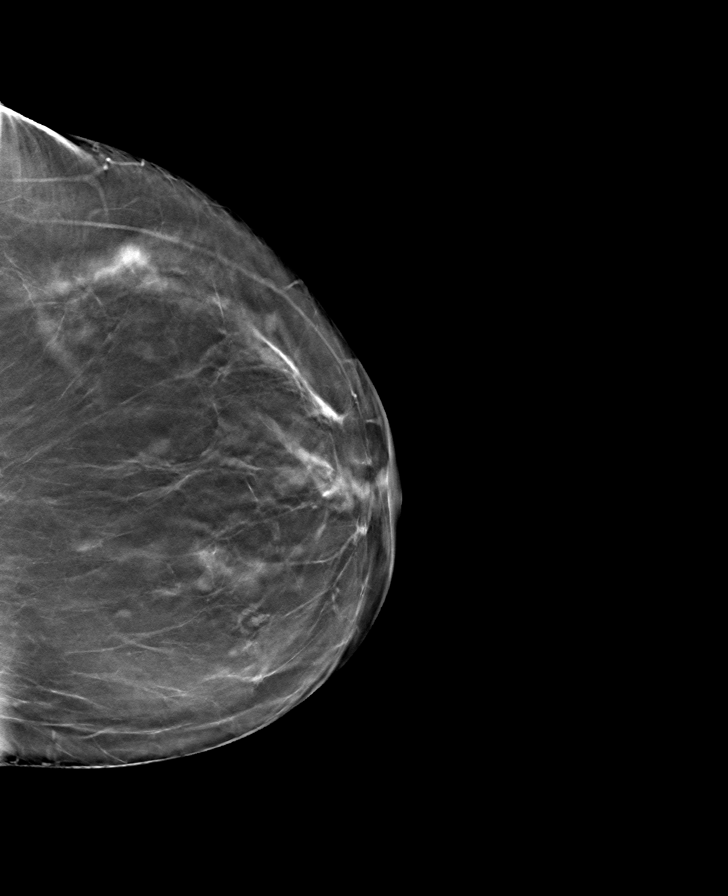

[R MLO tomo · tomo slice 41/80.0]
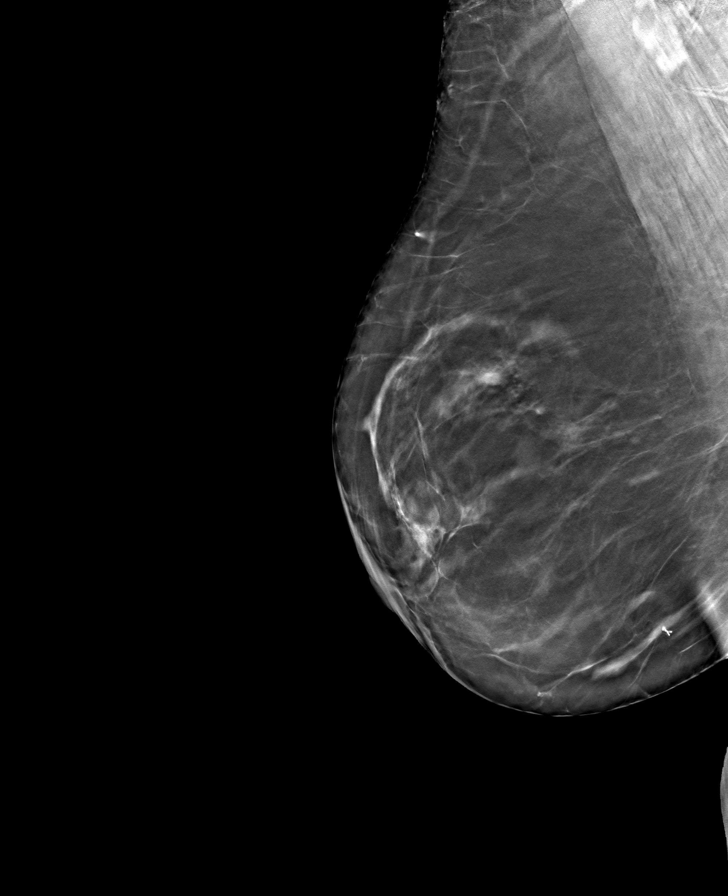

[R CC tomo · tomo slice 40/79.0]
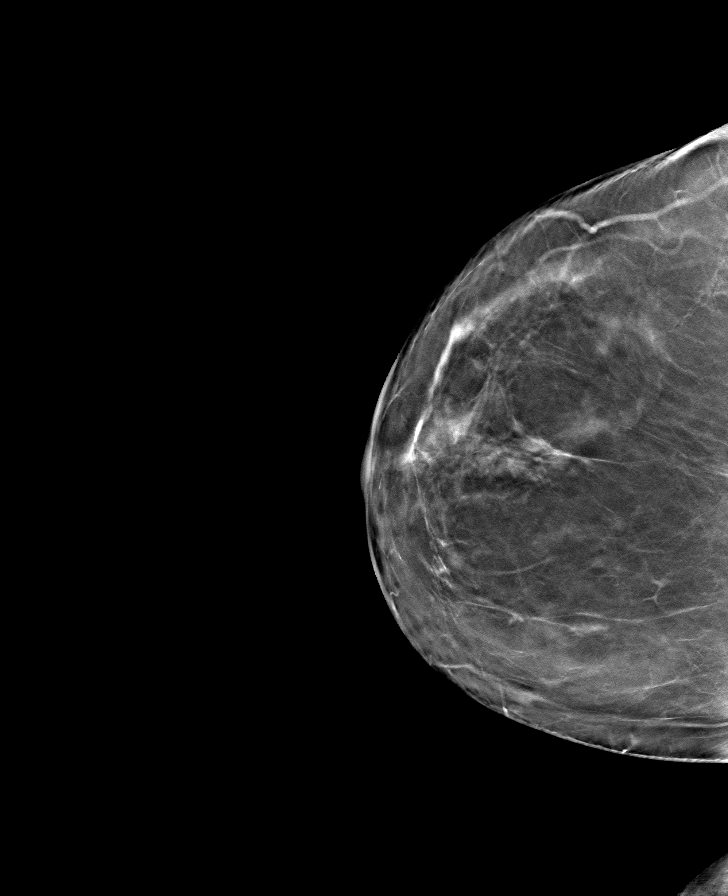

[8 of 24 positions shown; findings below may reference images not displayed]

ACR Breast Density Category b: There are scattered areas of
fibroglandular density.
FINDINGS: There are no findings suspicious for malignancy.
IMPRESSION: No mammographic evidence of malignancy. A result letter of this
screening mammogram will be mailed directly to the patient.

RECOMMENDATION:
Screening mammogram in one year. (Code:51-O-LD2)

BI-RADS CATEGORY  1: Negative.

## 2023-03-29 ENCOUNTER — Other Ambulatory Visit: Payer: Self-pay | Admitting: *Deleted

## 2023-03-29 MED ORDER — ALBUTEROL SULFATE HFA 108 (90 BASE) MCG/ACT IN AERS: INHALATION_SPRAY | RESPIRATORY_TRACT | 0 refills | Status: DC

## 2023-04-20 ENCOUNTER — Ambulatory Visit
Admission: RE | Admit: 2023-04-20 | Discharge: 2023-04-20 | Disposition: A | Discharge status: Home / Self Care | Payer: No Typology Code available for payment source | Source: Ambulatory Visit | Attending: Family Medicine | Admitting: Family Medicine

## 2023-04-20 DIAGNOSIS — Z1231 Encounter for screening mammogram for malignant neoplasm of breast: Secondary | ICD-10-CM

## 2023-04-30 ENCOUNTER — Encounter: Payer: Self-pay | Admitting: Family Medicine

## 2023-04-30 ENCOUNTER — Other Ambulatory Visit: Payer: Self-pay

## 2023-04-30 ENCOUNTER — Ambulatory Visit (INDEPENDENT_AMBULATORY_CARE_PROVIDER_SITE_OTHER): Payer: No Typology Code available for payment source | Admitting: Family Medicine

## 2023-04-30 VITALS — BP 124/70 | HR 78 | Temp 98.0°F | Resp 12 | Wt 176.5 lb

## 2023-04-30 DIAGNOSIS — J4520 Mild intermittent asthma, uncomplicated: Secondary | ICD-10-CM

## 2023-04-30 DIAGNOSIS — J3089 Other allergic rhinitis: Secondary | ICD-10-CM | POA: Diagnosis not present

## 2023-04-30 DIAGNOSIS — H1013 Acute atopic conjunctivitis, bilateral: Secondary | ICD-10-CM

## 2023-04-30 DIAGNOSIS — L501 Idiopathic urticaria: Secondary | ICD-10-CM

## 2023-04-30 DIAGNOSIS — T63481D Toxic effect of venom of other arthropod, accidental (unintentional), subsequent encounter: Secondary | ICD-10-CM

## 2023-04-30 DIAGNOSIS — H1010 Acute atopic conjunctivitis, unspecified eye: Secondary | ICD-10-CM | POA: Insufficient documentation

## 2023-04-30 DIAGNOSIS — J302 Other seasonal allergic rhinitis: Secondary | ICD-10-CM | POA: Diagnosis not present

## 2023-04-30 DIAGNOSIS — T63481A Toxic effect of venom of other arthropod, accidental (unintentional), initial encounter: Secondary | ICD-10-CM | POA: Insufficient documentation

## 2023-04-30 MED ORDER — VENTOLIN HFA 108 (90 BASE) MCG/ACT IN AERS
2.0000 | INHALATION_SPRAY | RESPIRATORY_TRACT | 1 refills | Status: DC | PRN
Start: 2023-04-30 — End: 2023-10-30

## 2023-04-30 MED ORDER — EPINEPHRINE 0.3 MG/0.3ML IJ SOAJ: 0.3000 mg | INTRAMUSCULAR | 2 refills | Status: DC | PRN

## 2023-04-30 MED ORDER — FEXOFENADINE HCL 180 MG PO TABS: 180.0000 mg | ORAL_TABLET | Freq: Every day | ORAL | 5 refills | Status: AC

## 2023-04-30 MED ORDER — FAMOTIDINE 20 MG PO TABS: 20.0000 mg | ORAL_TABLET | Freq: Every day | ORAL | 5 refills | Status: AC

## 2023-04-30 NOTE — Progress Notes (Signed)
[address] [place] [address] Dept: [phone]  FOLLOW UP NOTE  Patient ID: Hannah [name], female    DOB: 06-Jul-1961  Age: 62 y.o. MRN: [id number] Date of Office Visit: 04/30/2023  Assessment  Chief Complaint: Follow-up  HPI Hannah [name] is a 62 year old female who presents to the clinic for a follow-up visit.  She was last seen in this clinic on 02/09/2022 by Dr. Dellis Anes for evaluation of asthma, allergic rhinitis, urticaria on Xolair, and stinging insect allergy.  At today's visit, she reports that her asthma has been moderately well controlled with occasional wheeze occuring mainly at night during season changes as the main symptom. She denies shortness of breath and cough with activity and rest. She continues albuterol infrequently and has not used DuoNeb via nebulizer since her last visit to this clinic.   Allergic rhinitis is reported as well controlled at this time. She reports that she occasionally experiences nasal congestion and rhinorrhea during season changes. She continues antihistamines daily and occasionally uses Flonase with relief of symptoms. Her last environmental allergy skin testing was in 2015 and was positive to tree pollen, mold, dust mites, cat, and dog.  Allergic conjunctivitis is reported as moderately well-controlled with symptoms including red and itchy eyes.  She is currently using azelastine eyedrops with moderate relief of symptoms.  She has previously used Pataday with relief of symptoms.  Urticaria is reported as well controlled with very infrequent breakouts. She continues Allegra and famotidine 20 mg once in the morning and takes cetirizine 10 mg once at night with relief of symptoms. She continues home administration of Xolair 300 mg once every 28 days. She denies any large or local reactions. She reports a significant decrease in hive breakouts while continuing on Xolair injections. EpiPen set is out of date and will be reordered at  today's visit.   She continues to avoid stinging insects with no stings since her last visit to this clinic.   Her current medications are listed in the chart.   Drug Allergies:  Allergies  Allergen Reactions   Dust Mite Extract Hives   Latex Hives, Itching and Rash    no breathing issues    Clonazepam Other (See Comments)    Numb   Escitalopram Swelling   Other    Codeine Nausea And Vomiting    SENSITIVE   Montelukast Other (See Comments)    nightmares    Physical Exam: BP 124/70   Pulse 78   Temp 98 F (36.7 C) (Axillary)   Resp 12   Wt 176 lb 8 oz (80.1 kg)   LMP 04/12/2012 Comment: having spotting  SpO2 94%   BMI 31.02 kg/m    Physical Exam Vitals reviewed.  Constitutional:      Appearance: Normal appearance.  HENT:     Head: Normocephalic and atraumatic.     Right Ear: Tympanic membrane normal.     Left Ear: Tympanic membrane normal.     Nose:     Comments: Bilateral nares slightly erythematous with thin clear nasal drainage noted. Pharynx normal. Ears normal. Eyes normal.    Mouth/Throat:     Pharynx: Oropharynx is clear.  Eyes:     Conjunctiva/sclera: Conjunctivae normal.  Cardiovascular:     Rate and Rhythm: Normal rate and regular rhythm.     Heart sounds: Normal heart sounds. No murmur heard. Pulmonary:     Effort: Pulmonary effort is normal.     Breath sounds: Normal breath sounds.  Comments: Lungs clear to asucultation Musculoskeletal:        General: Normal range of motion.     Cervical back: Normal range of motion and neck supple.  Skin:    General: Skin is warm and dry.  Neurological:     Mental Status: She is alert and oriented to person, place, and time.  Psychiatric:        Mood and Affect: Mood normal.        Behavior: Behavior normal.        Thought Content: Thought content normal.        Judgment: Judgment normal.     Assessment and Plan: 1. Mild intermittent asthma without complication   2. Seasonal allergic  conjunctivitis   3. Seasonal and perennial allergic rhinitis   4. Idiopathic urticaria   5. Allergic reaction to insect sting, accidental or unintentional, subsequent encounter     Meds ordered this encounter  Medications   EPINEPHrine 0.3 mg/0.3 mL IJ SOAJ injection    Sig: Inject 0.3 mg into the muscle as needed for anaphylaxis.    Dispense:  1 each    Refill:  2   fexofenadine (ALLEGRA) 180 MG tablet    Sig: Take 1 tablet (180 mg total) by mouth daily.    Dispense:  30 tablet    Refill:  5   famotidine (PEPCID) 20 MG tablet    Sig: Take 1 tablet (20 mg total) by mouth daily.    Dispense:  30 tablet    Refill:  5   VENTOLIN HFA 108 (90 Base) MCG/ACT inhaler    Sig: Inhale 2 puffs into the lungs every 4 (four) hours as needed for wheezing or shortness of breath.    Dispense:  18 g    Refill:  1    Dispense [facility] please    Patient Instructions  Asthma Well-controlled Continue albuterol 2 puffs once every 4-6 hours as needed for cough or wheeze or instead use Duoneb once every 6 hours as needed for cough or wheeze You may use albuterol 2 puffs 5 to 15 minutes before activity to decrease cough or wheeze  Allergic rhinitis Well-controlled Continue Allegra 180 mg once a day as needed for runny nose or itch Continue Flonase 2 sprays in each nostril once a day as needed for a stuffy nose Consider saline nasal rinses as needed for nasal symptoms. Use this before any medicated nasal sprays for best result  Allergic conjunctivitis Moderately well-controlled Some over the counter eye drops include Pataday one drop in each eye once a day as needed for red, itchy eyes OR Zaditor one drop in each eye twice a day as needed for red itchy eyes. Avoid eye drops that say red eye relief as they may contain medications that dry out your eyes.   Hives (urticaria) Well-controlled Continue Allegra 180 mg and famotidine 20 mg once a day in the morning Continue cetirizine 10 mg  once in the evening May use Benadryl (diphenhydramine) as needed for breakthrough hives       If symptoms return, then step up dosage Continue Xolair 300 mg injections once every 28 days and have access to an epinephrine autoinjector set Keep a detailed symptom journal including foods eaten, contact with allergens, medications taken, weather changes.    Stinging insect allergy Stable Continue to avoid stinging insects. In case of an allergic reaction, take Benadryl 50 mg every 4 hours, and if life-threatening symptoms occur, inject with EpiPen 0.3 mg.  Call the clinic if this treatment plan is not working well for you.  Follow up in 6 months or sooner if needed.   Return in about 6 months (around 10/28/2023), or if symptoms worsen or fail to improve.    Thank you for the opportunity to care for this patient.  Please do not hesitate to contact me with questions.  Thermon Leyland, FNP [facility]

## 2023-04-30 NOTE — Patient Instructions (Addendum)
Asthma Well-controlled Continue albuterol 2 puffs once every 4-6 hours as needed for cough or wheeze or instead use Duoneb once every 6 hours as needed for cough or wheeze You may use albuterol 2 puffs 5 to 15 minutes before activity to decrease cough or wheeze  Allergic rhinitis Well-controlled Continue Allegra 180 mg once a day as needed for runny nose or itch Continue Flonase 2 sprays in each nostril once a day as needed for a stuffy nose Consider saline nasal rinses as needed for nasal symptoms. Use this before any medicated nasal sprays for best result  Allergic conjunctivitis Moderately well-controlled Some over the counter eye drops include Pataday one drop in each eye once a day as needed for red, itchy eyes OR Zaditor one drop in each eye twice a day as needed for red itchy eyes. Avoid eye drops that say red eye relief as they may contain medications that dry out your eyes.   Hives (urticaria) Well-controlled Continue Allegra 180 mg and famotidine 20 mg once a day in the morning Continue cetirizine 10 mg once in the evening May use Benadryl (diphenhydramine) as needed for breakthrough hives       If symptoms return, then step up dosage Continue Xolair 300 mg injections once every 28 days and have access to an epinephrine autoinjector set Keep a detailed symptom journal including foods eaten, contact with allergens, medications taken, weather changes.    Stinging insect allergy Stable Continue to avoid stinging insects. In case of an allergic reaction, take Benadryl 50 mg every 4 hours, and if life-threatening symptoms occur, inject with EpiPen 0.3 mg.  Call the clinic if this treatment plan is not working well for you.  Follow up in 6 months or sooner if needed.

## 2023-05-02 ENCOUNTER — Other Ambulatory Visit: Payer: Self-pay | Admitting: *Deleted

## 2023-05-02 ENCOUNTER — Encounter: Payer: Self-pay | Admitting: Family Medicine

## 2023-05-27 ENCOUNTER — Encounter: Payer: Self-pay | Admitting: Allergy & Immunology

## 2023-05-28 ENCOUNTER — Other Ambulatory Visit: Payer: Self-pay

## 2023-05-28 MED ORDER — AZELASTINE HCL 0.05 % OP SOLN: 1.0000 [drp] | Freq: Two times a day (BID) | OPHTHALMIC | 5 refills | Status: DC | PRN

## 2023-06-11 ENCOUNTER — Encounter: Payer: Self-pay | Admitting: Allergy & Immunology

## 2023-06-13 MED ORDER — TACROLIMUS 0.1 % EX OINT: TOPICAL_OINTMENT | Freq: Two times a day (BID) | CUTANEOUS | 2 refills | Status: DC

## 2023-06-29 MED ORDER — PREDNISONE 10 MG PO TABS
ORAL_TABLET | ORAL | 0 refills | Status: DC
Start: 2023-06-29 — End: 2023-10-30

## 2023-06-29 NOTE — Addendum Note (Signed)
Addended by: Alfonse Spruce on: 06/29/2023 05:21 PM   Modules accepted: Orders

## 2023-07-16 ENCOUNTER — Other Ambulatory Visit: Payer: Self-pay | Admitting: Allergy & Immunology

## 2023-07-17 ENCOUNTER — Encounter: Payer: Self-pay | Admitting: Allergy & Immunology

## 2023-07-18 ENCOUNTER — Other Ambulatory Visit: Payer: Self-pay

## 2023-07-18 ENCOUNTER — Encounter: Payer: Self-pay | Admitting: Family

## 2023-07-18 ENCOUNTER — Telehealth: Payer: No Typology Code available for payment source | Admitting: Family

## 2023-07-18 DIAGNOSIS — T63481D Toxic effect of venom of other arthropod, accidental (unintentional), subsequent encounter: Secondary | ICD-10-CM | POA: Diagnosis not present

## 2023-07-18 DIAGNOSIS — J4520 Mild intermittent asthma, uncomplicated: Secondary | ICD-10-CM

## 2023-07-18 DIAGNOSIS — L501 Idiopathic urticaria: Secondary | ICD-10-CM

## 2023-07-18 DIAGNOSIS — J45909 Unspecified asthma, uncomplicated: Secondary | ICD-10-CM | POA: Diagnosis not present

## 2023-07-18 DIAGNOSIS — H1010 Acute atopic conjunctivitis, unspecified eye: Secondary | ICD-10-CM

## 2023-07-18 DIAGNOSIS — J302 Other seasonal allergic rhinitis: Secondary | ICD-10-CM

## 2023-07-18 MED ORDER — PREDNISONE 10 MG PO TABS: ORAL_TABLET | ORAL | 0 refills | Status: DC

## 2023-07-18 NOTE — Telephone Encounter (Signed)
Pt scheduled for today at 330

## 2023-07-18 NOTE — Progress Notes (Signed)
RE: Hannah [name] MRN: [id number] DOB: 1961-04-05 Date of Video Visit: 07/18/2023  Referring provider: Olena Leatherwood, FNP Primary care provider: Olena Leatherwood, FNP  Chief Complaint: Other    Follow Up Visit via video visit: I connected with Hannah [name] for a follow up on 07/18/23 by telephone and verified that I am speaking with the correct person using two identifiers.   I discussed the limitations, risks, security and privacy concerns of performing an evaluation and management service by video and the availability of in person appointments. I also discussed with the patient that there may be a patient responsible charge related to this service. The patient expressed understanding and agreed to proceed.  Patient is at home Provider is at the office.  Visit start time: 3:22 PM Visit end time: 3:54 PM Insurance consent/check in by: Diamond Nickel Medical consent and medical assistant/nurse: Terence Lux.  History of Present Illness: She is a 62 y.o. female, who is being followed for asthma, allergic rhinitis, allergic conjunctivitis, chronic idiopathic urticaria, and stinging insect allergy. Her previous allergy office visit was on 04/30/23 with Thermon Leyland, FNP.   Chronic idiopathic urticaria: She reports that this Saturday she developed a hive near her left eye that is itchy and burning.  She is trying not to touch it so she keeps a wet cloth and pats it to keep her hands off of it.  Prior to that she was given a round of prednisone on November 15th, 2024 due to having hives on her face.  She reports that this round of hives started after being in the mountains.  She reports that she is allergic to everything in that environment.  The steroids she was given cleared everything up, but then this Saturday the hive returned near her left eye.  She sent a message asking for more prednisone.  She reports prior to that it has been a long time since she is needed steroids.  After reviewing epic  it looks like she is been on Xolair 300 mg every 4 weeks since October 04, 2020.  She does have an EpiPen and reports that it is up today.  Her last Xolair injection was this past Saturday (July 14, 2023) and she was a couple days late.  She does mention that 2 months ago that her Xolair was switched to one 300 mg syringe versus two 150 mg syringes and she wonders if this could be what caused her hives to flare.  Her hives started flaring after the switch from 2 syringes to 1 syringe.  She denies any concomitant cardiorespiratory and gastrointestinal symptoms.  She also reports that she is not using any new products, medications, or eating any new foods.  She also denies fever or chills.  She does continue to take Allegra 180 mg in the morning and famotidine 20 mg in the morning.  At night she takes cetirizine 10 mg and she just recently added back famotidine 20 mg at night.  Discussed the options of changing her Xolair to 300 mg every 2 weeks or adding on sulfasalazine.  For now she would like to do 1 short course of prednisone and see if switching back to two  150 mg Xolair syringes makes a change in her frequency of hives.   Asthma is reported as controlled.  She denies cough, wheeze, tightness in chest, shortness of breath, and nocturnal awakenings due to breathing problems.  Since her last office visit she has not made any trips to  the emergency room or urgent care due to breathing problems.  She does not use albuterol often.  The last time she had to use steroids was for hives in November.  Allergic rhinitis is reported as controlled.  She does report this may be due to her working remotely.  She denies rhinorrhea, nasal congestion, and postnasal drip.  She has not had any sinus infections since we last saw her.  She uses Flonase nasal spray as needed and her antihistamines as above.  Allergic conjunctivitis is reported as controlled.  She denies itchy watery eyes.  She has Optivar eyedrops to use as  needed.  Stinging insect allergy: She reports that she has not had any insect stings since her last office visit and always carries an EpiPen with her.  She reports her EpiPen is up-to-date.  Assessment and Plan: Cedric is a 62 y.o. female with: Patient Instructions  Asthma Well-controlled Continue albuterol 2 puffs once every 4-6 hours as needed for cough or wheeze or instead use Duoneb once every 6 hours as needed for cough or wheeze You may use albuterol 2 puffs 5 to 15 minutes before activity to decrease cough or wheeze  Asthma control goals:  Full participation in all desired activities (may need albuterol before activity) Albuterol use two time or less a week on average (not counting use with activity) Cough interfering with sleep two time or less a month Oral steroids no more than once a year No hospitalizations  Allergic rhinitis Well-controlled Continue Allegra 180 mg once a day as needed for runny nose or itch Continue Flonase 2 sprays in each nostril once a day as needed for a stuffy nose Consider saline nasal rinses as needed for nasal symptoms. Use this before any medicated nasal sprays for best result  Allergic conjunctivitis Controlled Some over the counter eye drops include Pataday one drop in each eye once a day as needed for red, itchy eyes OR Zaditor one drop in each eye twice a day as needed for red itchy eyes. Avoid eye drops that say red eye relief as they may contain medications that dry out your eyes.   Hives (urticaria) Not well  controlled -Continue Allegra 180 mg and famotidine 20 mg once a day in the morning -Continue cetirizine 10 mg and famotidine 20 mg once in the evening May use Benadryl (diphenhydramine) as needed for breakthrough hives       If symptoms return, then step up dosage -Continue Xolair 300 mg injections once every 28 days and have access to an epinephrine autoinjector set. Will send a message to Tammy, our biologics coordinator, about  seeing if we can get Xolair 150  mg (2 syringes) approved again rather than 300 mg in one syringe. Discussed if she continues to have hives we can think of changing her frequency of Xolair to every 2 weeks or potentially add on sulfasalazine -Start prednisone 10 mg taking 1 tablet twice a day for 5 days and then stop. Reviewed side effects of prednisone such as osteoporosis, avascular necrosis, cataracts, worsening of reflux, changes in mood and appetite, etc -Keep a detailed symptom journal including foods eaten, contact with allergens, medications taken, weather changes.    Stinging insect allergy Stable Continue to avoid stinging insects. In case of an allergic reaction, take Benadryl 50 mg every 4 hours, and if life-threatening symptoms occur, inject with EpiPen 0.3 mg.  Call the clinic if this treatment plan is not working well for you.  Keep already scheduled follow  up appointment on  10/30/23 at 11 AM with Dr. Dellis Anes or sooner if needed.  Return in about 3 months (around 10/30/2023), or if symptoms worsen or fail to improve.  Meds ordered this encounter  Medications   predniSONE (DELTASONE) 10 MG tablet    Sig: Take 1 tablet twice a day for 5 days and then stop    Dispense:  10 tablet    Refill:  0   Lab Orders  No laboratory test(s) ordered today    Diagnostics: None.  Medication List:  Current Outpatient Medications  Medication Sig Dispense Refill   acyclovir (ZOVIRAX) 200 MG/5ML suspension Take 5 mLs (200 mg total) by mouth 5 (five) times daily. 120 mL 0   ALPRAZolam (XANAX) 1 MG tablet Take 0.5-1 mg by mouth 3 (three) times daily as needed for anxiety.      azelastine (OPTIVAR) 0.05 % ophthalmic solution Place 1 drop into both eyes 2 (two) times daily as needed (for itchy eyes). 6 mL 5   cetirizine (ZYRTEC) 10 MG tablet Take 10 mg by mouth daily.     Cholecalciferol (VITAMIN D3) 3000 units TABS Take 3,000 Units by mouth daily.     EPINEPHrine 0.3 mg/0.3 mL IJ SOAJ  injection Inject 0.3 mg into the muscle as needed for anaphylaxis. 1 each 2   estradiol (VIVELLE-DOT) 0.05 MG/24HR patch UNWRAP AND APPLY 1 PATCH TO SKIN TWICE A WEEK 24 patch 3   Estradiol 10 MCG TABS vaginal tablet Place 1 tablet (10 mcg total) vaginally 2 (two) times a week. May start with 1 tab vaginally HS x 2 weeks, then long term 1 tab vaginally twice a week. 24 tablet 4   famotidine (PEPCID) 20 MG tablet Take 1 tablet (20 mg total) by mouth daily. 30 tablet 5   fexofenadine (ALLEGRA) 180 MG tablet Take 1 tablet (180 mg total) by mouth daily. 30 tablet 5   fluticasone (FLONASE) 50 MCG/ACT nasal spray Place 1 spray into both nostrils as needed.     hydrOXYzine (ATARAX/VISTARIL) 25 MG tablet Take by mouth.     ipratropium-albuterol (DUONEB) 0.5-2.5 (3) MG/3ML SOLN Take 3 mLs by nebulization every 6 (six) hours as needed. 90 mL 3   omalizumab (XOLAIR) 150 MG/ML prefilled syringe INJECT 2 SYRINGES UNDER THE SKIN EVERY 4 WEEKS 2 mL 11   predniSONE (DELTASONE) 10 MG tablet Take 1 tablet twice a day for 5 days and then stop 10 tablet 0   progesterone (PROMETRIUM) 100 MG capsule TAKE 1 CAPSULE(100 MG) BY MOUTH AT BEDTIME 90 capsule 3   tacrolimus (PROTOPIC) 0.1 % ointment Apply topically 2 (two) times daily. 100 g 2   UNABLE TO FIND GenTeal Tears     VENTOLIN HFA 108 (90 Base) MCG/ACT inhaler Inhale 2 puffs into the lungs every 4 (four) hours as needed for wheezing or shortness of breath. 18 g 1   predniSONE (DELTASONE) 10 MG tablet Take two tablets (20mg ) twice daily for three days, then one tablet (10mg ) twice daily for three days, then STOP. (Patient not taking: Reported on 07/18/2023) 18 tablet 0   Current Facility-Administered Medications  Medication Dose Route Frequency Provider Last Rate Last Admin   omalizumab Geoffry Paradise) injection 300 mg  300 mg Subcutaneous Q28 days Alfonse Spruce, MD   300 mg at 07/26/21 1341   Allergies: Allergies  Allergen Reactions   Dust Mite Extract Hives    Latex Hives, Itching and Rash    no breathing issues    Clonazepam Other (  See Comments)    Numb   Escitalopram Swelling   Other    Codeine Nausea And Vomiting    SENSITIVE   Montelukast Other (See Comments)    nightmares   I reviewed her past medical history, social history, family history, and environmental history and no significant changes have been reported from previous visit on 04/30/23.  Review of Systems Negative except as per HPI  Objective: Physical Exam Not obtained as encounter was done via video visit.   Previous notes and tests were reviewed.  I discussed the assessment and treatment plan with the patient. The patient was provided an opportunity to ask questions and all were answered. The patient agreed with the plan and demonstrated an understanding of the instructions.   The patient was advised to call back or seek an in-person evaluation if the symptoms worsen or if the condition fails to improve as anticipated.  I provided 32 minutes of non-face-to-face time during this encounter.  It was my pleasure to participate in Heavyn Ault's care today. Please feel free to contact me with any questions or concerns.   Sincerely,  Nehemiah Settle, FNP

## 2023-07-18 NOTE — Patient Instructions (Addendum)
Asthma Well-controlled Continue albuterol 2 puffs once every 4-6 hours as needed for cough or wheeze or instead use Duoneb once every 6 hours as needed for cough or wheeze You may use albuterol 2 puffs 5 to 15 minutes before activity to decrease cough or wheeze  Asthma control goals:  Full participation in all desired activities (may need albuterol before activity) Albuterol use two time or less a week on average (not counting use with activity) Cough interfering with sleep two time or less a month Oral steroids no more than once a year No hospitalizations  Allergic rhinitis Well-controlled Continue Allegra 180 mg once a day as needed for runny nose or itch Continue Flonase 2 sprays in each nostril once a day as needed for a stuffy nose Consider saline nasal rinses as needed for nasal symptoms. Use this before any medicated nasal sprays for best result  Allergic conjunctivitis Controlled Some over the counter eye drops include Pataday one drop in each eye once a day as needed for red, itchy eyes OR Zaditor one drop in each eye twice a day as needed for red itchy eyes. Avoid eye drops that say red eye relief as they may contain medications that dry out your eyes.   Hives (urticaria) Not well  controlled -Continue Allegra 180 mg and famotidine 20 mg once a day in the morning -Continue cetirizine 10 mg and famotidine 20 mg once in the evening May use Benadryl (diphenhydramine) as needed for breakthrough hives       If symptoms return, then step up dosage -Continue Xolair 300 mg injections once every 28 days and have access to an epinephrine autoinjector set. Will send a message to Tammy, our biologics coordinator, about seeing if we can get Xolair 150  mg (2 syringes) approved again rather than 300 mg in one syringe. Discussed if she continues to have hives we can think of changing her frequency of Xolair to every 2 weeks or potentially add on sulfasalazine -Start prednisone 10 mg taking 1  tablet twice a day for 5 days and then stop. Reviewed side effects of prednisone such as osteoporosis, avascular necrosis, cataracts, worsening of reflux, changes in mood and appetite, etc -Keep a detailed symptom journal including foods eaten, contact with allergens, medications taken, weather changes.    Stinging insect allergy Stable Continue to avoid stinging insects. In case of an allergic reaction, take Benadryl 50 mg every 4 hours, and if life-threatening symptoms occur, inject with EpiPen 0.3 mg.  Call the clinic if this treatment plan is not working well for you.  Keep already scheduled follow up appointment on  10/30/23 at 11 AM with Dr. Dellis Anes or sooner if needed.

## 2023-07-23 ENCOUNTER — Telehealth: Payer: Self-pay | Admitting: *Deleted

## 2023-07-23 MED ORDER — OMALIZUMAB 150 MG/ML ~~LOC~~ SOSY: PREFILLED_SYRINGE | SUBCUTANEOUS | 11 refills | Status: DC

## 2023-07-23 NOTE — Telephone Encounter (Signed)
-----   Message from Nehemiah Settle sent at 07/18/2023  4:12 PM EST ----- Christella Hartigan would like to go back to Xolair 150 mg ( 2 syringes) every 4 weeks. Is this possible rather than the one 300 mg syringe?

## 2023-07-23 NOTE — Telephone Encounter (Signed)
Advised patient new approval for Xolair 150mg  and rx sent

## 2023-07-24 NOTE — Telephone Encounter (Signed)
Thanks

## 2023-07-27 ENCOUNTER — Other Ambulatory Visit: Payer: Self-pay | Admitting: *Deleted

## 2023-07-27 MED ORDER — IPRATROPIUM-ALBUTEROL 0.5-2.5 (3) MG/3ML IN SOLN: RESPIRATORY_TRACT | 1 refills | Status: DC

## 2023-08-27 ENCOUNTER — Ambulatory Visit: Payer: No Typology Code available for payment source | Admitting: Obstetrics and Gynecology

## 2023-10-18 ENCOUNTER — Other Ambulatory Visit: Payer: Self-pay | Admitting: Obstetrics & Gynecology

## 2023-10-18 DIAGNOSIS — Z79890 Hormone replacement therapy (postmenopausal): Secondary | ICD-10-CM

## 2023-10-18 NOTE — Telephone Encounter (Signed)
 Med refill request: Progesterone Last AEX: 07/14/22 Next AEX: 12/18/23 Last MMG (if hormonal med) 04/20/23 Refill authorized: Please Advise, #[phone], 0 RF

## 2023-10-30 ENCOUNTER — Other Ambulatory Visit: Payer: Self-pay

## 2023-10-30 ENCOUNTER — Ambulatory Visit (INDEPENDENT_AMBULATORY_CARE_PROVIDER_SITE_OTHER): Payer: 59 | Admitting: Allergy & Immunology

## 2023-10-30 ENCOUNTER — Encounter: Payer: Self-pay | Admitting: Allergy & Immunology

## 2023-10-30 VITALS — BP 130/84 | HR 76 | Temp 99.2°F | Resp 16

## 2023-10-30 DIAGNOSIS — J3089 Other allergic rhinitis: Secondary | ICD-10-CM | POA: Diagnosis not present

## 2023-10-30 DIAGNOSIS — J4520 Mild intermittent asthma, uncomplicated: Secondary | ICD-10-CM

## 2023-10-30 DIAGNOSIS — K219 Gastro-esophageal reflux disease without esophagitis: Secondary | ICD-10-CM

## 2023-10-30 DIAGNOSIS — T63481D Toxic effect of venom of other arthropod, accidental (unintentional), subsequent encounter: Secondary | ICD-10-CM

## 2023-10-30 DIAGNOSIS — J302 Other seasonal allergic rhinitis: Secondary | ICD-10-CM

## 2023-10-30 DIAGNOSIS — L501 Idiopathic urticaria: Secondary | ICD-10-CM

## 2023-10-30 MED ORDER — VENTOLIN HFA 108 (90 BASE) MCG/ACT IN AERS
2.0000 | INHALATION_SPRAY | RESPIRATORY_TRACT | 1 refills | Status: DC | PRN
Start: 2023-10-30 — End: 2024-06-24

## 2023-10-30 MED ORDER — EPINEPHRINE 0.3 MG/0.3ML IJ SOAJ: 0.3000 mg | INTRAMUSCULAR | 2 refills | Status: AC | PRN

## 2023-10-30 MED ORDER — IPRATROPIUM-ALBUTEROL 0.5-2.5 (3) MG/3ML IN SOLN: RESPIRATORY_TRACT | 1 refills | Status: AC

## 2023-10-30 NOTE — Progress Notes (Signed)
 FOLLOW UP  Date of Service/Encounter:  10/30/23   Assessment:   Chronic idiopathic urticaria - needing 4 or 5 courses of prednisone annually for control   Gastroesophageal reflux disease   Allergic reaction to insect sting   Mild intermittent asthma without complication   Seasonal and perennial allergic rhinitis    Plan/Recommendations:   1. Chronic idiopathic urticaria - Continue with Xolair 300mg  every 28 days.  - Continue with Allegra in the morning and Zyrtec at night.   2. Allergic reaction to insect sting - EpiPen is up to date. - Continue to avoid stinging insects as tolerated.  3. Mild intermittent asthma without complication - Lung testing not done today. - We can check that next time.  - You are doing very well with the DuoNeb as needed, but if the use increases, we might need to consider adding a daily controller medication.  - Daily controller medication(s): NONE - Rescue medications: DuoNeb nebulizer one vial every 4-6 hours as needed - Asthma control goals:  * Full participation in all desired activities (may need albuterol before activity) * Albuterol use two time or less a week on average (not counting use with activity) * Cough interfering with sleep two time or less a month * Oral steroids no more than once a year * No hospitalizations  4. Seasonal and perennial allergic rhinitis - Continue with the allergy medications, as above.  5. Return in about 1 year (around 10/29/2024). You can have the follow up appointment with Dr. Dellis Anes or a Nurse Practicioner (our Nurse Practitioners are excellent and always have Physician oversight!).   Subjective:   Hannah [name] is a 63 y.o. female presenting today for follow up of  Chief Complaint  Patient presents with   Urticaria    [name] has a history of the following: Patient Active Problem List   Diagnosis Date Noted   Seasonal allergic conjunctivitis 04/30/2023   Allergic reaction to  insect sting 04/30/2023   Allergies 08/23/2022   Hyperlipidemia 06/30/2022   Herpes simplex 11/18/2021   Disorder of skin of trunk 11/16/2021   History of neoplasm 11/16/2021   Lentigo 11/16/2021   Melanocytic nevi of trunk 11/16/2021   Degeneration of lumbar intervertebral disc 11/16/2021   COVID 03/29/2021   Encounter to establish care 02/09/2021   Heart rate problem 02/23/2020   Benzodiazepine dependence (HCC) 06/05/2019   Esophageal dysphagia    Hiatal hernia    Anxiety 11/08/2018   Seasonal and perennial allergic rhinitis 02/26/2018   Displacement of lumbar intervertebral disc without myelopathy 12/28/2016   Low back pain 12/28/2016   Ventral incisional hernia without gangrene    Viral pharyngitis 05/05/2016   Sore throat 05/04/2016   Strep throat 04/19/2016   Chronic urticaria 01/17/2016   Acute cholecystitis 11/10/2015   Cholecystitis    History of colon polyps 10/15/2015   Chronic anxiety 10/05/2015   Diaphragmatic hernia 10/05/2015   Insomnia 10/05/2015   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  Discussed the use  of AI scribe software for clinical note transcription with the patient and/or guardian, who gave verbal consent to proceed.  Hannah [name] is a 63 y.o. female presenting for a follow up visit. She was last seen in September 2024. At that time, we continued with albuterol as needed. For her rhinitis, we  continued with Allegra as well as Flonase. For her hives, we continued with Allegra. EpiPen was renewed for her stinging insect allergy.   Since the last visit, she has done well.   Asthma/Respiratory Symptom History: Her asthma is generally well-controlled, with exacerbations occurring in damp, rainy, or mountainous environments. During a trip to the mountains in November, she required the use of her nebulizer due to increased symptoms. She uses her nebulizer infrequently, approximately once every three months. She has a history of trying Singulair (montelukast) but discontinued it due to night terrors and sleepwalking. She prefers a specific brand of albuterol that she feels is more effective for her.  Allergic Rhinitis Symptom History: She takes Zyrtec and Allegra daily for allergy management and uses famotidine as needed, increasing the dose during hive outbreaks. Her history includes allergy shots as a child, which she outgrew, but she developed allergies again after an insect bite in 2007. [place] and comm[place] less frequently now, which has reduced her car mileage significantly.   Skin Symptom History: She recently experienced a recurrence of hives following a change in her Xolair administration from two 150 mg injections to one 300 mg injection. Hives occurred after the first and second doses of the 300 mg injection. After consulting with a different doctor in September 2024, she reverted to the two 150 mg injections, which resolved the hives. No further outbreaks have occurred since returning to the previous regimen.  Stinging Insect Symptom History: She carries an EpiPen and uses albuterol infrequently, noting that it causes jitteriness.    Otherwise, there have been no changes to her past medical history, surgical history, family history, or social history.    Review of systems otherwise negative other than that mentioned in the HPI.    Objective:   Blood  pressure 130/84, pulse 76, temperature 99.2 F (37.3 C), temperature source Temporal, resp. rate 16, last menstrual period 04/12/2012, SpO2 96%. There is no height or weight on file to calculate BMI.    Physical Exam Vitals reviewed.  Constitutional:      Appearance: She is well-developed.     Comments: Very thankful.  HENT:     Head: Normocephalic and atraumatic.     Right Ear: Tympanic membrane, ear canal and external ear normal.     Left Ear: Tympanic membrane, ear canal and external ear normal.     Nose: No nasal deformity, septal deviation, mucosal edema or rhinorrhea.     Right Turbinates: Not enlarged or swollen.     Left Turbinates: Not enlarged or swollen.     Right Sinus: No maxillary sinus tenderness or frontal sinus tenderness.     Left Sinus: No maxillary sinus tenderness or frontal sinus tenderness.     Comments: No polyps.     Mouth/Throat:     Mouth: Mucous membranes are not pale and not dry.     Pharynx: Uvula midline.  Eyes:     General: Lids are normal. No allergic shiner.       Right eye: No discharge.        Left eye: No discharge.     Conjunctiva/sclera: Conjunctivae normal.  Right eye: Right conjunctiva is not injected. No chemosis.    Left eye: Left conjunctiva is not injected. No chemosis.    Pupils: Pupils are equal, round, and reactive to light.  Cardiovascular:     Rate and Rhythm: Normal rate and regular rhythm.     Heart sounds: Normal heart sounds.  Pulmonary:     Effort: Pulmonary effort is normal. No tachypnea, accessory muscle usage or respiratory distress.     Breath sounds: Normal breath sounds. No wheezing, rhonchi or rales.     Comments: Moving air well in all lung fields.   Chest:     Chest wall: No tenderness.  Lymphadenopathy:     Cervical: No cervical adenopathy.  Skin:    General: Skin is warm.     Capillary Refill: Capillary refill takes less than 2 seconds.     Coloration: Skin is not pale.     Findings: No abrasion,  erythema, petechiae or rash. Rash is not papular, urticarial or vesicular.     Comments: No eczematous or urticarial lesions noted.   Neurological:     Mental Status: She is alert.  Psychiatric:        Behavior: Behavior is cooperative.      Diagnostic studies:  none       Malachi Bonds, MD  [facility]

## 2023-10-30 NOTE — Patient Instructions (Addendum)
 1. Chronic idiopathic urticaria - Continue with Xolair 300mg  every 28 days.  - Continue with Allegra in the morning and Zyrtec at night.   2. Allergic reaction to insect sting - EpiPen is up to date. - Continue to avoid stinging insects as tolerated.  3. Mild intermittent asthma without complication - Lung testing not done today. - We can check that next time.  - You are doing very well with the DuoNeb as needed, but if the use increases, we might need to consider adding a daily controller medication.  - Daily controller medication(s): NONE - Rescue medications: DuoNeb nebulizer one vial every 4-6 hours as needed - Asthma control goals:  * Full participation in all desired activities (may need albuterol before activity) * Albuterol use two time or less a week on average (not counting use with activity) * Cough interfering with sleep two time or less a month * Oral steroids no more than once a year * No hospitalizations  4. Seasonal and perennial allergic rhinitis - Continue with the allergy medications, as above.  5. Return in about 1 year (around 10/29/2024). You can have the follow up appointment with Dr. Dellis Anes or a Nurse Practicioner (our Nurse Practitioners are excellent and always have Physician oversight!).    Please inform us of any Emergency Department visits, hospitalizations, or changes in symptoms. Call us before going to the ED for breathing or allergy symptoms since we might be able to fit you in for a sick visit. Feel free to contact us anytime with any questions, problems, or concerns.  It was a pleasure to see you again today!  Websites that have reliable patient information: 1. American Academy of Asthma, Allergy, and Immunology: www.aaaai.org 2. Food Allergy Research and Education (FARE): foodallergy.org 3. Mothers of Asthmatics: http://www.asthmacommunitynetwork.org 4. American College of Allergy, Asthma, and Immunology: www.acaai.org      "Like" [place] on  Facebook and Instagram for our latest updates!      A healthy democracy works best when [employer] participate! Make sure you are registered to vote! If you have moved or changed any of your contact information, you will need to get this updated before voting! Scan the QR codes below to learn more!

## 2023-11-06 ENCOUNTER — Other Ambulatory Visit: Payer: Self-pay | Admitting: Obstetrics & Gynecology

## 2023-11-06 DIAGNOSIS — Z79890 Hormone replacement therapy (postmenopausal): Secondary | ICD-10-CM

## 2023-11-07 NOTE — Telephone Encounter (Signed)
 Med refill request: HRT Patch Last AEX: 07/14/2022-ML Next AEX: 12/18/2023-BS Last MMG (if hormonal med): 04/20/2023-WNL Refill authorized: rx pend.

## 2023-12-18 ENCOUNTER — Encounter: Payer: Self-pay | Admitting: Obstetrics and Gynecology

## 2023-12-18 ENCOUNTER — Ambulatory Visit (INDEPENDENT_AMBULATORY_CARE_PROVIDER_SITE_OTHER): Payer: No Typology Code available for payment source | Admitting: Obstetrics and Gynecology

## 2023-12-18 VITALS — BP 124/86 | HR 93 | Ht 64.0 in | Wt 178.0 lb

## 2023-12-18 DIAGNOSIS — N952 Postmenopausal atrophic vaginitis: Secondary | ICD-10-CM | POA: Diagnosis not present

## 2023-12-18 DIAGNOSIS — Z79890 Hormone replacement therapy (postmenopausal): Secondary | ICD-10-CM

## 2023-12-18 DIAGNOSIS — Z1331 Encounter for screening for depression: Secondary | ICD-10-CM

## 2023-12-18 DIAGNOSIS — Z01419 Encounter for gynecological examination (general) (routine) without abnormal findings: Secondary | ICD-10-CM

## 2023-12-18 MED ORDER — PROGESTERONE MICRONIZED 100 MG PO CAPS: 100.0000 mg | ORAL_CAPSULE | Freq: Every day | ORAL | 3 refills | Status: AC

## 2023-12-18 MED ORDER — ESTRADIOL 0.05 MG/24HR TD PTTW: 1.0000 | MEDICATED_PATCH | TRANSDERMAL | 3 refills | Status: AC

## 2023-12-18 MED ORDER — ESTRADIOL 10 MCG VA TABS: 1.0000 | ORAL_TABLET | VAGINAL | 3 refills | Status: AC

## 2023-12-18 NOTE — Progress Notes (Signed)
 63 y.o. G58P1021 Married Caucasian female here for annual exam.    On HRT. Hot flashes are controlled.   She is not using Vagifem .   Not sexually active due to pain.   She is not having vaginal bleeding.   Some urinary incontinence with laughing.  Wears a thin pad.  No leak related to urgency.  Likes Myrbetriq  in the past, but insurance stopped paying.   Works as a Counsellor.  2 grandchildren:  6 and 14 you.   PCP: Michal Agar, MD   Patient's last menstrual period was 04/12/2012.           Sexually active: Yes.    The current method of family planning is post menopausal status & vasectomy. Menopausal hormone therapy:  Estradiol  & progesterone   Exercising: Yes.     [employer] Smoker:  Former  OB History  Gravida Para Term Preterm AB Living  3 1 1  2 1   SAB IAB Ectopic Multiple Live Births  2    1    # Outcome Date GA Lbr Len/2nd Weight Sex Type Anes PTL Lv  3 SAB           2 SAB           1 Term     F Vag-Spont  N LIV     HEALTH MAINTENANCE: Last 2 paps:  05/27/21 neg, 01/10/18 HR HPV neg History of abnormal Pap or positive HPV:  no Mammogram:   04/20/23 Breast Density Cat B, BIRADS Cat 1 neg  Colonoscopy:  05/30/19 - due in 2025.  Bone Density:  n/a  Result  n/a   Immunization History  Administered Date(s) Administered   Influenza Split 04/29/2014   Influenza,inj,Quad PF,6+ Mos 05/17/2015, 05/19/2016, 07/10/2017, 06/13/2018, 05/13/2019, 05/02/2022   Influenza-Unspecified 04/29/2014, 04/29/2015, 05/17/2015, 05/19/2016, 06/13/2018, 05/13/2019, 06/28/2020   PFIZER(Purple Top)SARS-COV-2 Vaccination 11/02/2019, 11/23/2019, 05/29/2020, 12/23/2020   Td 05/12/2008   Td (Adult),unspecified 05/12/2008   Tdap 07/10/2017      reports that she quit smoking about 11 years ago. Her smoking use included cigarettes. She has never used smokeless tobacco. She reports that she does not currently use alcohol. She reports that she does not use drugs.  Past Medical  History:  Diagnosis Date   Anxiety    Asthma    allergy induced asthma   Depression    Dysphagia    Environmental allergies    Hernia of abdominal wall    Herpes labialis    History of hiatal hernia    Lumbar herniated disc    Panic attacks    Urinary incontinence    Urticaria     Past Surgical History:  Procedure Laterality Date   ABDOMINAL SURGERY  2001   MYOMECTOMY    BLADDER SUSPENSION  2003   [facility] transobturator pubovaginal sling Dr Isla Mari   BREAST BIOPSY Right 12/21/2010   US    CHOLECYSTECTOMY N/A 11/10/2015   Procedure: LAPAROSCOPIC CHOLECYSTECTOMY;  Surgeon: Claudia Cuff, MD;  Location: [facility];  Service: General;  Laterality: N/A;   COLONOSCOPY  2015   Dr Andra Banco [place]   COLONOSCOPY WITH PROPOFOL  N/A 05/30/2019   Procedure: COLONOSCOPY WITH PROPOFOL ;  Surgeon: Irby Mannan, MD;  Location: [facility];  Service: Endoscopy;  Laterality: N/A;   DILATION AND CURETTAGE OF UTERUS     ESOPHAGOGASTRODUODENOSCOPY (EGD) WITH PROPOFOL  N/A 01/23/2019   Procedure: ESOPHAGOGASTRODUODENOSCOPY (EGD) WITH PROPOFOL ;  Surgeon: Irby Mannan, MD;  Location: [facility];  Service: Endoscopy;  Laterality: N/A;   HYSTEROSCOPY     D&C, HYST X 3   VENTRAL HERNIA REPAIR N/A 12/26/2016   Procedure: HERNIA REPAIR VENTRAL ADULT;  Surgeon: Claudia Cuff, MD;  Location: [facility];  Service: General;  Laterality: N/A;    Current Outpatient Medications  Medication Sig Dispense Refill   acyclovir  (ZOVIRAX ) 200 MG/5ML suspension Take 5 mLs (200 mg total) by mouth 5 (five) times daily. 120 mL 0   ALPRAZolam  (XANAX ) 1 MG tablet Take 0.5-1 mg by mouth 3 (three) times daily as needed for anxiety.      azelastine  (OPTIVAR ) 0.05 % ophthalmic solution Place 1 drop into both eyes 2 (two) times daily as needed (for itchy eyes). 6 mL 5   cetirizine  (ZYRTEC ) 10 MG tablet Take 10 mg by mouth daily.     Cholecalciferol  (VITAMIN D3) 3000 units TABS Take 3,000 Units by mouth  daily.     EPINEPHrine  0.3 mg/0.3 mL IJ SOAJ injection Inject 0.3 mg into the muscle as needed for anaphylaxis. 1 each 2   estradiol  (VIVELLE -DOT) 0.05 MG/24HR patch APPLY 1 PATCH TOPICALLY TO THE SKIN 2 TIMES A WEEK 24 patch 0   famotidine  (PEPCID ) 20 MG tablet Take 1 tablet (20 mg total) by mouth daily. 30 tablet 5   fexofenadine  (ALLEGRA ) 180 MG tablet Take 1 tablet (180 mg total) by mouth daily. 30 tablet 5   FLUoxetine (PROZAC) 20 MG capsule Take 20 mg by mouth daily.     ipratropium-albuterol  (DUONEB) 0.5-2.5 (3) MG/3ML SOLN Use one vial in the nebulizer every 6 hours if needed for cough or wheeze. 120 mL 1   omalizumab  (XOLAIR ) 150 MG/ML prefilled syringe INJECT 2 SYRINGES UNDER THE SKIN EVERY 4 WEEKS 2 mL 11   progesterone  (PROMETRIUM ) 100 MG capsule TAKE 1 CAPSULE(100 MG) BY MOUTH AT BEDTIME 90 capsule 0   VENTOLIN  HFA 108 (90 Base) MCG/ACT inhaler Inhale 2 puffs into the lungs every 4 (four) hours as needed for wheezing or shortness of breath. 18 g 1   Estradiol  10 MCG TABS vaginal tablet Place 1 tablet (10 mcg total) vaginally 2 (two) times a week. May start with 1 tab vaginally HS x 2 weeks, then long term 1 tab vaginally twice a week. (Patient not taking: Reported on 12/18/2023) 24 tablet 4   UNABLE TO FIND GenTeal Tears     Current Facility-Administered Medications  Medication Dose Route Frequency Provider Last Rate Last Admin   omalizumab  (XOLAIR ) injection 300 mg  300 mg Subcutaneous Q28 days Rochester Chuck, MD   300 mg at 07/26/21 1341    ALLERGIES: Dust mite extract, Latex, Clonazepam, Escitalopram, Other, Codeine, and Montelukast  Family History  Problem Relation Age of Onset   Healthy Mother    Myasthenia gravis Father    Alzheimer's disease Father    Myasthenia gravis Brother    Cancer Maternal Uncle        2 MATERNAL UNCLES W COLON CA   Heart disease Maternal Grandmother    Hypertension Maternal Grandmother    Cancer Maternal Grandfather        Lung   Cancer  Paternal Grandfather        Lung   Asthma Neg Hx     Review of Systems  All other systems reviewed and are negative.   PHYSICAL EXAM:  BP 124/86 (BP Location: Left Arm, Patient Position: Sitting, Cuff Size: Normal)   Pulse 93   Ht 5\' 4"  (1.626 m) Comment: with shoes  Wt  178 lb (80.7 kg) Comment: with shoes  LMP 04/12/2012 Comment: having spotting  SpO2 94%   BMI 30.55 kg/m     General appearance: alert, cooperative and appears stated age Head: normocephalic, without obvious abnormality, atraumatic Neck: no adenopathy, supple, symmetrical, trachea midline and thyroid  normal to inspection and palpation Lungs: clear to auscultation bilaterally Breasts: normal appearance, no masses or tenderness, No nipple retraction or dimpling, No nipple discharge or bleeding, No axillary adenopathy Heart: regular rate and rhythm Abdomen: soft, non-tender; no masses, no organomegaly Extremities: extremities normal, atraumatic, no cyanosis or edema Skin: skin color, texture, turgor normal. No rashes or lesions Lymph nodes: cervical, supraclavicular, and axillary nodes normal. Neurologic: grossly normal  Pelvic: External genitalia:  no lesions              No abnormal inguinal nodes palpated.              Urethra:  normal appearing urethra with no masses, tenderness or lesions              Bartholins and Skenes: normal                 Vagina: normal appearing vagina with normal color and discharge, no lesions              Cervix: no lesions              Pap taken: no Bimanual Exam:  Uterus:  4 cm anterior fundal fibroid?  Uterus nontender.               Adnexa: no mass, fullness, tenderness              Rectal exam: yes.  Confirms.              Anus:  normal sphincter tone, no lesions  Chaperone was present for exam:   Cottie Diss, CMA  ASSESSMENT: Well woman visit with gynecologic exam. HRT.  Vaginal atrophy.  FH colon cancer.  PHQ-9: 0 Status post transobturator sling for urinary  incontinence.  Uterine fibroid formation.   PLAN: Mammogram screening discussed. Self breast awareness reviewed. Pap and HRV collected:  no. Due in 2026.  Guidelines for Calcium , Vitamin D , regular exercise program including cardiovascular and weight bearing exercise. Discused WHI and use of HRT which can increase risk of PE, DVT, MI, stroke and breast cancer.  Medication refills:  Vivelle  Dot 0.05 mg twice weekly, Prometrium  100 mg nightly.  Add Vagifem .  Instructed in use.  Labs with PCP.  Follow up:  yearly and prn.    Pelvic US  report reviewed from 07/26/21: T/V images.  Anteverted uterus enlarged with normal shape measured at 11.19 x 6.78 x 5.82 cm.  2 fundal subserous fibroids noted the largest measuring 1.9 x 2.4 cm.  Thin symmetrical endometrial lining measured at 3.8 mm with no obvious mass or thickening seen.  Focal area in the lower uterine segment of the cavity noted with increased vascularity.  Right ovary is atrophic in appearance within normal limits.  Left ovary is not visualized vaginally or abdominally.  No adnexal mass.  Trace amount of free fluid in the right adnexa.  This correlates with her pelvic exam today.

## 2023-12-18 NOTE — Patient Instructions (Signed)

## 2023-12-19 NOTE — Addendum Note (Signed)
 Addended by: Jorie Newness, Colonel Dears E on: 12/19/2023 07:03 PM   Modules accepted: Orders

## 2024-03-12 ENCOUNTER — Other Ambulatory Visit: Payer: Self-pay | Admitting: Family Medicine

## 2024-03-12 DIAGNOSIS — Z1231 Encounter for screening mammogram for malignant neoplasm of breast: Secondary | ICD-10-CM

## 2024-04-21 ENCOUNTER — Ambulatory Visit
Admission: RE | Admit: 2024-04-21 | Discharge: 2024-04-21 | Disposition: A | Discharge status: Home / Self Care | Source: Ambulatory Visit | Attending: Family Medicine | Admitting: Family Medicine

## 2024-04-21 DIAGNOSIS — Z1231 Encounter for screening mammogram for malignant neoplasm of breast: Secondary | ICD-10-CM

## 2024-05-13 ENCOUNTER — Encounter: Payer: Self-pay | Admitting: Allergy & Immunology

## 2024-05-14 MED ORDER — PREDNISONE 10 MG PO TABS: ORAL_TABLET | ORAL | 0 refills | Status: DC

## 2024-05-16 ENCOUNTER — Ambulatory Visit
Admission: RE | Admit: 2024-05-16 | Discharge: 2024-05-16 | Disposition: A | Discharge status: Home / Self Care | Source: Ambulatory Visit | Attending: Emergency Medicine | Admitting: Emergency Medicine

## 2024-05-16 VITALS — BP 147/92 | HR 100 | Temp 97.8°F | Resp 18

## 2024-05-16 DIAGNOSIS — T7840XA Allergy, unspecified, initial encounter: Secondary | ICD-10-CM | POA: Diagnosis not present

## 2024-05-16 MED ORDER — DEXAMETHASONE SODIUM PHOSPHATE 10 MG/ML IJ SOLN
10.0000 mg | Freq: Once | INTRAMUSCULAR | Status: AC
  Administered 2024-05-16: 10 mg via INTRAMUSCULAR

## 2024-05-16 NOTE — ED Triage Notes (Signed)
 Patient to Urgent Care with complaints of allergic reaction to right eyelid.   Symptoms x2 weeks. Flared back up two days ago.   Attempted multiple multiple antihistamines/ received xolair . Patient messaged her allergist and was sent in a prednisone  taper- has taken two doses.

## 2024-05-16 NOTE — ED Provider Notes (Signed)
 [facility]    CSN: [id number] Arrival date & time: 05/16/24  1420      History   Chief Complaint Chief Complaint  Patient presents with   Allergic Reaction    I have taken all of my allergy meds and had my allergy shots last weekend. I started on prednisone  yesterday but I'm continuing to react. I think that I need a shot of depomedrine to stop the reaction. I do have epipens just in case. - Entered by patient    HPI Hannah [name] is a 63 y.o. female.   HPI  63 year old female with past medical history significant for mild intermittent asthma, and idiopathic urticaria presents for evaluation of redness and itching to her right periorbital region that has been going on for the past 2 weeks.  She is currently on Xolair , famotidine , Allegra , and she started a prednisone  taper 2 days ago.  She has contacted her allergist about this issue and has a follow-up appointment with him next week.  Past Medical History:  Diagnosis Date   Anxiety    Asthma    allergy induced asthma   Depression    Dysphagia    Environmental allergies    Hernia of abdominal wall    Herpes labialis    History of hiatal hernia    Lumbar herniated disc    Panic attacks    Urinary incontinence    Urticaria     Patient Active Problem List   Diagnosis Date Noted   Seasonal allergic conjunctivitis 04/30/2023   Allergic reaction to insect sting 04/30/2023   Allergies 08/23/2022   Hyperlipidemia 06/30/2022   Herpes simplex 11/18/2021   Disorder of skin of trunk 11/16/2021   History of neoplasm 11/16/2021   Lentigo 11/16/2021   Melanocytic nevi of trunk 11/16/2021   Degeneration of lumbar intervertebral disc 11/16/2021   COVID 03/29/2021   Encounter to establish care 02/09/2021   Heart rate problem 02/23/2020   Benzodiazepine dependence (HCC) 06/05/2019   Esophageal dysphagia    Hiatal hernia    Anxiety 11/08/2018   Seasonal and perennial allergic rhinitis 02/26/2018   Displacement  of lumbar intervertebral disc without myelopathy 12/28/2016   Low back pain 12/28/2016   Ventral incisional hernia without gangrene    Viral pharyngitis 05/05/2016   Sore throat 05/04/2016   Strep throat 04/19/2016   Chronic urticaria 01/17/2016   Acute cholecystitis 11/10/2015   Cholecystitis    History of colon polyps 10/15/2015   Chronic anxiety 10/05/2015   Diaphragmatic hernia 10/05/2015   Insomnia 10/05/2015   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 10/05/2015   Prinzmetal angina 10/05/2015   Recurrent major depressive disorder 10/05/2015   Chronic idiopathic urticaria 07/12/2015   Mild intermittent asthma without complication 07/12/2015   Detrusor muscle hypertonia 06/02/2015   Vitamin D  deficiency 06/02/2015   Mild persistent asthma 05/17/2015   Other allergic rhinitis 05/17/2015   Idiopathic urticaria 05/17/2015   GERD (gastroesophageal reflux disease) 05/17/2015   Gastroesophageal reflux disease without esophagitis 05/17/2015    Past Surgical History:  Procedure Laterality Date   ABDOMINAL SURGERY  2001   MYOMECTOMY    BLADDER SUSPENSION  2003   [facility] transobturator pubovaginal sling Dr Chales   BREAST BIOPSY Right 12/21/2010   US    CHOLECYSTECTOMY N/A 11/10/2015   Procedure: LAPAROSCOPIC CHOLECYSTECTOMY;  Surgeon: Charlie FORBES Fell, MD;  Location: [facility];  Service: General;  Laterality: N/A;   COLONOSCOPY  2015   Dr Timm [place]   COLONOSCOPY  WITH PROPOFOL  N/A 05/30/2019   Procedure: COLONOSCOPY WITH PROPOFOL ;  Surgeon: Janalyn Keene NOVAK, MD;  Location: [facility];  Service: Endoscopy;  Laterality: N/A;   DILATION AND CURETTAGE OF UTERUS     ESOPHAGOGASTRODUODENOSCOPY (EGD) WITH PROPOFOL  N/A 01/23/2019   Procedure: ESOPHAGOGASTRODUODENOSCOPY (EGD) WITH PROPOFOL ;  Surgeon: Janalyn Keene NOVAK, MD;  Location: [facility];  Service: Endoscopy;  Laterality: N/A;   HYSTEROSCOPY     D&C, HYST X 3   VENTRAL HERNIA REPAIR N/A 12/26/2016    Procedure: HERNIA REPAIR VENTRAL ADULT;  Surgeon: Wonda Charlie BRAVO, MD;  Location: [facility];  Service: General;  Laterality: N/A;    OB History     Gravida  3   Para  1   Term  1   Preterm      AB  2   Living  1      SAB  2   IAB      Ectopic      Multiple      Live Births  1            Home Medications    Prior to Admission medications   Medication Sig Start Date End Date Taking? Authorizing Provider  acyclovir  (ZOVIRAX ) 200 MG/5ML suspension Take 5 mLs (200 mg total) by mouth 5 (five) times daily. 11/15/21   Lavoie, Marie-Lyne, MD  ALPRAZolam  (XANAX ) 1 MG tablet Take 0.5-1 mg by mouth 3 (three) times daily as needed for anxiety.     [provider]  azelastine  (OPTIVAR ) 0.05 % ophthalmic solution Place 1 drop into both eyes 2 (two) times daily as needed (for itchy eyes). 05/28/23   Cari Arlean HERO, FNP  cetirizine  (ZYRTEC ) 10 MG tablet Take 10 mg by mouth daily.    [provider]  Cholecalciferol  (VITAMIN D3) 3000 units TABS Take 3,000 Units by mouth daily.    [provider]  EPINEPHrine  0.3 mg/0.3 mL IJ SOAJ injection Inject 0.3 mg into the muscle as needed for anaphylaxis. 10/30/23   Iva Marty Saltness, MD  Estradiol  (VAGIFEM ) 10 MCG TABS vaginal tablet Place 1 tablet (10 mcg total) vaginally 2 (two) times a week. Use every night before bed for two weeks when you first begin this medicine, then after the first two weeks, begin using it twice a week. 12/20/23   Cathlyn JAYSON Nikki Bobie BRAVO, MD  estradiol  (VIVELLE -DOT) 0.05 MG/24HR patch Place 1 patch (0.05 mg total) onto the skin 2 (two) times a week. 12/20/23   Cathlyn JAYSON Nikki Bobie BRAVO, MD  famotidine  (PEPCID ) 20 MG tablet Take 1 tablet (20 mg total) by mouth daily. 04/30/23   Cari Arlean HERO, FNP  fexofenadine  (ALLEGRA ) 180 MG tablet Take 1 tablet (180 mg total) by mouth daily. 04/30/23   Cari Arlean HERO, FNP  FLUoxetine (PROZAC) 20 MG capsule Take 20 mg by mouth daily.    [provider]   ipratropium-albuterol  (DUONEB) 0.5-2.5 (3) MG/3ML SOLN Use one vial in the nebulizer every 6 hours if needed for cough or wheeze. 10/30/23   Iva Marty Saltness, MD  omalizumab  (XOLAIR ) 150 MG/ML prefilled syringe INJECT 2 SYRINGES UNDER THE SKIN EVERY 4 WEEKS 07/23/23   Cheryl Reusing, FNP  predniSONE  (DELTASONE ) 10 MG tablet Take two tablets (20mg ) twice daily for three days, then one tablet (10mg ) twice daily for three days, then STOP. 05/14/24   Iva Marty Saltness, MD  progesterone  (PROMETRIUM ) 100 MG capsule Take 1 capsule (100 mg total) by mouth daily. 12/18/23   Amundson  JAYSON Nikki Bobie FORBES, MD  VENTOLIN  HFA 108 [phone] Base) MCG/ACT inhaler Inhale 2 puffs into the lungs every 4 (four) hours as needed for wheezing or shortness of breath. 10/30/23   Iva Marty Saltness, MD  medroxyPROGESTERone  (PROVERA ) 2.5 MG tablet Take 1 tablet (2.5 mg total) by mouth daily. 03/25/19 11/20/19  Fontaine, Evalene SQUIBB, MD    Family History Family History  Problem Relation Age of Onset   Healthy Mother    Myasthenia gravis Father    Alzheimer's disease Father    Myasthenia gravis Brother    Cancer Maternal Uncle        2 MATERNAL UNCLES W COLON CA   Heart disease Maternal Grandmother    Hypertension Maternal Grandmother    Cancer Maternal Grandfather        Lung   Cancer Paternal Grandfather        Lung   Asthma Neg Hx     Social History Social History   Tobacco Use   Smoking status: Former    Current packs/day: 0.00    Types: Cigarettes    Quit date: 08/13/2012    Years since quitting: 11.7   Smokeless tobacco: Never  Vaping Use   Vaping status: Never Used  Substance Use Topics   Alcohol use: Not Currently   Drug use: No     Allergies   Dust mite extract, Latex, Clonazepam, Escitalopram, Other, Codeine, and Montelukast   Review of Systems Review of Systems  HENT:  Negative for trouble swallowing and voice change.   Eyes:  Negative for visual disturbance.  Respiratory:  Negative for  chest tightness, shortness of breath, wheezing and stridor.   Skin:  Positive for color change and rash.     Physical Exam Triage Vital Signs ED Triage Vitals  Encounter Vitals Group     BP      Girls Systolic BP Percentile      Girls Diastolic BP Percentile      Boys Systolic BP Percentile      Boys Diastolic BP Percentile      Pulse      Resp      Temp      Temp src      SpO2      Weight      Height      Head Circumference      Peak Flow      Pain Score      Pain Loc      Pain Education      Exclude from Growth Chart    No data found.  Updated Vital Signs BP (!) 147/92   Pulse 100   Temp 97.8 F (36.6 C)   Resp 18   LMP 04/12/2012 Comment: having spotting  SpO2 98%   Visual Acuity Right Eye Distance:   Left Eye Distance:   Bilateral Distance:    Right Eye Near:   Left Eye Near:    Bilateral Near:     Physical Exam Vitals and nursing note reviewed.  Constitutional:      Appearance: Normal appearance. She is not ill-appearing.  HENT:     Head: Normocephalic and atraumatic.     Mouth/Throat:     Mouth: Mucous membranes are moist.     Pharynx: Oropharynx is clear. No oropharyngeal exudate or posterior oropharyngeal erythema.     Comments: Airway is patent Eyes:     General: No scleral icterus.       Right eye: No discharge.  Left eye: No discharge.     Extraocular Movements: Extraocular movements intact.     Conjunctiva/sclera: Conjunctivae normal.     Pupils: Pupils are equal, round, and reactive to light.  Cardiovascular:     Rate and Rhythm: Normal rate and regular rhythm.     Pulses: Normal pulses.     Heart sounds: Normal heart sounds. No murmur heard.    No friction rub. No gallop.  Pulmonary:     Effort: Pulmonary effort is normal.     Breath sounds: Normal breath sounds. No stridor. No wheezing, rhonchi or rales.  Skin:    General: Skin is warm and dry.     Capillary Refill: Capillary refill takes less than 2 seconds.      Findings: Erythema and rash present.  Neurological:     Mental Status: She is alert.      UC Treatments / Results  Labs (all labs ordered are listed, but only abnormal results are displayed) Labs Reviewed - No data to display  EKG   Radiology No results found.  Procedures Procedures (including critical care time)  Medications Ordered in UC Medications - No data to display  Initial Impression / Assessment and Plan / UC Course  I have reviewed the triage vital signs and the nursing notes.  Pertinent labs & imaging results that were available during my care of the patient were reviewed by me and considered in my medical decision making (see chart for details).   Patient is a nontoxic-appearing 63 year old female presenting for evaluation of itching and redness to her right periorbital region that has been going on for the past 2 weeks.  She came in today because while in the shower this morning she developed full body itching but no hives and some tingling to her tongue.  This resolved when she took her prednisone  and oral antihistamines.  She last took her Xolair  injection this past weekend.  She reports that she has had multiple allergy testing but she has never been tested for alpha gal.  She reports that typically when her reaction gets this bad she needs an injection of steroids on top of the taper and that her symptoms improve.  She denies any swelling of her lips or tongue, tightness in throat, or shortness of breath.  Her airway is patent on exam and she has no stridor when auscultating over the trachea.  Her lungs are clear to auscultation all fields.  She has no urticaria or hives elsewhere on her body.  She reports that she typically gets an injection of Depo-Medrol  when these situations arise.  I will order 10 mg of Decadron  to be administered here in clinic and have her continue her prednisone  taper as previously prescribed.  I have advised her that if she develops any swelling  of her lips or tongue, tightness in throat, shortness of breath that she needs to give herself her EpiPen , call 911, and go to the emergency department.   Final Clinical Impressions(s) / UC Diagnoses   Final diagnoses:  Allergic reaction, initial encounter     Discharge Instructions      Continue your prednisone  taper that as previously prescribed as well as your antihistamines.  Keep your follow-up appointment next week with your allergist as previously scheduled.  If you develop any swelling of your lips or tongue, tightness in throat, or shortness of breath you need to give yourself your EpiPen  and call [phone] and go to the ER.     ED  Prescriptions   None    PDMP not reviewed this encounter.   Bernardino Ditch, NP 05/16/24 1452

## 2024-05-16 NOTE — Discharge Instructions (Addendum)
 Continue your prednisone  taper that as previously prescribed as well as your antihistamines.  Keep your follow-up appointment next week with your allergist as previously scheduled.  If you develop any swelling of your lips or tongue, tightness in throat, or shortness of breath you need to give yourself your EpiPen  and call [phone] and go to the ER.

## 2024-05-26 ENCOUNTER — Encounter: Payer: Self-pay | Admitting: Allergy & Immunology

## 2024-05-28 ENCOUNTER — Encounter: Payer: Self-pay | Admitting: Allergy & Immunology

## 2024-05-30 MED ORDER — PREDNISONE 10 MG PO TABS: ORAL_TABLET | ORAL | 0 refills | Status: DC

## 2024-05-30 NOTE — Addendum Note (Signed)
 Addended by: IVA MARTY SALTNESS on: 05/30/2024 04:39 PM   Modules accepted: Orders

## 2024-06-02 NOTE — Telephone Encounter (Signed)
 I think we are going to need to get an updated office visit to get Rhapsido. Even though we can get patient through bridge program it still requires an request for approval with Ins within 90 days and per last note she was doing well on Xolair 

## 2024-06-10 NOTE — Telephone Encounter (Signed)
 Called pt appt schedule for Thursday 11/6 @ 11am

## 2024-06-16 ENCOUNTER — Ambulatory Visit
Admission: RE | Admit: 2024-06-16 | Discharge: 2024-06-16 | Disposition: A | Discharge status: Home / Self Care | Attending: Physician Assistant | Admitting: Physician Assistant

## 2024-06-16 VITALS — BP 153/85 | HR 89 | Temp 98.0°F | Resp 17

## 2024-06-16 DIAGNOSIS — L508 Other urticaria: Secondary | ICD-10-CM | POA: Diagnosis not present

## 2024-06-16 DIAGNOSIS — R051 Acute cough: Secondary | ICD-10-CM

## 2024-06-16 DIAGNOSIS — R0981 Nasal congestion: Secondary | ICD-10-CM | POA: Diagnosis not present

## 2024-06-16 DIAGNOSIS — J069 Acute upper respiratory infection, unspecified: Secondary | ICD-10-CM | POA: Diagnosis not present

## 2024-06-16 LAB — POC SOFIA SARS ANTIGEN FIA: SARS Coronavirus 2 Ag: NEGATIVE

## 2024-06-16 MED ORDER — DEXAMETHASONE SOD PHOSPHATE PF 10 MG/ML IJ SOLN
10.0000 mg | Freq: Once | INTRAMUSCULAR | Status: AC
  Administered 2024-06-16: 10 mg via INTRAMUSCULAR

## 2024-06-16 MED ORDER — FLUTICASONE PROPIONATE 50 MCG/ACT NA SUSP: 2.0000 | Freq: Every day | NASAL | 0 refills | Status: AC

## 2024-06-16 MED ORDER — PROMETHAZINE-DM 6.25-15 MG/5ML PO SYRP: 5.0000 mL | ORAL_SOLUTION | Freq: Four times a day (QID) | ORAL | 0 refills | Status: DC | PRN

## 2024-06-16 NOTE — ED Provider Notes (Signed)
 [facility]    CSN: [id number] Arrival date & time: 06/16/24  1016      History   Chief Complaint Chief Complaint  Patient presents with   Nasal Congestion    Nasal congestion, headache and asthma - Entered by patient    HPI Hannah [name] is a 63 y.o. female with history of anxiety, depression, allergies, asthma, chronic idiopathic urticaria, and hyperlipidemia.  Patient presents for 3 day history of nasal congestion, cough, headaches, and fatigue. Reports having shortness of breath and wheezing yesterday related to asthma. She says she used her inhaler and felt better. No shortness of breath at this time. No fever, ear pain, chest pain, abdominal pain.   She has been taking allegra , zyrtec , famotidine  and is on Xolair  injections for allergies/chronic hives. Not currently using nasal sprays. She recently completed 8 week course of prednisone  2 days ago. She says she is developing a rash and swelling around the right eye today.  Patient has upcoming appointment with allergist in 3 days.  HPI  Past Medical History:  Diagnosis Date   Anxiety    Asthma    allergy induced asthma   Depression    Dysphagia    Environmental allergies    Hernia of abdominal wall    Herpes labialis    History of hiatal hernia    Lumbar herniated disc    Panic attacks    Urinary incontinence    Urticaria     Patient Active Problem List   Diagnosis Date Noted   Seasonal allergic conjunctivitis 04/30/2023   Allergic reaction to insect sting 04/30/2023   Allergies 08/23/2022   Hyperlipidemia 06/30/2022   Herpes simplex 11/18/2021   Disorder of skin of trunk 11/16/2021   History of neoplasm 11/16/2021   Lentigo 11/16/2021   Melanocytic nevi of trunk 11/16/2021   Degeneration of lumbar intervertebral disc 11/16/2021   COVID 03/29/2021   Encounter to establish care 02/09/2021   Heart rate problem 02/23/2020   Benzodiazepine dependence (HCC) 06/05/2019   Esophageal dysphagia     Hiatal hernia    Anxiety 11/08/2018   Seasonal and perennial allergic rhinitis 02/26/2018   Displacement of lumbar intervertebral disc without myelopathy 12/28/2016   Low back pain 12/28/2016   Ventral incisional hernia without gangrene    Viral pharyngitis 05/05/2016   Sore throat 05/04/2016   Strep throat 04/19/2016   Chronic urticaria 01/17/2016   Acute cholecystitis 11/10/2015   Cholecystitis    History of colon polyps 10/15/2015   Chronic anxiety 10/05/2015   Diaphragmatic hernia 10/05/2015   Insomnia 10/05/2015   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 10/05/2015   Prinzmetal angina 10/05/2015   Recurrent major depressive disorder 10/05/2015   Chronic idiopathic urticaria 07/12/2015   Mild intermittent asthma without complication 07/12/2015   Detrusor muscle hypertonia 06/02/2015   Vitamin D  deficiency 06/02/2015   Mild persistent asthma 05/17/2015   Other allergic rhinitis 05/17/2015   Idiopathic urticaria 05/17/2015   GERD (gastroesophageal reflux disease) 05/17/2015   Gastroesophageal reflux disease without esophagitis 05/17/2015    Past Surgical History:  Procedure Laterality Date   ABDOMINAL SURGERY  2001   MYOMECTOMY    BLADDER SUSPENSION  2003   [place] transobturator pubovaginal sling Dr Chales   BREAST BIOPSY Right 12/21/2010   US    CHOLECYSTECTOMY N/A 11/10/2015   Procedure: LAPAROSCOPIC CHOLECYSTECTOMY;  Surgeon: Charlie FORBES Fell, MD;  Location: [facility];  Service: General;  Laterality: N/A;   COLONOSCOPY  2015   Dr Timm  [place]   COLONOSCOPY WITH PROPOFOL  N/A 05/30/2019   Procedure: COLONOSCOPY WITH PROPOFOL ;  Surgeon: Janalyn Keene NOVAK, MD;  Location: [facility];  Service: Endoscopy;  Laterality: N/A;   DILATION AND CURETTAGE OF UTERUS     ESOPHAGOGASTRODUODENOSCOPY (EGD) WITH PROPOFOL  N/A 01/23/2019   Procedure: ESOPHAGOGASTRODUODENOSCOPY (EGD) WITH PROPOFOL ;  Surgeon: Janalyn Keene NOVAK, MD;  Location: [facility];  Service:  Endoscopy;  Laterality: N/A;   HYSTEROSCOPY     D&C, HYST X 3   VENTRAL HERNIA REPAIR N/A 12/26/2016   Procedure: HERNIA REPAIR VENTRAL ADULT;  Surgeon: Wonda Charlie BRAVO, MD;  Location: [facility];  Service: General;  Laterality: N/A;    OB History     Gravida  3   Para  1   Term  1   Preterm      AB  2   Living  1      SAB  2   IAB      Ectopic      Multiple      Live Births  1            Home Medications    Prior to Admission medications   Medication Sig Start Date End Date Taking? Authorizing Provider  fluticasone  (FLONASE ) 50 MCG/ACT nasal spray Place 2 sprays into both nostrils daily. 06/16/24  Yes Arvis Huxley B, PA-C  promethazine -dextromethorphan (PROMETHAZINE -DM) 6.25-15 MG/5ML syrup Take 5 mLs by mouth 4 (four) times daily as needed. 06/16/24  Yes Arvis Huxley B, PA-C  acyclovir  (ZOVIRAX ) 200 MG/5ML suspension Take 5 mLs (200 mg total) by mouth 5 (five) times daily. 11/15/21   Lavoie, Marie-Lyne, MD  ALPRAZolam  (XANAX ) 1 MG tablet Take 0.5-1 mg by mouth 3 (three) times daily as needed for anxiety.     [provider]  azelastine  (OPTIVAR ) 0.05 % ophthalmic solution Place 1 drop into both eyes 2 (two) times daily as needed (for itchy eyes). 05/28/23   Cari Arlean HERO, FNP  cetirizine  (ZYRTEC ) 10 MG tablet Take 10 mg by mouth daily.    [provider]  Cholecalciferol  (VITAMIN D3) 3000 units TABS Take 3,000 Units by mouth daily.    [provider]  EPINEPHrine  0.3 mg/0.3 mL IJ SOAJ injection Inject 0.3 mg into the muscle as needed for anaphylaxis. 10/30/23   Iva Marty Saltness, MD  Estradiol  (VAGIFEM ) 10 MCG TABS vaginal tablet Place 1 tablet (10 mcg total) vaginally 2 (two) times a week. Use every night before bed for two weeks when you first begin this medicine, then after the first two weeks, begin using it twice a week. 12/20/23   Cathlyn JAYSON Nikki Bobie BRAVO, MD  estradiol  (VIVELLE -DOT) 0.05 MG/24HR patch Place 1 patch (0.05 mg total)  onto the skin 2 (two) times a week. 12/20/23   Cathlyn JAYSON Nikki Bobie BRAVO, MD  famotidine  (PEPCID ) 20 MG tablet Take 1 tablet (20 mg total) by mouth daily. 04/30/23   Cari Arlean HERO, FNP  fexofenadine  (ALLEGRA ) 180 MG tablet Take 1 tablet (180 mg total) by mouth daily. 04/30/23   Cari Arlean HERO, FNP  FLUoxetine (PROZAC) 20 MG capsule Take 20 mg by mouth daily.    [provider]  ipratropium-albuterol  (DUONEB) 0.5-2.5 (3) MG/3ML SOLN Use one vial in the nebulizer every 6 hours if needed for cough or wheeze. 10/30/23   Iva Marty Saltness, MD  omalizumab  (XOLAIR ) 150 MG/ML prefilled syringe INJECT 2 SYRINGES UNDER THE SKIN EVERY 4 WEEKS 07/23/23   Cheryl Reusing, FNP  predniSONE  (  DELTASONE ) 10 MG tablet Take one tablet (10mg ) twice daily for six days days, then one tablet (10mg ) once daily for six days, then STOP. 05/30/24   Iva Marty Saltness, MD  progesterone  (PROMETRIUM ) 100 MG capsule Take 1 capsule (100 mg total) by mouth daily. 12/18/23   Cathlyn JAYSON Nikki Bobie FORBES, MD  VENTOLIN  HFA 108 (90 Base) MCG/ACT inhaler Inhale 2 puffs into the lungs every 4 (four) hours as needed for wheezing or shortness of breath. 10/30/23   Iva Marty Saltness, MD  medroxyPROGESTERone  (PROVERA ) 2.5 MG tablet Take 1 tablet (2.5 mg total) by mouth daily. 03/25/19 11/20/19  Fontaine, Evalene SQUIBB, MD    Family History Family History  Problem Relation Age of Onset   Healthy Mother    Myasthenia gravis Father    Alzheimer's disease Father    Myasthenia gravis Brother    Cancer Maternal Uncle        2 MATERNAL UNCLES W COLON CA   Heart disease Maternal Grandmother    Hypertension Maternal Grandmother    Cancer Maternal Grandfather        Lung   Cancer Paternal Grandfather        Lung   Asthma Neg Hx     Social History Social History   Tobacco Use   Smoking status: Former    Current packs/day: 0.00    Types: Cigarettes    Quit date: 08/13/2012    Years since quitting: 11.8   Smokeless tobacco: Never   Vaping Use   Vaping status: Never Used  Substance Use Topics   Alcohol use: Not Currently   Drug use: No     Allergies   Dust mite extract, Latex, Clonazepam, Escitalopram, Other, Codeine, and Montelukast   Review of Systems Review of Systems  Constitutional:  Positive for fatigue. Negative for chills, diaphoresis and fever.  HENT:  Positive for congestion, facial swelling, rhinorrhea and sinus pressure. Negative for ear pain and sore throat.   Respiratory:  Positive for cough. Negative for shortness of breath and wheezing.   Cardiovascular:  Negative for chest pain.  Gastrointestinal:  Negative for abdominal pain, nausea and vomiting.  Musculoskeletal:  Negative for myalgias.  Skin:  Positive for rash.  Neurological:  Positive for headaches. Negative for weakness.  Hematological:  Negative for adenopathy.     Physical Exam Triage Vital Signs ED Triage Vitals [06/16/24 1048]  Encounter Vitals Group     BP (!) 153/85     Girls Systolic BP Percentile      Girls Diastolic BP Percentile      Boys Systolic BP Percentile      Boys Diastolic BP Percentile      Pulse Rate 89     Resp 17     Temp 98 F (36.7 C)     Temp Source Oral     SpO2 98 %     Weight      Height      Head Circumference      Peak Flow      Pain Score 0     Pain Loc      Pain Education      Exclude from Growth Chart    No data found.  Updated Vital Signs BP (!) 153/85 (BP Location: Right Arm)   Pulse 89   Temp 98 F (36.7 C) (Oral)   Resp 17   LMP 04/12/2012 Comment: having spotting  SpO2 98%      Physical Exam Vitals and nursing  note reviewed.  Constitutional:      General: She is not in acute distress.    Appearance: Normal appearance. She is not ill-appearing or toxic-appearing.  HENT:     Head: Normocephalic and atraumatic.     Right Ear: Tympanic membrane, ear canal and external ear normal.     Left Ear: Tympanic membrane and ear canal normal.     Nose: Congestion present.      Mouth/Throat:     Mouth: Mucous membranes are moist.     Pharynx: Oropharynx is clear. Posterior oropharyngeal erythema (mild with PND) present.  Eyes:     General: No scleral icterus.       Right eye: No discharge.        Left eye: No discharge.     Conjunctiva/sclera: Conjunctivae normal.  Cardiovascular:     Rate and Rhythm: Normal rate and regular rhythm.     Heart sounds: Normal heart sounds.  Pulmonary:     Effort: Pulmonary effort is normal. No respiratory distress.     Breath sounds: Normal breath sounds. No wheezing, rhonchi or rales.  Musculoskeletal:     Cervical back: Neck supple.  Skin:    General: Skin is dry.     Findings: Rash (urticarial rash around right eye. Mild swelling right lower eyelid) present.  Neurological:     General: No focal deficit present.     Mental Status: She is alert. Mental status is at baseline.     Motor: No weakness.     Gait: Gait normal.  Psychiatric:        Mood and Affect: Mood normal.        Behavior: Behavior normal.      UC Treatments / Results  Labs (all labs ordered are listed, but only abnormal results are displayed) Labs Reviewed  POC SOFIA SARS ANTIGEN FIA    EKG   Radiology No results found.  Procedures Procedures (including critical care time)  Medications Ordered in UC Medications  dexamethasone  (DECADRON ) injection 10 mg (10 mg Intramuscular Given 06/16/24 1138)    Initial Impression / Assessment and Plan / UC Course  I have reviewed the triage vital signs and the nursing notes.  Pertinent labs & imaging results that were available during my care of the patient were reviewed by me and considered in my medical decision making (see chart for details).   63 y/o female with history of allergies, asthma, and chronic hives presents for 3 day history of fatigue, congestion, cough and headaches. She reports asthma attack yesterday relieved by use of inhaler. No fevers. Taking OTC antihistamines. Recently  completed 8 week course of prednisone  2 days ago. Appointment with allergist in 3 days.   COVID antigen test obtained. Negative.  Viral illness vs allergies. Sent promethazine  DM and Flonase . Continue allergy meds. Supportive care advised.  Chronic urticaria. Rash is developing around right eye. She was given 10 mg IM dexamethasone  in clinic which will hopefully help until she gets to allergist appointment in 3 days.   Reviewed return precautions.   Final Clinical Impressions(s) / UC Diagnoses   Final diagnoses:  Acute cough  Upper respiratory tract infection, unspecified type  Nasal congestion  Chronic urticaria     Discharge Instructions      -Negative COVID test -Viral vs allergies -Keep taking allergy meds -You received dexamethasone  10 mg injection today -I sent cough meds and nasal spray -Keep follow up with allergist in 3 days     ED Prescriptions  Medication Sig Dispense Auth. Provider   promethazine -dextromethorphan (PROMETHAZINE -DM) 6.25-15 MG/5ML syrup Take 5 mLs by mouth 4 (four) times daily as needed. 118 mL Arvis Huxley B, PA-C   fluticasone  (FLONASE ) 50 MCG/ACT nasal spray Place 2 sprays into both nostrils daily. 1 g Arvis Huxley NOVAK, PA-C      PDMP not reviewed this encounter.   Arvis Huxley NOVAK, PA-C 06/16/24 1140

## 2024-06-16 NOTE — ED Triage Notes (Signed)
 Pt c/o sinus pressure, congestion and headache x 3 days. She has taken allergy medication for her symptoms. Pt gave herself a breathing treatment yesterday.

## 2024-06-16 NOTE — Discharge Instructions (Signed)
-  Negative COVID test -Viral vs allergies -Keep taking allergy meds -You received dexamethasone  10 mg injection today -I sent cough meds and nasal spray -Keep follow up with allergist in 3 days

## 2024-06-19 ENCOUNTER — Ambulatory Visit: Admitting: Allergy & Immunology

## 2024-06-19 ENCOUNTER — Other Ambulatory Visit: Payer: Self-pay

## 2024-06-19 ENCOUNTER — Encounter: Payer: Self-pay | Admitting: Allergy & Immunology

## 2024-06-19 VITALS — BP 132/80 | HR 83 | Temp 97.1°F | Resp 18 | Ht 64.17 in | Wt 183.2 lb

## 2024-06-19 DIAGNOSIS — Z7952 Long term (current) use of systemic steroids: Secondary | ICD-10-CM

## 2024-06-19 DIAGNOSIS — L501 Idiopathic urticaria: Secondary | ICD-10-CM | POA: Diagnosis not present

## 2024-06-19 NOTE — Progress Notes (Signed)
 FOLLOW UP  Date of Service/Encounter:  06/19/24   Assessment:   Chronic idiopathic urticaria - needing 4 or 5 courses of prednisone  annually for control (starting Rhapsido)   Gastroesophageal reflux disease   Allergic reaction to insect sting   Mild intermittent asthma without complication   Seasonal and perennial allergic rhinitis    Plan/Recommendations:   1. Chronic idiopathic urticaria - with some overlying contact dermatitis - Sample of Rhapsido twice daily (samples provided today for one month). - We are getting some labs to look at your platelet count (bleeding was a side effect of this medication rarely).  - Continue with Allegra  in the morning and Zyrtec  at night.  - Schedule patch testing in the future (these are placed on a Monday and then Wednesday and Friday. - this will look for sensitivity to chemicals, cosmetics, metals, etc.  - I just ordered a DEXA scan, so they should call to schedule that.   2. Allergic reaction to insect sting - EpiPen  is up to date. - Continue to avoid stinging insects as tolerated.  3. Mild intermittent asthma without complication - Lung testing looks great today.  - Daily controller medication(s): NONE - Rescue medications: DuoNeb nebulizer one vial every 4-6 hours as needed - Asthma control goals:  * Full participation in all desired activities (may need albuterol  before activity) * Albuterol  use two time or less a week on average (not counting use with activity) * Cough interfering with sleep two time or less a month * Oral steroids no more than once a year * No hospitalizations  4. Seasonal and perennial allergic rhinitis - Continue with the allergy medications.  5. Return in about 4 weeks (around 07/17/2024) for [facility] TESTING ([facility]). You can have the follow up appointment with Dr. Iva or a Nurse Practicioner (our Nurse Practitioners are excellent and always have Physician oversight!).    Subjective:   Hannah [name] is a 63 y.o. female presenting today for follow up of  Chief Complaint  Patient presents with   Follow-up    Her to talk to doctor about replacing her xolair     Hannah [name] has a history of the following: Patient Active Problem List   Diagnosis Date Noted   Seasonal allergic conjunctivitis 04/30/2023   Allergic reaction to insect sting 04/30/2023   Allergies 08/23/2022   Hyperlipidemia 06/30/2022   Herpes simplex 11/18/2021   Disorder of skin of trunk 11/16/2021   History of neoplasm 11/16/2021   Lentigo 11/16/2021   Melanocytic nevi of trunk 11/16/2021   Degeneration of lumbar intervertebral disc 11/16/2021   COVID 03/29/2021   Encounter to establish care 02/09/2021   Heart rate problem 02/23/2020   Benzodiazepine dependence (HCC) 06/05/2019   Esophageal dysphagia    Hiatal hernia    Anxiety 11/08/2018   Seasonal and perennial allergic rhinitis 02/26/2018   Displacement of lumbar intervertebral disc without myelopathy 12/28/2016   Low back pain 12/28/2016   Ventral incisional hernia without gangrene    Viral pharyngitis 05/05/2016   Sore throat 05/04/2016   Strep throat 04/19/2016   Chronic urticaria 01/17/2016   Acute cholecystitis 11/10/2015   Cholecystitis    History of colon polyps 10/15/2015   Chronic anxiety 10/05/2015   Diaphragmatic hernia 10/05/2015   Insomnia 10/05/2015   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  Discussed the use of AI scribe software for clinical note transcription with the patient and/or guardian, who gave verbal consent to proceed.  Hannah [name] is a 63 y.o. female presenting for a follow up visit.  We last saw her in March 2025.  At that time, we continue with Xolair  every 28 days as well as Allegra .  For her allergic rhinitis, we continue with allergy medications as needed.  She continue to avoid stinging insects.  She has experienced significant facial swelling for the past two months, describing it as her face swelling up like a 'moon pie'. This began after starting prednisone  treatment, which she has been on for eight weeks. She also received a shot for sinus issues, which provided some relief. Initially, the swelling was all over her face, including both eyes.  Her current medication regimen includes Xolair , Zyrtec , Allegra , and Zantac, which initially worked well for her symptoms but have diminished in effectiveness over time. No bleeding issues have been experienced. Xolair  has worked for years until recently. Likewise, suppressive antihistamines have worked well until they stopped working.   Asthma/Respiratory Symptom History: Her asthma has been relatively well-controlled, though she experienced a flare-up earlier this week, managed with a breathing treatment. She uses a nebulizer rarely and has an albuterol  inhaler for asthma management. She has undergone breathing tests in the past, which were normal.  Skin Symptom History: She has a history of allergies, including a possible nickel allergy, as she experienced a reaction to a necklace in the past. She has had prick testing but not patch testing for allergies.  Her family history includes her father, who had myasthenia gravis, and her half-brother, who has also been diagnosed with the condition. She has a history of severe reactions to stinging insects, requiring her to carry an EpiPen .   Otherwise, there have been no  changes to her past medical history, surgical history, family history, or social history.    Review of systems otherwise negative other than that mentioned in the HPI.    Objective:   Blood pressure 132/80, pulse 83, temperature (!) 97.1 F (36.2 C), temperature source Temporal, resp. rate 18, height 5' 4.17 (1.63 m), weight 183 lb 3.2 oz (83.1 kg), last menstrual period 04/12/2012, SpO2 98%. Body mass index is 31.28 kg/m.    Physical Exam Vitals reviewed.  Constitutional:      Appearance: She is well-developed.     Comments: Very thankful.  HENT:     Head: Normocephalic and atraumatic.     Right Ear: Tympanic membrane, ear canal and external ear normal.     Left Ear: Tympanic membrane, ear canal and external ear normal.     Nose: No nasal deformity, septal deviation, mucosal edema or rhinorrhea.     Right Turbinates: Not enlarged or swollen.     Left Turbinates: Not enlarged or swollen.     Right Sinus: No maxillary sinus tenderness or frontal sinus tenderness.     Left Sinus: No maxillary sinus tenderness or frontal sinus tenderness.     Comments: No polyps.     Mouth/Throat:     Mouth: Mucous membranes are not pale and not dry.     Pharynx:  Uvula midline.  Eyes:     General: Lids are normal. No allergic shiner.       Right eye: No discharge.        Left eye: No discharge.     Conjunctiva/sclera: Conjunctivae normal.     Right eye: Right conjunctiva is not injected. No chemosis.    Left eye: Left conjunctiva is not injected. No chemosis.    Pupils: Pupils are equal, round, and reactive to light.     Comments: She does have some edema around her eyes with some overlying redness. She has some excoriations present as well. Hives are not visible, but she does have excoriations over her body.   Cardiovascular:     Rate and Rhythm: Normal rate and regular rhythm.     Heart sounds: Normal heart sounds.  Pulmonary:     Effort: Pulmonary effort is normal. No tachypnea,  accessory muscle usage or respiratory distress.     Breath sounds: Normal breath sounds. No wheezing, rhonchi or rales.     Comments: Moving air well in all lung fields.   Chest:     Chest wall: No tenderness.  Lymphadenopathy:     Cervical: No cervical adenopathy.  Skin:    General: Skin is warm.     Capillary Refill: Capillary refill takes less than 2 seconds.     Coloration: Skin is not pale.     Findings: No abrasion, erythema, petechiae or rash. Rash is not papular, urticarial or vesicular.     Comments: No eczematous or urticarial lesions noted.   Neurological:     Mental Status: She is alert.  Psychiatric:        Behavior: Behavior is cooperative.      Diagnostic studies: none       Marty Shaggy, MD  [facility] 

## 2024-06-19 NOTE — Patient Instructions (Addendum)
 1. Chronic idiopathic urticaria - with some overlying contact dermatitis - Sample of Rhapsido twice daily (samples provided today for one month). - We are getting some labs to look at your platelet count (bleeding was a side effect of this medication rarely).  - Continue with Allegra  in the morning and Zyrtec  at night.  - Schedule patch testing in the future (these are placed on a Monday and then Wednesday and Friday. - this will look for sensitivity to chemicals, cosmetics, metals, etc.  - I just ordered a DEXA scan, so they should call to schedule that.   2. Allergic reaction to insect sting - EpiPen  is up to date. - Continue to avoid stinging insects as tolerated.  3. Mild intermittent asthma without complication - Lung testing looks great today.  - Daily controller medication(s): NONE - Rescue medications: DuoNeb nebulizer one vial every 4-6 hours as needed - Asthma control goals:  * Full participation in all desired activities (may need albuterol  before activity) * Albuterol  use two time or less a week on average (not counting use with activity) * Cough interfering with sleep two time or less a month * Oral steroids no more than once a year * No hospitalizations  4. Seasonal and perennial allergic rhinitis - Continue with the allergy medications.  5. Return in about 4 weeks (around 07/17/2024) for [facility] TESTING (NAC-80). You can have the follow up appointment with Dr. Iva or a Nurse Practicioner (our Nurse Practitioners are excellent and always have Physician oversight!).    Please inform us  of any Emergency Department visits, hospitalizations, or changes in symptoms. Call us  before going to the ED for breathing or allergy symptoms since we might be able to fit you in for a sick visit. Feel free to contact us  anytime with any questions, problems, or concerns.  It was a pleasure to see you again today!  Websites that have reliable patient information: 1. American Academy of  Asthma, Allergy, and Immunology: www.aaaai.org 2. Food Allergy Research and Education (FARE): foodallergy.org 3. Mothers of Asthmatics: http://www.asthmacommunitynetwork.org 4. American College of Allergy, Asthma, and Immunology: www.acaai.org      "Like" us  on Facebook and Instagram for our latest updates!      A healthy democracy works best when [employer] participate! Make sure you are registered to vote! If you have moved or changed any of your contact information, you will need to get this updated before voting! Scan the QR codes below to learn more!

## 2024-06-20 LAB — CBC WITH DIFFERENTIAL/PLATELET
Basophils Absolute: 0.1 x10E3/uL (ref 0.0–0.2)
Basos: 1 %
EOS (ABSOLUTE): 0.2 x10E3/uL (ref 0.0–0.4)
Eos: 2 %
Hematocrit: 39.8 % (ref 34.0–46.6)
Hemoglobin: 13.3 g/dL (ref 11.1–15.9)
Immature Grans (Abs): 0 x10E3/uL (ref 0.0–0.1)
Immature Granulocytes: 0 %
Lymphocytes Absolute: 2.7 x10E3/uL (ref 0.7–3.1)
Lymphs: 26 %
MCH: 31.6 pg (ref 26.6–33.0)
MCHC: 33.4 g/dL (ref 31.5–35.7)
MCV: 95 fL (ref 79–97)
Monocytes Absolute: 1 x10E3/uL — ABNORMAL HIGH (ref 0.1–0.9)
Monocytes: 10 %
Neutrophils Absolute: 6.5 x10E3/uL (ref 1.4–7.0)
Neutrophils: 61 %
Platelets: 384 x10E3/uL (ref 150–450)
RBC: 4.21 x10E6/uL (ref 3.77–5.28)
RDW: 12.7 % (ref 11.7–15.4)
WBC: 10.6 x10E3/uL (ref 3.4–10.8)

## 2024-06-24 ENCOUNTER — Ambulatory Visit: Payer: Self-pay | Admitting: Allergy & Immunology

## 2024-06-24 ENCOUNTER — Other Ambulatory Visit: Payer: Self-pay | Admitting: *Deleted

## 2024-06-24 MED ORDER — VENTOLIN HFA 108 (90 BASE) MCG/ACT IN AERS: 2.0000 | INHALATION_SPRAY | RESPIRATORY_TRACT | 1 refills | Status: AC | PRN

## 2024-07-02 ENCOUNTER — Telehealth: Payer: Self-pay | Admitting: *Deleted

## 2024-07-02 MED ORDER — RHAPSIDO 25 MG PO TABS: 25.0000 mg | ORAL_TABLET | Freq: Every day | ORAL | 11 refills | Status: DC

## 2024-07-02 NOTE — Telephone Encounter (Signed)
-----   Message from Marty Morton Shaggy sent at 06/23/2024  8:47 AM EST ----- RHAPSIDO! I gave her samples.

## 2024-07-02 NOTE — Telephone Encounter (Signed)
 Called patient and advised approval, copay card and submit to [facility] specialty for Rhapsido

## 2024-07-04 NOTE — Telephone Encounter (Signed)
 Thanks, Tammy! I will check in with her in a few weeks to see how she is feeling.   Marty Shaggy, MD [facility] 

## 2024-07-09 MED ORDER — PREDNISONE 10 MG PO TABS: ORAL_TABLET | ORAL | 0 refills | Status: AC

## 2024-07-09 MED ORDER — TACROLIMUS 0.1 % EX OINT: TOPICAL_OINTMENT | Freq: Two times a day (BID) | CUTANEOUS | 0 refills | Status: AC

## 2024-07-09 NOTE — Addendum Note (Signed)
 Addended by: IVA MARTY SALTNESS on: 07/09/2024 05:06 PM   Modules accepted: Orders

## 2024-07-15 NOTE — Telephone Encounter (Signed)
 I did not see any samples in [place]. Do we have any sometime else?   Marty Shaggy, MD [facility] 

## 2024-07-15 NOTE — Telephone Encounter (Signed)
 I called Beonca to get her scheduled for her [place] 25 and Kaizley stated she still hasn't heard from [facility] in regards to Rapsido.  Maurene states she called the number Tammy gave her and it made her leave a voicemail.  She states she left a voicemail and still hasn't heard anything.  She states she tried to follow up yesterday and still couldn't get an actual person to talk to.  She states she is about to run out and is scared she won't get a refill in time.  Celesta wants to know if there is a different number or can someone else try to call?

## 2024-07-17 ENCOUNTER — Telehealth: Payer: Self-pay | Admitting: Allergy & Immunology

## 2024-07-17 NOTE — Telephone Encounter (Signed)
 Hannah [name] called and stated that the pharmacy needs clarification on her prescription for Remibrutinib (RHAPSIDO) 25 MG TABS [[id number]],  and if she needs to be taking one tablet or two tablets per day.

## 2024-07-18 ENCOUNTER — Other Ambulatory Visit: Payer: Self-pay | Admitting: Family

## 2024-07-18 ENCOUNTER — Telehealth: Payer: Self-pay

## 2024-07-18 MED ORDER — RHAPSIDO 25 MG PO TABS: 25.0000 mg | ORAL_TABLET | Freq: Two times a day (BID) | ORAL | 11 refills | Status: AC

## 2024-07-18 NOTE — Telephone Encounter (Signed)
 It is one tablet twice daily. So two tablets in a 24 hour period.   Marty Shaggy, MD [facility] 

## 2024-07-18 NOTE — Telephone Encounter (Signed)
 Patient called in stating that her Rhapsido was sent in to only take daily but she is taking twice daily. She said the  pharmacy needed clarification on the right instructions. Informed per last AVS she is to take twice daily. Rx has been sent in again.

## 2024-07-18 NOTE — Telephone Encounter (Signed)
 Pharmacy called and I verified Rhapsido should be BID.

## 2024-07-21 NOTE — Telephone Encounter (Signed)
 Called [facility] and gave clarification BID dosing

## 2024-08-24 ENCOUNTER — Other Ambulatory Visit: Payer: Self-pay | Admitting: Family Medicine

## 2024-08-25 ENCOUNTER — Encounter: Admitting: Family Medicine

## 2024-08-27 ENCOUNTER — Encounter: Admitting: Allergy

## 2024-08-29 ENCOUNTER — Encounter: Admitting: Internal Medicine

## 2024-10-30 ENCOUNTER — Ambulatory Visit: Admitting: Allergy & Immunology

## 2024-12-24 ENCOUNTER — Ambulatory Visit: Admitting: Obstetrics and Gynecology
